# Patient Record
Sex: Female | Born: 1940 | Race: White | Hispanic: No | Marital: Married | Smoking: Former smoker
Health system: Southern US, Community
[De-identification: ages and names within clinical notes are randomized; demographics above are authoritative.]

## PROBLEM LIST (undated history)

## (undated) DIAGNOSIS — D509 Iron deficiency anemia, unspecified: Secondary | ICD-10-CM

## (undated) DIAGNOSIS — T4145XA Adverse effect of unspecified anesthetic, initial encounter: Secondary | ICD-10-CM

## (undated) DIAGNOSIS — T8859XA Other complications of anesthesia, initial encounter: Secondary | ICD-10-CM

## (undated) DIAGNOSIS — K449 Diaphragmatic hernia without obstruction or gangrene: Secondary | ICD-10-CM

## (undated) DIAGNOSIS — K922 Gastrointestinal hemorrhage, unspecified: Secondary | ICD-10-CM

## (undated) DIAGNOSIS — I10 Essential (primary) hypertension: Secondary | ICD-10-CM

## (undated) DIAGNOSIS — J45909 Unspecified asthma, uncomplicated: Secondary | ICD-10-CM

## (undated) DIAGNOSIS — F419 Anxiety disorder, unspecified: Secondary | ICD-10-CM

## (undated) HISTORY — PX: OTHER SURGICAL HISTORY: SHX169

## (undated) HISTORY — PX: BACK SURGERY: SHX140

## (undated) HISTORY — PX: PARATHYROIDECTOMY: SHX19

## (undated) HISTORY — PX: BILATERAL CARPAL TUNNEL RELEASE: SHX6508

## (undated) HISTORY — DX: Essential (primary) hypertension: I10

## (undated) HISTORY — PX: ABDOMINAL HYSTERECTOMY: SHX81

## (undated) HISTORY — PX: KNEE ARTHROSCOPY: SUR90

## (undated) HISTORY — DX: Anxiety disorder, unspecified: F41.9

## (undated) HISTORY — DX: Iron deficiency anemia, unspecified: D50.9

---

## 2002-01-17 HISTORY — PX: BREAST BIOPSY: SHX20

## 2005-03-27 ENCOUNTER — Emergency Department: Payer: Self-pay | Admitting: Emergency Medicine

## 2005-07-07 ENCOUNTER — Other Ambulatory Visit: Payer: Self-pay

## 2005-07-08 ENCOUNTER — Inpatient Hospital Stay: Payer: Self-pay | Admitting: Unknown Physician Specialty

## 2005-07-11 ENCOUNTER — Encounter: Payer: Self-pay | Admitting: Internal Medicine

## 2006-09-30 ENCOUNTER — Inpatient Hospital Stay: Payer: Self-pay | Admitting: Internal Medicine

## 2008-05-13 ENCOUNTER — Emergency Department: Payer: Self-pay | Admitting: Emergency Medicine

## 2009-06-12 ENCOUNTER — Ambulatory Visit: Payer: Self-pay | Admitting: Unknown Physician Specialty

## 2009-06-18 ENCOUNTER — Ambulatory Visit: Payer: Self-pay | Admitting: Unknown Physician Specialty

## 2009-06-20 ENCOUNTER — Ambulatory Visit: Payer: Self-pay | Admitting: Unknown Physician Specialty

## 2009-07-03 ENCOUNTER — Ambulatory Visit: Payer: Self-pay | Admitting: Unknown Physician Specialty

## 2010-06-10 ENCOUNTER — Emergency Department: Payer: Self-pay | Admitting: Emergency Medicine

## 2010-10-29 ENCOUNTER — Ambulatory Visit: Payer: Self-pay

## 2010-11-26 ENCOUNTER — Ambulatory Visit: Payer: Self-pay | Admitting: Pain Medicine

## 2010-12-09 ENCOUNTER — Ambulatory Visit: Payer: Self-pay | Admitting: Pain Medicine

## 2010-12-23 ENCOUNTER — Ambulatory Visit: Payer: Self-pay | Admitting: Pain Medicine

## 2012-02-11 ENCOUNTER — Inpatient Hospital Stay: Payer: Self-pay | Admitting: Internal Medicine

## 2012-02-11 LAB — COMPREHENSIVE METABOLIC PANEL
Alkaline Phosphatase: 77 U/L (ref 50–136)
Anion Gap: 9 (ref 7–16)
BUN: 15 mg/dL (ref 7–18)
Bilirubin,Total: 0.4 mg/dL (ref 0.2–1.0)
Chloride: 109 mmol/L — ABNORMAL HIGH (ref 98–107)
Creatinine: 0.91 mg/dL (ref 0.60–1.30)
EGFR (African American): >60
Osmolality: 287 (ref 275–301)
Potassium: 3.3 mmol/L — ABNORMAL LOW (ref 3.5–5.1)
SGOT(AST): 68 U/L — ABNORMAL HIGH (ref 15–37)
SGPT (ALT): 43 U/L
Sodium: 144 mmol/L (ref 136–145)

## 2012-02-11 LAB — CBC
HCT: 35.6 % (ref 35.0–47.0)
MCH: 30.4 pg (ref 26.0–34.0)
MCHC: 33.6 g/dL (ref 32.0–36.0)
Platelet: 152 10*3/uL (ref 150–440)
RBC: 3.94 10*6/uL (ref 3.80–5.20)
RDW: 14.1 % (ref 11.5–14.5)

## 2012-02-11 LAB — PRO B NATRIURETIC PEPTIDE: B-Type Natriuretic Peptide: 934 pg/mL — ABNORMAL HIGH (ref 0–125)

## 2012-02-11 LAB — CK TOTAL AND CKMB (NOT AT ARMC): CK-MB: 6.4 ng/mL — ABNORMAL HIGH (ref 0.5–3.6)

## 2012-02-11 LAB — TROPONIN I: Troponin-I: <0.02 ng/mL

## 2012-02-13 LAB — CBC WITH DIFFERENTIAL/PLATELET
Basophil #: 0 10*3/uL (ref 0.0–0.1)
Basophil %: 0.1 %
Eosinophil #: 0 10*3/uL (ref 0.0–0.7)
Eosinophil %: 0 %
HGB: 11 g/dL — ABNORMAL LOW (ref 12.0–16.0)
Lymphocyte #: 0.7 10*3/uL — ABNORMAL LOW (ref 1.0–3.6)
Lymphocyte %: 12.3 %
MCH: 29.4 pg (ref 26.0–34.0)
MCHC: 32.5 g/dL (ref 32.0–36.0)
MCV: 91 fL (ref 80–100)
Monocyte %: 5.5 %
Neutrophil #: 4.6 10*3/uL (ref 1.4–6.5)
Neutrophil %: 82.1 %
RBC: 3.75 10*6/uL — ABNORMAL LOW (ref 3.80–5.20)
RDW: 13.9 % (ref 11.5–14.5)
WBC: 5.6 10*3/uL (ref 3.6–11.0)

## 2012-02-13 LAB — BASIC METABOLIC PANEL
BUN: 23 mg/dL — ABNORMAL HIGH (ref 7–18)
Chloride: 108 mmol/L — ABNORMAL HIGH (ref 98–107)
Glucose: 132 mg/dL — ABNORMAL HIGH (ref 65–99)
Osmolality: 291 (ref 275–301)
Potassium: 3.9 mmol/L (ref 3.5–5.1)

## 2012-02-15 LAB — CK-MB: CK-MB: 4.3 ng/mL — ABNORMAL HIGH (ref 0.5–3.6)

## 2012-02-15 LAB — SGOT (AST)(ARMC): SGOT(AST): 34 U/L (ref 15–37)

## 2012-02-17 LAB — CULTURE, BLOOD (SINGLE)

## 2012-02-17 LAB — HEMOGLOBIN A1C

## 2012-02-17 LAB — CREATININE, SERUM
Creatinine: 0.92 mg/dL (ref 0.60–1.30)
EGFR (African American): >60
EGFR (Non-African Amer.): >60

## 2012-02-17 LAB — CK-MB: CK-MB: 3.4 ng/mL (ref 0.5–3.6)

## 2012-02-19 LAB — EXPECTORATED SPUTUM ASSESSMENT W GRAM STAIN, RFLX TO RESP C

## 2012-03-09 ENCOUNTER — Ambulatory Visit: Payer: Self-pay | Admitting: Family Medicine

## 2012-04-05 ENCOUNTER — Ambulatory Visit: Payer: Self-pay | Admitting: Internal Medicine

## 2012-04-28 ENCOUNTER — Ambulatory Visit: Payer: Self-pay | Admitting: General Surgery

## 2012-04-28 HISTORY — PX: COLONOSCOPY: SHX174

## 2012-04-28 HISTORY — PX: UPPER GI ENDOSCOPY: SHX6162

## 2012-10-14 ENCOUNTER — Ambulatory Visit: Payer: Self-pay | Admitting: Family Medicine

## 2012-11-22 ENCOUNTER — Ambulatory Visit: Payer: Self-pay | Admitting: Family Medicine

## 2012-11-30 ENCOUNTER — Ambulatory Visit: Payer: Self-pay | Admitting: Family Medicine

## 2013-03-18 ENCOUNTER — Emergency Department: Payer: Self-pay | Admitting: Emergency Medicine

## 2013-03-18 LAB — BASIC METABOLIC PANEL
Anion Gap: 6 — ABNORMAL LOW (ref 7–16)
BUN: 15 mg/dL (ref 7–18)
Calcium, Total: 9.2 mg/dL (ref 8.5–10.1)
Co2: 27 mmol/L (ref 21–32)
EGFR (African American): >60
EGFR (Non-African Amer.): >60
Glucose: 87 mg/dL (ref 65–99)
Osmolality: 278 (ref 275–301)
Potassium: 4.1 mmol/L (ref 3.5–5.1)
Sodium: 139 mmol/L (ref 136–145)

## 2013-03-18 LAB — CBC
MCH: 30.4 pg (ref 26.0–34.0)
Platelet: 224 10*3/uL (ref 150–440)

## 2013-11-15 ENCOUNTER — Ambulatory Visit: Payer: Self-pay | Admitting: Family Medicine

## 2013-12-13 ENCOUNTER — Emergency Department: Payer: Self-pay | Admitting: Emergency Medicine

## 2013-12-13 LAB — CBC WITH DIFFERENTIAL/PLATELET
Basophil #: 0 10*3/uL (ref 0.0–0.1)
Basophil %: 0.6 %
EOS PCT: 0.4 %
Eosinophil #: 0 10*3/uL (ref 0.0–0.7)
HCT: 41 % (ref 35.0–47.0)
HGB: 14.1 g/dL (ref 12.0–16.0)
LYMPHS ABS: 0.3 10*3/uL — AB (ref 1.0–3.6)
Lymphocyte %: 3.1 %
MCH: 29.7 pg (ref 26.0–34.0)
MCHC: 34.3 g/dL (ref 32.0–36.0)
MCV: 87 fL (ref 80–100)
MONO ABS: 0.2 x10 3/mm (ref 0.2–0.9)
Monocyte %: 2.9 %
NEUTROS ABS: 7.5 10*3/uL — AB (ref 1.4–6.5)
Neutrophil %: 93 %
Platelet: 195 10*3/uL (ref 150–440)
RBC: 4.74 10*6/uL (ref 3.80–5.20)
RDW: 14 % (ref 11.5–14.5)
WBC: 8.1 10*3/uL (ref 3.6–11.0)

## 2013-12-13 LAB — URINALYSIS, COMPLETE
BACTERIA: NONE SEEN
Bilirubin,UR: NEGATIVE
Blood: NEGATIVE
Glucose,UR: NEGATIVE mg/dL (ref 0–75)
Leukocyte Esterase: NEGATIVE
NITRITE: NEGATIVE
PH: 5 (ref 4.5–8.0)
PROTEIN: NEGATIVE
RBC,UR: 3 /HPF (ref 0–5)
Specific Gravity: 1.025 (ref 1.003–1.030)
WBC UR: 1 /HPF (ref 0–5)

## 2013-12-13 LAB — COMPREHENSIVE METABOLIC PANEL
ALBUMIN: 3.9 g/dL (ref 3.4–5.0)
Alkaline Phosphatase: 95 U/L
Anion Gap: 6 — ABNORMAL LOW (ref 7–16)
BUN: 26 mg/dL — AB (ref 7–18)
Bilirubin,Total: 0.9 mg/dL (ref 0.2–1.0)
CALCIUM: 9.1 mg/dL (ref 8.5–10.1)
Chloride: 103 mmol/L (ref 98–107)
Co2: 28 mmol/L (ref 21–32)
Creatinine: 1.06 mg/dL (ref 0.60–1.30)
EGFR (African American): 60 — ABNORMAL LOW
EGFR (Non-African Amer.): 52 — ABNORMAL LOW
Glucose: 115 mg/dL — ABNORMAL HIGH (ref 65–99)
Osmolality: 279 (ref 275–301)
Potassium: 3.9 mmol/L (ref 3.5–5.1)
SGOT(AST): 26 U/L (ref 15–37)
SGPT (ALT): 15 U/L (ref 12–78)
SODIUM: 137 mmol/L (ref 136–145)
TOTAL PROTEIN: 7.8 g/dL (ref 6.4–8.2)

## 2013-12-13 LAB — LIPASE, BLOOD: Lipase: 160 U/L (ref 73–393)

## 2014-02-15 ENCOUNTER — Emergency Department: Payer: Self-pay | Admitting: Emergency Medicine

## 2014-04-12 ENCOUNTER — Emergency Department: Payer: Self-pay | Admitting: Emergency Medicine

## 2014-04-12 LAB — CBC
HCT: 38 % (ref 35.0–47.0)
HGB: 12.5 g/dL (ref 12.0–16.0)
MCH: 26.6 pg (ref 26.0–34.0)
MCHC: 32.8 g/dL (ref 32.0–36.0)
MCV: 81 fL (ref 80–100)
Platelet: 230 10*3/uL (ref 150–440)
RBC: 4.7 10*6/uL (ref 3.80–5.20)
RDW: 16.1 % — AB (ref 11.5–14.5)
WBC: 4.9 10*3/uL (ref 3.6–11.0)

## 2014-04-12 LAB — COMPREHENSIVE METABOLIC PANEL
ALK PHOS: 94 U/L
Albumin: 3.7 g/dL (ref 3.4–5.0)
Anion Gap: 7 (ref 7–16)
BUN: 15 mg/dL (ref 7–18)
Bilirubin,Total: 0.5 mg/dL (ref 0.2–1.0)
CHLORIDE: 106 mmol/L (ref 98–107)
CO2: 28 mmol/L (ref 21–32)
Calcium, Total: 9.3 mg/dL (ref 8.5–10.1)
Creatinine: 0.92 mg/dL (ref 0.60–1.30)
EGFR (Non-African Amer.): >60
GLUCOSE: 90 mg/dL (ref 65–99)
OSMOLALITY: 282 (ref 275–301)
POTASSIUM: 3.7 mmol/L (ref 3.5–5.1)
SGOT(AST): 21 U/L (ref 15–37)
SGPT (ALT): 18 U/L (ref 12–78)
SODIUM: 141 mmol/L (ref 136–145)
Total Protein: 7.6 g/dL (ref 6.4–8.2)

## 2014-04-12 LAB — CK TOTAL AND CKMB (NOT AT ARMC)
CK, TOTAL: 129 U/L
CK-MB: 5.8 ng/mL — ABNORMAL HIGH (ref 0.5–3.6)

## 2014-04-12 LAB — TROPONIN I

## 2014-04-13 DIAGNOSIS — S52513A Displaced fracture of unspecified radial styloid process, initial encounter for closed fracture: Secondary | ICD-10-CM | POA: Insufficient documentation

## 2014-11-23 ENCOUNTER — Emergency Department: Payer: Self-pay | Admitting: Emergency Medicine

## 2015-02-05 ENCOUNTER — Emergency Department: Payer: Self-pay | Admitting: Emergency Medicine

## 2015-02-05 LAB — COMPREHENSIVE METABOLIC PANEL
ALBUMIN: 3.9 g/dL
AST: 21 U/L
Alkaline Phosphatase: 76 U/L
Anion Gap: 10 (ref 7–16)
BUN: 15 mg/dL
Bilirubin,Total: 0.8 mg/dL
CALCIUM: 9 mg/dL
CO2: 23 mmol/L
Chloride: 106 mmol/L
Creatinine: 0.99 mg/dL
EGFR (African American): >60
EGFR (Non-African Amer.): 56 — ABNORMAL LOW
GLUCOSE: 111 mg/dL — AB
Potassium: 3.9 mmol/L
SGPT (ALT): 17 U/L
Sodium: 139 mmol/L
Total Protein: 7.2 g/dL

## 2015-02-05 LAB — URINALYSIS, COMPLETE
BLOOD: NEGATIVE
Bilirubin,UR: NEGATIVE
Glucose,UR: NEGATIVE mg/dL (ref 0–75)
Hyaline Cast: 10
LEUKOCYTE ESTERASE: NEGATIVE
Nitrite: NEGATIVE
PH: 5 (ref 4.5–8.0)
Specific Gravity: 1.024 (ref 1.003–1.030)
Squamous Epithelial: 10
WBC UR: 6 /HPF (ref 0–5)

## 2015-02-05 LAB — TROPONIN I

## 2015-02-05 LAB — MAGNESIUM: Magnesium: 2 mg/dL

## 2015-02-09 ENCOUNTER — Inpatient Hospital Stay
Admit: 2015-02-09 | Disposition: A | Discharge status: Home / Self Care | Payer: Self-pay | Attending: Internal Medicine | Admitting: Internal Medicine

## 2015-02-09 LAB — CBC
HCT: 22.5 % — AB (ref 35.0–47.0)
HGB: 6.2 g/dL — ABNORMAL LOW (ref 12.0–16.0)
MCH: 16 pg — ABNORMAL LOW (ref 26.0–34.0)
MCHC: 27.6 g/dL — ABNORMAL LOW (ref 32.0–36.0)
MCV: 58 fL — AB (ref 80–100)
PLATELETS: 318 10*3/uL (ref 150–440)
RBC: 3.88 10*6/uL (ref 3.80–5.20)
RDW: 20.6 % — ABNORMAL HIGH (ref 11.5–14.5)
WBC: 4.8 10*3/uL (ref 3.6–11.0)

## 2015-02-09 LAB — IRON AND TIBC
Iron Bind.Cap.(Total): 404 (ref 250–450)
Iron Saturation: 1.5
Iron: 6 ug/dL — ABNORMAL LOW
UNBOUND IRON-BIND. CAP.: 398.2

## 2015-02-09 LAB — COMPREHENSIVE METABOLIC PANEL
ALK PHOS: 68 U/L
ALT: 12 U/L — AB
ANION GAP: 7 (ref 7–16)
Albumin: 3.6 g/dL
BUN: 17 mg/dL
Bilirubin,Total: 0.4 mg/dL
CO2: 24 mmol/L
CREATININE: 0.97 mg/dL
Calcium, Total: 9 mg/dL
Chloride: 110 mmol/L
EGFR (African American): >60
EGFR (Non-African Amer.): 57 — ABNORMAL LOW
Glucose: 97 mg/dL
Potassium: 4.2 mmol/L
SGOT(AST): 16 U/L
Sodium: 141 mmol/L
Total Protein: 6.7 g/dL

## 2015-02-09 LAB — URINALYSIS, COMPLETE
BACTERIA: NONE SEEN
Bilirubin,UR: NEGATIVE
Blood: NEGATIVE
Glucose,UR: NEGATIVE mg/dL (ref 0–75)
KETONE: NEGATIVE
NITRITE: NEGATIVE
PROTEIN: NEGATIVE
Ph: 6 (ref 4.5–8.0)
RBC, UR: NONE SEEN /HPF (ref 0–5)
Specific Gravity: 1.011 (ref 1.003–1.030)

## 2015-02-09 LAB — FERRITIN: Ferritin (ARMC): 3 ng/mL — ABNORMAL LOW

## 2015-02-10 DIAGNOSIS — K5730 Diverticulosis of large intestine without perforation or abscess without bleeding: Secondary | ICD-10-CM

## 2015-02-10 LAB — CBC WITH DIFFERENTIAL/PLATELET
BASOS ABS: 0.1 10*3/uL (ref 0.0–0.1)
BASOS PCT: 1.7 %
BASOS PCT: 1.7 %
Basophil #: 0.1 10*3/uL (ref 0.0–0.1)
Basophil #: 0.1 10*3/uL (ref 0.0–0.1)
Basophil %: 2 %
EOS ABS: 0.1 10*3/uL (ref 0.0–0.7)
EOS ABS: 0.1 10*3/uL (ref 0.0–0.7)
EOS ABS: 0.1 10*3/uL (ref 0.0–0.7)
Eosinophil %: 2.3 %
Eosinophil %: 2.9 %
Eosinophil %: 1.9 %
HCT: 24 % — ABNORMAL LOW (ref 35.0–47.0)
HCT: 25.1 % — AB (ref 35.0–47.0)
HCT: 26.7 % — ABNORMAL LOW (ref 35.0–47.0)
HGB: 7.4 g/dL — ABNORMAL LOW (ref 12.0–16.0)
HGB: 7.8 g/dL — AB (ref 12.0–16.0)
HGB: 7.9 g/dL — ABNORMAL LOW (ref 12.0–16.0)
LYMPHS ABS: 1 10*3/uL (ref 1.0–3.6)
LYMPHS ABS: 1.3 10*3/uL (ref 1.0–3.6)
LYMPHS PCT: 24.3 %
Lymphocyte #: 1 10*3/uL (ref 1.0–3.6)
Lymphocyte %: 27.6 %
Lymphocyte %: 25.9 %
MCH: 18.5 pg — ABNORMAL LOW (ref 26.0–34.0)
MCH: 19.2 pg — AB (ref 26.0–34.0)
MCH: 19.4 pg — AB (ref 26.0–34.0)
MCHC: 29.7 g/dL — ABNORMAL LOW (ref 32.0–36.0)
MCHC: 30.6 g/dL — AB (ref 32.0–36.0)
MCHC: 31 g/dL — AB (ref 32.0–36.0)
MCV: 62 fL — ABNORMAL LOW (ref 80–100)
MCV: 63 fL — AB (ref 80–100)
MCV: 63 fL — AB (ref 80–100)
MONO ABS: 0.3 x10 3/mm (ref 0.2–0.9)
MONO ABS: 0.4 x10 3/mm (ref 0.2–0.9)
MONOS PCT: 7 %
Monocyte #: 0.3 x10 3/mm (ref 0.2–0.9)
Monocyte %: 9 %
Monocyte %: 7.9 %
NEUTROS ABS: 2.3 10*3/uL (ref 1.4–6.5)
NEUTROS PCT: 65.1 %
Neutrophil #: 2.9 10*3/uL (ref 1.4–6.5)
Neutrophil #: 2.6 10*3/uL (ref 1.4–6.5)
Neutrophil %: 59.1 %
Neutrophil %: 61.6 %
PLATELETS: 259 10*3/uL (ref 150–440)
Platelet: 261 10*3/uL (ref 150–440)
Platelet: 282 10*3/uL (ref 150–440)
RBC: 3.84 10*6/uL (ref 3.80–5.20)
RBC: 4.01 10*6/uL (ref 3.80–5.20)
RBC: 4.3 10*6/uL (ref 3.80–5.20)
RDW: 27.4 % — ABNORMAL HIGH (ref 11.5–14.5)
RDW: 27.6 % — ABNORMAL HIGH (ref 11.5–14.5)
RDW: 27 % — ABNORMAL HIGH (ref 11.5–14.5)
WBC: 4.9 10*3/uL (ref 3.6–11.0)
WBC: 3.7 10*3/uL (ref 3.6–11.0)
WBC: 4.1 10*3/uL (ref 3.6–11.0)

## 2015-02-10 LAB — BASIC METABOLIC PANEL
Anion Gap: 3 — ABNORMAL LOW (ref 7–16)
BUN: 10 mg/dL
CHLORIDE: 110 mmol/L
CO2: 26 mmol/L
Calcium, Total: 8.5 mg/dL — ABNORMAL LOW
Creatinine: 0.98 mg/dL
EGFR (Non-African Amer.): 56 — ABNORMAL LOW
GLUCOSE: 101 mg/dL — AB
Potassium: 3.8 mmol/L
Sodium: 139 mmol/L

## 2015-02-11 LAB — HEMOGLOBIN: HGB: 8.1 g/dL — ABNORMAL LOW (ref 12.0–16.0)

## 2015-02-12 ENCOUNTER — Encounter: Payer: Self-pay | Admitting: *Deleted

## 2015-02-12 DIAGNOSIS — K5730 Diverticulosis of large intestine without perforation or abscess without bleeding: Secondary | ICD-10-CM | POA: Diagnosis not present

## 2015-02-12 HISTORY — PX: COLONOSCOPY: SHX174

## 2015-02-12 LAB — HEMOGLOBIN: HGB: 9.9 g/dL — ABNORMAL LOW (ref 12.0–16.0)

## 2015-02-13 ENCOUNTER — Ambulatory Visit: Admit: 2015-02-13 | Payer: Self-pay | Admitting: Internal Medicine

## 2015-02-13 LAB — BASIC METABOLIC PANEL WITH GFR
Anion Gap: 7
BUN: 15 mg/dL
Calcium, Total: 8.6 mg/dL — ABNORMAL LOW
Chloride: 104 mmol/L
Co2: 28 mmol/L
Creatinine: 1.12 mg/dL — ABNORMAL HIGH
EGFR (African American): 56 — ABNORMAL LOW
EGFR (Non-African Amer.): 48 — ABNORMAL LOW
Glucose: 90 mg/dL
Potassium: 3.4 mmol/L — ABNORMAL LOW
Sodium: 139 mmol/L

## 2015-02-13 LAB — CBC WITH DIFFERENTIAL/PLATELET
Basophil #: 0 10*3/uL (ref 0.0–0.1)
Basophil %: 1.2 %
EOS PCT: 3.9 %
Eosinophil #: 0.1 10*3/uL (ref 0.0–0.7)
HCT: 25.8 % — ABNORMAL LOW (ref 35.0–47.0)
HGB: 7.7 g/dL — AB (ref 12.0–16.0)
LYMPHS ABS: 1.3 10*3/uL (ref 1.0–3.6)
Lymphocyte %: 37.5 %
MCH: 19 pg — AB (ref 26.0–34.0)
MCHC: 29.8 g/dL — ABNORMAL LOW (ref 32.0–36.0)
MCV: 64 fL — AB (ref 80–100)
MONOS PCT: 8.1 %
Monocyte #: 0.3 x10 3/mm (ref 0.2–0.9)
NEUTROS PCT: 49.3 %
Neutrophil #: 1.8 10*3/uL (ref 1.4–6.5)
Platelet: 260 10*3/uL (ref 150–440)
RBC: 4.03 10*6/uL (ref 3.80–5.20)
RDW: 28.6 % — ABNORMAL HIGH (ref 11.5–14.5)
WBC: 3.6 10*3/uL (ref 3.6–11.0)

## 2015-02-16 ENCOUNTER — Ambulatory Visit
Admit: 2015-02-16 | Disposition: A | Discharge status: Home / Self Care | Payer: Self-pay | Attending: General Surgery | Admitting: General Surgery

## 2015-02-19 ENCOUNTER — Encounter: Payer: Self-pay | Admitting: General Surgery

## 2015-02-20 ENCOUNTER — Encounter: Payer: Self-pay | Admitting: General Surgery

## 2015-02-21 ENCOUNTER — Encounter: Payer: Self-pay | Admitting: General Surgery

## 2015-02-21 ENCOUNTER — Ambulatory Visit (INDEPENDENT_AMBULATORY_CARE_PROVIDER_SITE_OTHER): Payer: Commercial Managed Care - HMO | Admitting: General Surgery

## 2015-02-21 VITALS — BP 126/82 | HR 88 | Resp 16 | Ht 63.0 in | Wt 185.0 lb

## 2015-02-21 DIAGNOSIS — J209 Acute bronchitis, unspecified: Secondary | ICD-10-CM

## 2015-02-21 DIAGNOSIS — D649 Anemia, unspecified: Secondary | ICD-10-CM

## 2015-02-21 MED ORDER — AZITHROMYCIN 250 MG PO TABS: ORAL_TABLET | ORAL | Status: DC

## 2015-02-21 NOTE — Progress Notes (Signed)
Patient ID: Amanda [name], female   DOB: May 17, 1941, 74 y.o.   MRN: [id number]  Chief Complaint  Patient presents with  . Follow-up    HPI Amanda [name] is a 74 y.o. female here today for here follow up from her hospital stay. Patient is having some constipation but dulcolax helped. No nausea or vomiting. Denies abdominal pain. She just feels tired and weak. Denies trouble swallowing. She states she has a productive cough (green) and using mucinex.  HPI  Past Medical History  Diagnosis Date  . Hypertension   . Anxiety     Past Surgical History  Procedure Laterality Date  . Colonoscopy  04/28/2012  . Breast biopsy Left 01/17/2002  . Upper gi endoscopy  04/28/2012  . Colonoscopy  02-12-15    Dr Lemar Livings    No family history on file.  Social History History  Substance Use Topics  . Smoking status: Former Smoker -- 1.00 packs/day for 30 years    Types: Cigarettes  . Smokeless tobacco: Never Used  . Alcohol Use: No    Allergies  Allergen Reactions  . Codeine Nausea And Vomiting  . Penicillins Hives    Current Outpatient Prescriptions  Medication Sig Dispense Refill  . albuterol (PROVENTIL HFA;VENTOLIN HFA) 108 (90 BASE) MCG/ACT inhaler Inhale into the lungs every 6 (six) hours as needed for wheezing or shortness of breath.    . ALPRAZolam (XANAX) 1 MG tablet     . amLODipine (NORVASC) 5 MG tablet     . ascorbic acid (VITAMIN C) 1000 MG tablet Take 1,000 mg by mouth daily.    . carvedilol (COREG) 6.25 MG tablet     . cholecalciferol (VITAMIN D) 1000 UNITS tablet Take 1,000 Units by mouth daily.    . cyclobenzaprine (FLEXERIL) 10 MG tablet Take 10 mg by mouth 3 (three) times daily as needed for muscle spasms.    . diazepam (VALIUM) 5 MG tablet Take 5 mg by mouth 3 (three) times daily.    . ferrous sulfate 325 (65 FE) MG tablet Take 325 mg by mouth daily with breakfast.    . Fluticasone-Salmeterol (ADVAIR) 250-50 MCG/DOSE AEPB Inhale 1 puff into the lungs 2 (two) times daily.     Marland Kitchen HYDROcodone-acetaminophen (NORCO) 10-325 MG per tablet     . nabumetone (RELAFEN) 750 MG tablet     . omeprazole (PRILOSEC) 20 MG capsule     . rOPINIRole (REQUIP) 1 MG tablet     . traMADol (ULTRAM) 50 MG tablet Take 50 mg by mouth every 6 (six) hours as needed.    Marland Kitchen azithromycin (ZITHROMAX) 250 MG tablet Start with 12 tabs today then one tab a day til gone 6 each 0   No current facility-administered medications for this visit.    Review of Systems Review of Systems  Constitutional: Negative.   Respiratory: Positive for cough.   Cardiovascular: Negative.   Gastrointestinal: Positive for constipation. Negative for nausea, vomiting and diarrhea.    Blood pressure 126/82, pulse 88, resp. rate 16, height  (1.6 m), weight 185 lb (83.915 kg).  Physical Exam Physical Exam  Constitutional: She is oriented to person, place, and time. She appears well-developed and well-nourished.  Cardiovascular: Normal rate, regular rhythm and normal heart sounds.   Pulmonary/Chest: Effort normal. She has rhonchi.  Neurological: She is alert and oriented to person, place, and time.  Skin: Skin is warm and dry.    Data Reviewed During her hospitalization she was noted to be profoundly anemic with  a hemoglobin of 6.2 and an MCV of 58. No gross blood.   Colonoscopy was normal.   Upper endoscopy showed erythema of the gastric mucosa above the level of the diaphragm, with approximately 50% of the stomach in this location. No ulcers.  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 No masses.  Assessment    It is likely that the bleeding is from the gastric fundus, but a small bowel source should be eliminated by capsule study.    Plan    Will contact GI to see if there is a risk of the capsule being trapped in the proximal jejunal diverticulum. If not we'll arrange for a capsule study to look for another source for bleeding.  If her insurance will permit, would have her seen by  Effie ShyMichael Tyner, M.D. to discuss hiatal hernia repair. If not we'll need to look for a suitable position within her insurance plan.  Patient to have a CBC drawn today.  With her history of productive cough of green mucus for the last several days, and worsening symptoms on Mucinex, she'll be placed on Zithromax 500 mg to start, 250 mg daily for an additional 4 days.    Capsule study.    PCP:  [place]. [place]   Shah, MayvilleSyed Asad A  Earline MayotteByrnett, Chao Blazejewski W 02/24/2015, 10:09 AM

## 2015-02-21 NOTE — Patient Instructions (Addendum)
The patient is aware to call back for any questions or concerns.  Patient to have labs drawn today.   Capsule study.  Appointment with Dr. Alva Garnetyner

## 2015-02-22 ENCOUNTER — Telehealth: Payer: Self-pay | Admitting: *Deleted

## 2015-02-22 LAB — CBC WITH DIFFERENTIAL/PLATELET
BASOS: 1 %
Basophils Absolute: 0.1 10*3/uL (ref 0.0–0.2)
Eos: 3 %
Eosinophils Absolute: 0.2 10*3/uL (ref 0.0–0.4)
HCT: 31.7 % — ABNORMAL LOW (ref 34.0–46.6)
Hemoglobin: 9.4 g/dL — ABNORMAL LOW (ref 11.1–15.9)
Immature Grans (Abs): 0 10*3/uL (ref 0.0–0.1)
Immature Granulocytes: 0 %
Lymphocytes Absolute: 1.6 10*3/uL (ref 0.7–3.1)
Lymphs: 33 %
MCH: 20.5 pg — ABNORMAL LOW (ref 26.6–33.0)
MCHC: 29.7 g/dL — ABNORMAL LOW (ref 31.5–35.7)
MCV: 69 fL — AB (ref 79–97)
Monocytes Absolute: 0.4 10*3/uL (ref 0.1–0.9)
Monocytes: 8 %
NEUTROS PCT: 55 %
Neutrophils Absolute: 2.8 10*3/uL (ref 1.4–7.0)
Platelets: 311 10*3/uL (ref 150–379)
RBC: 4.59 x10E6/uL (ref 3.77–5.28)
RDW: 30.5 % — AB (ref 12.3–15.4)
WBC: 5 10*3/uL (ref 3.4–10.8)

## 2015-02-22 NOTE — Telephone Encounter (Signed)
Patient called and just wanted to let you know that she got her PCP changed on her insurance [facility]([employer]) to Dr.Little in [place]. She also said it will take about a week for her to receive her new card in the mail.

## 2015-02-24 DIAGNOSIS — J209 Acute bronchitis, unspecified: Secondary | ICD-10-CM | POA: Insufficient documentation

## 2015-02-24 DIAGNOSIS — D649 Anemia, unspecified: Secondary | ICD-10-CM | POA: Insufficient documentation

## 2015-02-26 ENCOUNTER — Telehealth: Payer: Self-pay | Admitting: General Surgery

## 2015-02-26 NOTE — Telephone Encounter (Signed)
The patient was informed of the need to make use of a trial capsule to be sure that the camera does not get hung in the proximal jejunal diverticulum.  Plans at present are for her to come in Wednesday morning, April 20 and she'll be administered the capsule and the following day she'll have a KUB to confirm passage.

## 2015-02-27 NOTE — Telephone Encounter (Signed)
Patient has been informed of plans for capsule endoscopy study to assess for secondary sources of ongoing blood loss. Dummy capsule testing this week, formal capsule endoscopy next week.

## 2015-02-28 ENCOUNTER — Ambulatory Visit (INDEPENDENT_AMBULATORY_CARE_PROVIDER_SITE_OTHER): Payer: Commercial Managed Care - HMO | Admitting: *Deleted

## 2015-02-28 DIAGNOSIS — D649 Anemia, unspecified: Secondary | ICD-10-CM

## 2015-02-28 MED ORDER — POLYETHYLENE GLYCOL 3350 17 GM/SCOOP PO POWD: ORAL | Status: DC

## 2015-02-28 NOTE — Progress Notes (Signed)
Patient has been scheduled for a capsule endoscopy at [facility] on 03-06-15. Prep instructions were reviewed with the patient and she verbalizes understanding. Miralax prescription was sent to patient's pharmacy.   Dummy capsule was swallowed in the office this morning. [facility] to confirm this passed through.   Patient instructed to call the office should she have further questions.

## 2015-02-28 NOTE — Patient Instructions (Signed)
The patient is aware to call back for any questions or concerns.  

## 2015-02-28 NOTE — Progress Notes (Signed)
Reviewed instructions for pill cam trial, pt agrees. She took tablet with water with no complications.

## 2015-03-01 ENCOUNTER — Ambulatory Visit
Admit: 2015-03-01 | Disposition: A | Discharge status: Home / Self Care | Payer: Self-pay | Attending: General Surgery | Admitting: General Surgery

## 2015-03-02 ENCOUNTER — Telehealth: Payer: Self-pay

## 2015-03-02 NOTE — Telephone Encounter (Signed)
-----   Message from Amanda MayotteJeffrey W Byrnett, MD sent at 03/02/2015  7:19 AM EDT ----- Please notify the patient the capsule "dummy pill" passed without difficulty.  She should proceed with capsule endoscopy as scheduled next week.

## 2015-03-02 NOTE — Telephone Encounter (Signed)
Notified patient as instructed, patient pleased. Patient instructed to precede with capsule endoscopy as scheduled.

## 2015-03-04 NOTE — H&P (Signed)
PATIENT NAME:  Amanda SilvanHINSHAW, Shannel MR#:  [id number] DATE OF BIRTH:  10-26-1941  DATE OF ADMISSION:  02/11/2012  PRIMARY CARE PHYSICIAN: Dr. Bethena MidgetMead   CHIEF COMPLAINT: Shortness of breath.   HISTORY OF PRESENT ILLNESS: This is a 74 year old female who five days ago started having cold symptoms with cough, sore throat. She began having worsening shortness of breath yesterday and today it got much worse. She was wheezing. She said she had some fever. She saw her primary care physician. She was given nebulizer treatments there and sent over to the hospital. She was in some mild respiratory distress when she came in. She has had several nebulizers here. She is tachypneic but able to answer in complete sentences.   PAST MEDICAL HISTORY:  1. Restless leg syndrome.  2. Hypertension.  3. Anemia.  4. History of panic attacks.  5. Depression.  6. Hyperlipidemia.   PAST SURGICAL HISTORY:  1. Left knee surgery. 2. Neck and lumbar surgery. 3. Thyroidectomy.  4. Hysterectomy.   ALLERGIES: Codeine and penicillin.   CURRENT MEDICATIONS:  1. Requip 2 mg at bedtime.  2. Soma 350 mg b.i.d.  3. Carvedilol 6.25 mg b.i.d.  4. Iron 325 mg daily.  5. Rocaltrol 0.5 mg on Monday, Wednesday, Friday.  6. Simvastatin 40 mg daily.  7. Mirapex 0.25 mg daily.  8. Nabumetone 750 mg b.i.d.  9. Alprazolam 1 mg t.i.d. p.r.n.  10. Norco 7.5/325 b.i.d. p.r.n.   SOCIAL HISTORY: Stopped smoking 35 years ago. Does not drink alcohol.   FAMILY HISTORY: Significant for hypertension and cancer.   REVIEW OF SYSTEMS: CONSTITUTIONAL: She has had fever. EYES: No blurred vision. ENT: No hearing loss. CARDIOVASCULAR: No chest pain. PULMONARY: Some shortness of breath and wheezing. GASTROINTESTINAL: No nausea, vomiting, or diarrhea. GENITOURINARY: No dysuria. ENDOCRINE: No heat or cold intolerance. INTEGUMENT: No rash. MUSCULOSKELETAL: Occasional joint pain. NEUROLOGIC: No numbness or weakness.   PHYSICAL EXAMINATION:  VITAL SIGNS:  Temperature 97.2, pulse 73, respiration 29, blood pressure 174/79, pulse oximetry 100% on 2 liters.   GENERAL: This is a mildly obese white female in mild respiratory distress.   HEENT: Pupils are equal, round, reactive to light. Sclerae nonicteric. Oral mucosa is moist. Oropharynx is clear. Nasopharynx is clear.   NECK: Supple. No JVD, lymphadenopathy. No thyromegaly.   CARDIOVASCULAR: Regular rate and rhythm. No murmurs, rubs, or gallops.   LUNGS: There are coarse scattered rhonchi. No dullness to percussion. She is using accessory muscles. There is some expiratory wheezing.   ABDOMEN: Soft, nontender, nondistended. Bowel sounds are positive. No hepatosplenomegaly. No masses.   EXTREMITIES: There is no edema. No joint deformity.   NEUROLOGIC: Cranial nerves II through XII are intact. She is alert and oriented x4.   SKIN: Moist with no rash.   LABORATORY, DIAGNOSTIC AND RADIOLOGICAL DATA: Chest x-ray shows no infiltrates, some borderline cardiomegaly. BNP 934, white blood cells 3.1, hemoglobin 12, BUN 15, creatinine 0.91, sodium 144, potassium 3.3.   ASSESSMENT AND PLAN:  1. Acute respiratory failure. I suspect it is secondary to acute bronchitis. Giving her nebulizer treatments which seem to be helping some but she is still very tachypneic. Going to give her supportive oxygen and treat underlying cause.  2. Acute bronchitis. Will give her IV antibiotics. Get sputum cultures. Give her frequent nebulizers. I am going to go ahead and give her one dose of IV steroids.  3. Hypokalemia. Will replete this.  4. Elevated BNP. She has borderline cardiomegaly on chest x-ray, no heart failure on chest  x-ray bone but I am going to go ahead and get an echocardiogram.   TIME SPENT ON ADMISSION: 45 minutes.   ____________________________ Gracelyn Nurse, MD jdj:cms D: 02/11/2012 19:22:00 ET T: 02/12/2012 06:41:15 ET JOB#: [id number]  cc: Gracelyn Nurse, MD, <Dictator> Mickel Crow, MD Gracelyn Nurse MD ELECTRONICALLY SIGNED 02/15/2012 23:58

## 2015-03-04 NOTE — Discharge Summary (Signed)
PATIENT NAME:  Amanda SilvanHINSHAW, Kyelle MR#:  [id number] DATE OF BIRTH:  [birth year]  DATE OF ADMISSION:  02/11/2012 DATE OF DISCHARGE:  02/17/2012  PRIMARY CARE PHYSICIAN: Dr. Bethena MidgetMead  REASON FOR ADMISSION: Shortness of breath.   DISCHARGE DIAGNOSES:  1. Acute hypoxic respiratory failure. 2. Acute bronchitis. 3. Reactive airway disease. 4. Heavy growth of yeast noted in sputum, could be colonization, however, from use of steroids and antibiotics. Patient also notes excessive mold in her house.   5. Pulmonary nodules by chest computed tomography.  6. Rhabdomyolysis. 7. Hypertension.  8. History of hyperlipidemia.  9. History of restless leg syndrome. 10. History of depression.  11. History of panic attacks. 12. History of back pain.  13. History of hypertension.  14. Mild diastolic dysfunction noted on two dimensional echocardiogram 02/12/2012 with normal left ventricular ejection fraction.  15. History of anemia.   CONSULTS: None.   DISCHARGE DISPOSITION: Home.    DISCHARGE MEDICATIONS:  1. Prednisone taper as written on prescription.  2. Advair Diskus 250/50, 1 dose inhaled b.i.d.  3. Albuterol metered dose inhaler 1 to 2 puffs inhaled q.4-6h. p.r.n. shortness of breath or wheezing.  4. Norvasc 2.5 mg p.o. daily. 5. Diflucan 100 mg p.o. daily x5 days. 6. Soma 350 mg p.o. every 12 hours p.r.n.  7. Coreg 6.25 mg p.o. daily.  8. Soma 350 mg p.o. every 12 hours p.r.n.  9. Iron 325 mg p.o. daily.  10. Requip 2 mg p.o. daily.  11. Rocaltrol 0.5 mcg p.o. daily on Monday, Wednesday, and Fridays. 12. Zocor 40 mg daily. 13. Vitamin C 1000 mg daily.  14. Vitamin D3 1000 international units daily. 15. Mirapex 0.25 mg p.o. daily.   DISCHARGE CONDITION: Improved, stable.   DISCHARGE ACTIVITY: As tolerated.   DISCHARGE DIET: Low sodium, low fat, low cholesterol.   DISCHARGE INSTRUCTIONS:  1. Take medications as prescribed.  2. Return to Emergency Department for recurrence of symptoms or for  worsening shortness of breath, cough, wheezing, fever, or chills.   FOLLOW UP INSTRUCTIONS: Follow up with Dr. Bethena MidgetMead within 1 to 2 weeks. Patient needs repeat blood pressure check within one week.   REFERRALS: Patient being referred to Dr. Freda MunroSaadat Khan of pulmonology within 1 to 2 weeks for reactive airway disease with wheezing as well as pulmonary nodules and for testing of mold.   LABORATORY, DIAGNOSTIC AND RADIOLOGICAL DATA:  Chest x-ray PA and lateral 02/11/2012: Hiatal hernia, small to moderate size. Borderline cardiomegaly. Atherosclerotic disease. Chest CT with PE protocol 02/15/2012: No pulmonary embolus identified. Small focus of air within the main pulmonary artery likely related to the IV. Multiple indeterminate pulmonary nodules, largest of which measures 4 mm.   2-D echocardiogram 02/12/2012: Normal chamber size. Normal LV systolic function but mild diastolic dysfunction.   Blood cultures x2 02/11/2012: No growth to date.  Sputum culture 02/14/2012: Moderate growth of group G streptococcus which has no known penicillin resistance, also heavy growth of Candida albicans.   EKG on admission: Normal sinus rhythm, heart rate 76 beats per minute, normal axis, normal intervals, no acute ST or T wave changes.   CBC on admission normal except for WBC slightly low at 3.1. WBC is normal at 5.6 from 02/13/2012. CK-MB 6.4 on admission with normal troponin. Second CK-MB 4.3, third CK-MB was normal at 3.4.   AST was 68 on admission and 34 from 02/15/2012. Otherwise LFTs are within normal limits.   Renal function normal on admission.   Serum potassium 3.3 on admission and 3.9  from 02/13/2012.   Hemoglobin A1c 5.5.   BRIEF HISTORY/HOSPITAL COURSE: Patient is a pleasant 74 year old female with past medical history of hypertension, hyperlipidemia, restless leg syndrome, depression, history of panic attacks and back pain presented to the Emergency Department with complaints of shortness of breath  and cough productive of green sputum as well as wheezing. Please see dictated admission history and physical for pertinent details surrounding the onset of this hospitalization and please see below for further details.  1. Acute hypoxic respiratory failure. Patient had moderate respiratory distress at the time of admission and was noted to be hypoxic on room air and chest x-ray did not reveal any acute cardiopulmonary abnormalities. She did have tight breath sounds and significant wheezing. She was felt to have acute hypoxic respiratory failure from acute bronchitis leading to reactive airway disease with subsequent wheezing. She was admitted to the medical floor and maintained on supplemental oxygen via nasal cannula which was titrated to keep her sats above 90%. For her wheezing she was started on IV steroids and for bronchitis after blood and sputum cultures were obtained she was empirically started on IV antibiotics. She was also maintained on bronchodilator support with scheduled nebs as well as p.r.n. albuterol metered dose inhaler and was also started on Advair Diskus. She states she has never been a smoker. She states she has been exposed to excessive amounts of mold in her house and her husband has also been exposed to the same mold but he has not had any symptoms. CT of the chest was obtained and revealed subcentimeter pulmonary nodules which will need surveillance imaging as an outpatient and patient will be referred to pulmonologist, Dr. Welton Flakes, in this regard. With the measures mentioned above, patient's overall clinical condition has significantly improved. She has since been weaned off oxygen with significant improvement of her shortness of breath as well as her wheezing and her cough has also improved. On the day of discharge she was not significantly short of breath and she had no shortness breath at rest or with ambulation and her oxygen saturations remained normal with both. Sputum culture revealed  growth of group G strep and she has received a seven day course of antibiotics with ceftriaxone and she is also on Zithromax to which the organism should have been susceptible. There was also noted growth of heavy yeast and Candida albicans and not sure if this represents colonization but she was given a one-week course of an antifungal until she follows up with pulmonologist, Dr. Welton Flakes. She will also be tested at Dr. Milta Deiters office for mold as per her request given presence of mold in her house. Once her condition was noted to have improved, she was transitioned off IV steroids to oral prednisone taper which she will complete as an outpatient.  2. She was noted to have elevated CK levels. Not sure if this is rhabdomyolysis versus due to increased work of breathing versus statin induced. CK-MB has normalized with supportive measures and IV fluids and she is without any myalgias. She also had minimally elevated AST but suspect this is more muscular in etiology given her elevated CK-MB levels rather than truly abnormal liver function tests as her ALT was normal and she denied any abdominal pain, nausea and vomiting. As above, CK-MB and AST levels have now normalized. 3. Hypertension. She did have some episodes of uncontrolled hypertension during this hospitalization which was not well controlled despite her usual Coreg therefore Norvasc has been added to her regimen. After doing  so her blood pressure is now well controlled and she will need close outpatient follow up with her primary care physician in regards to hypertension management.  4. Hyperlipidemia. Patient to continue statin. Her AST and CK-MB levels have now normalized. 5. Restless leg syndrome. Patient to continue Mirapex and Requip.  6. Chronic pain syndrome, stable. Patient to continue p.r.n. soma and p.r.n. Norco and I have advised her to hold nabumetone while she is on steroids and non-steroidal anti-inflammatory medications with steroids cause  gastritis and she was in agreement with this plan.  7. History of panic attacks, anxiety, depression. Patient to continue Xanax p.r.n. for panic attacks and anxiety. She is not on any antidepressants at baseline and she has denied feeling depressed during this hospitalization, denied any suicidal or homicidal ideations.  8. Hyperglycemia, felt to be steroid induced. There is no evidence of diabetes at this time. Her hemoglobin A1c is 5.5 and her sugars should continue to improve as her steroids are being tapered. 9. History of anemia. This has remained relatively stable. There was some drop in her hemoglobin and hematocrit and this was suspected to be dilutional from IV fluids and she has remained hemodynamically stable and had no indications for blood transfusion during this hospitalization and she will also continue iron supplement as before and will need close monitoring of her anemia as an outpatient by her primary care physician.  10. On 02/17/2012 patient was hemodynamically stable and without any shortness of breath or wheezing and her cough had significantly improved and she was felt to be stable for discharge home with close outpatient follow up to which patient was agreeable.   TIME SPENT ON DISCHARGE: Greater than 30 minutes.  ____________________________ Elon Alas, MD knl:cms D: 02/20/2012 14:39:29 ET T: 02/23/2012 09:14:16 ET JOB#: [id number]  cc: Elon Alas, MD, <Dictator> Mickel Crow, MD Yevonne Pax, MD Elon Alas MD ELECTRONICALLY SIGNED 03/02/2012 17:50

## 2015-03-06 ENCOUNTER — Ambulatory Visit
Admit: 2015-03-06 | Disposition: A | Discharge status: Home / Self Care | Payer: Self-pay | Attending: General Surgery | Admitting: General Surgery

## 2015-03-07 ENCOUNTER — Encounter: Payer: Self-pay | Admitting: General Surgery

## 2015-03-11 NOTE — H&P (Signed)
PATIENT NAME:  Amanda [name], Amanda [name] MR#:  [id number] DATE OF BIRTH:  April 21, 1941  DATE OF ADMISSION:  02/09/2015  PRIMARY CARE PHYSICIAN: Winn Jock. Lovenia Kim., MD    REFERRING EMERGENCY ROOM PHYSICIAN: Randon Goldsmith. Lord, MD   CHIEF COMPLAINT: Headache, weakness, diarrhea.   HISTORY OF PRESENT ILLNESS: This very pleasant 74 year old woman with past medical history of hypertension, diverticulosis, and hiatal hernia seen on last endoscopies in 2013, anxiety, depression, and gastroesophageal reflux disease, presents today with 2 weeks of diarrhea, with progressive headaches and fatigue. She reports that she has had 3-10 bowel movements per day; all have been dark, black and watery. She has had mild abdominal cramping. No significant abdominal pain. She has had some nausea, no vomiting. She has not seen any bright red blood in her stool. She has been short of breath with exertion. She has not had any chest pain or edema. She has had some presyncopal episodes, but no syncope. She has also noticed increasing symptoms of reflux. On presentation to the Emergency Room, her stool is guaiac-positive and her hemoglobin is found to be 6.2. Hospitalist services are admitting for GI bleed. She actually presented to the Emergency Room on March 28 with abdominal pain and was discharged at that time.  PAST MEDICAL HISTORY: 1. Hypertension.  2. Diverticulosis seen on colonoscopy, 2013.  3. Hiatal hernia.  4. Anxiety and depression.  5. Restless leg syndrome.  6. Gastroesophageal reflux disease.   PAST SURGICAL HISTORY: 1. Partial hysterectomy.  2. Thyroidectomy.  3. Lumbar decompression.  4. Neck surgery.  5. Left knee arthroscopy.    SOCIAL HISTORY: She lives with her husband. She does not use a cane, walker, or assistive devices. She does not smoke cigarettes, drink alcohol, or use illicit substances.   FAMILY MEDICAL HISTORY: Positive for coronary artery disease in her brother, who also had scleroderma. Positive  for stroke in her mother. She has  1 brother with colon cancer, 1 brother with breast cancer, and her father had bladder cancer   REVIEW OF SYSTEMS: CONSTITUTIONAL: Positive for fatigue and weakness, no change in weight.  HEENT: No change in vision or hearing. No pain in eyes or ears. No sore throat or difficulty swallowing.  RESPIRATORY: Positive for shortness of breath with exertion. No coughing, wheezing, pleuritic-type chest pain.  CARDIOVASCULAR: No chest pain, edema, orthopnea, or palpitations. Positive for presyncope.  GASTROINTESTINAL: As above. She has had nausea, no vomiting. She has had diarrhea with dark black stools. No hematochezia. Mild abdominal cramping. No significant abdominal pain.  GENITOURINARY: No dysuria or frequency.  MUSCULOSKELETAL: No swollen tender joints, myalgias, or new pains in the neck, back, knees, shoulders, or hips.  SKIN: No new rashes or lesions. She does have a large bruise over the left forearm from attempted IV placement.  NEUROLOGIC: No focal numbness or weakness. No confusion or dysarthria. No seizure history or dementia. She has had a headache for the past few weeks.  PSYCHIATRIC: No bipolar disorder or schizophrenia.   PHYSICAL EXAMINATION: VITAL SIGNS: Temperature 98.6, pulse 90, respirations 16, blood pressure 150/72, oxygenation 97% on room air.  HEENT: Pupils equal, round, and reactive to light. Conjunctivae are pale, extraocular motion is intact. Oral mucous membranes are pink and moist. Posterior oropharynx is clear. She has upper dentures and lower teeth.  NECK: Supple, trachea midline. No cervical lymphadenopathy.  RESPIRATORY: Lungs are clear to auscultation bilaterally with good air movement.  CARDIOVASCULAR: Regular rate and rhythm. No murmurs, rubs, or gallops. No peripheral edema.  Peripheral pulses 2+. No JVD or bruit.   ABDOMEN: Soft, nontender, nondistended. She reports mild cramping in the lower abdominal quadrants, though I do not  elicit any tenderness on my examination. No guarding, no rebound. Bowel sounds are normal. No hepatosplenomegaly or mass noted.  SKIN: No rashes, lesions, or open wounds.  MUSCULOSKELETAL: No joint effusions. Range of motion normal. Strength 5/5 throughout.  NEUROLOGIC: Cranial nerves II through XII are grossly intact. Strength and sensation intact, nonfocal.  PSYCHIATRIC: She does seem sleepy or depressed, possibly fatigued due to anemia. No signs of uncontrolled anxiety. She is alert and oriented with good insight into her clinical condition.   LABORATORY DATA: Sodium 141, potassium 4.2, chloride 110, bicarbonate 24, BUN 17, creatinine 0.97, glucose 97, calcium is 9.8. LFTs are normal. White blood cells 4.8, hemoglobin 6.2, platelets 318,000, and MCV is 58. Stool is guaiac-positive. EKG is normal sinus rhythm.   IMAGING:  1. CT scan of the abdomen and pelvis with contrast shows chronic changes without acute abnormality. There are diffuse diverticular changes, no diverticulitis.  2. Abdominal ultrasound, which was performed March 28, shows no explanation for abdominal pain.     ASSESSMENT AND PLAN: 1. Gastrointestinal bleed: Stool is guaiac-positive. She has been having diarrhea. No hematochezia. She has no current bleeding on my examination. I suspect this is likely a diverticular bleed. I have discussed this with Dr. Lemar LivingsByrnett, who performed her last colonoscopy and EGD in 2013. He will see the patient in the morning. She is hemodynamically stable at this time and will be admitted to the medical surgical unit. She will likely need a repeat a colonoscopy in the near future, as she does have a sibling with colon cancer and will need further evaluation.  2. Anemia: This is likely acute on chronic anemia. Her MCV is low. We will check iron and ferritin to assess for iron deficiency. We will transfuse 2 units now. We will continue to check CBC q. 6 hours. She is hemodynamically stable and not currently  bleeding at this time. She is symptomatic with fatigue. No chest pain to suggest cardiac strain from this anemia.  3. Hypertension: Blood pressure is a well-controlled on her home regimen, and I will continue with Carvedilol, amlodipine.  4. Gastroesophageal reflux disease. We will continue PPI.  5. Anxiety and depression: Continue home regimen.  6. Prophylaxis. No pharmacological deep vein thrombosis prophylaxis due to bleeding. Protonix for gastrointestinal prophylaxis.    CODE STATUS: The patient is a full code.   TIME SPENT ON ADMISSION: 45 minutes. Thank you for     ____________________________ Ena Dawleyatherine P. Clent RidgesWalsh, MD cpw:mw D: 02/09/2015 15:42:46 ET T: 02/09/2015 15:55:04 ET JOB#: [id number]  cc: Santina Evansatherine P. Clent RidgesWalsh, MD, <Dictator> Gale JourneyATHERINE P WALSH MD ELECTRONICALLY SIGNED 02/17/2015 14:38

## 2015-03-11 NOTE — Discharge Summary (Signed)
PATIENT NAME:  Amanda [name], Amanda [name] MR#:  [id number] DATE OF BIRTH:  Jan 02, 1941  DATE OF ADMISSION:  02/09/2015 DATE OF DISCHARGE:  02/13/2015  DISCHARGE DIAGNOSES: 1.  Moderate gastritis.  2.  Acute on chronic blood loss and iron deficiency anemia status post 2 units of packed red blood cell transfusion.   SECONDARY DIAGNOSES:  1. Hypertension.  2. Diverticulosis seen on colonoscopy, 2013.  3. Hiatal hernia.  4. Anxiety and depression.  5. Restless leg syndrome.  6.   Gastroesophageal reflux disease.   CONSULTATIONS:  1.  Surgery, Dr. Donnalee Curry.  2.  Oncology, Dr. Benita Gutter.   PROCEDURES AND RADIOLOGY:  1.  EGD and colonoscopy by Dr. Lemar Livings on April 4 showed moderate gastritis in the gastric pouch above the diaphragm. No active bleeding. Extensive sigmoid diverticulosis.  2.  CT scan of the abdomen and pelvis with contrast on April 1 showed chronic changes without any acute abnormality.   MAJOR LABORATORY PANEL: UA on admission was negative.   HISTORY AND SHORT HOSPITAL COURSE: The patient is a 74 year old female with the above-mentioned medical problems who was admitted for headache, weakness, and diarrhea. Was found to have acute on chronic blood loss anemia, likely GI in nature. Please see Dr. Jerrye Noble dictated history and physical for further details. Surgical consultation was obtained with Dr. Donnalee Curry as he knew the patient very well. He recommended upper and lower endoscopy, which was performed on April 4 and showed moderate gastritis without any active signs of bleeding. She also had extensive sigmoid diverticulosis. No other source of bleeding was identified. The patient was also evaluated by oncology, Dr. Benita Gutter, who recommended iron supplement as the patient was found to have slow blood loss and iron deficiency anemia. The patient was feeling much better by April 5. She did require 2 units of packed red blood cell transfusion during the  hospitalization. Her hemoglobin remained stable during inpatient stay after 2 units of transfusion. On the date of discharge, her hemoglobin was 7.7 and was discharged home in stable condition.   On the date of discharge her vital signs were as follows: Temperature 98, heart rate 68 per minute, respirations 18 per minute, blood pressure 136/78. She is saturating 92% on room air.   PERTINENT PHYSICAL EXAMINATION ON THE DATE OF DISCHARGE:  CARDIOVASCULAR: S1, S2 normal. No murmurs, rales, or gallop.  LUNGS: Clear to auscultation bilaterally. No wheezing, rales, rhonchi, crepitation.  ABDOMEN: Soft, benign.  NEUROLOGIC: Nonfocal examination. All other physical examination remained at baseline.   DISCHARGE MEDICATIONS:  Medication Instructions  omeprazole 20 mg oral delayed release capsule  1 cap(s) orally once a day   nabumetone 750 mg oral tablet  1 tab(s) orally 2 times a day with food   acetaminophen-hydrocodone 325 mg-10 mg oral tablet  1 tab(s) orally every 8 hours   cyclobenzaprine 10 mg oral tablet  1 tab(s) orally once a day, As Needed for muscle spasms   alprazolam 1 mg oral tablet  1 tab(s) orally 3 times a day   ropinirole 1 mg oral tablet  1 tab(s) orally once a day (at bedtime)   dicyclomine 20 mg oral tablet  1 tab(s) orally every 8 hours, As Needed - for Diarrhea   ondansetron 4 mg oral tablet, disintegrating  1 tab(s) orally every 4 hours   amlodipine 5 mg oral tablet  1 tab(s) orally once a day   ferrous sulfate 325 mg (65 mg elemental iron) oral tablet  1 tab(s) orally  2 times a day (with meals)    DISCHARGE DIET: Regular.   DISCHARGE ACTIVITY: As tolerated.   DISCHARGE INSTRUCTIONS AND FOLLOWUP: The patient was instructed to follow up with her primary care physician, Dr. Aida PufferJames Little in 4 to 6 weeks. She will need followup with Dr. Donnalee CurryJeffrey Byrnett from surgery in 1 to 2 weeks and with Dr. Benita Gutterobert Gittin from oncology in 2 to 4 weeks. She remains at high risk for  readmission.   TOTAL TIME DISCHARGING THIS PATIENT: 45 minutes.    ____________________________ Ellamae SiaVipul S. Sherryll BurgerShah, MD vss:at D: 02/14/2015 15:55:00 ET T: 02/14/2015 16:07:20 ET JOB#: [id number]  cc: Leilani Cespedes S. Sherryll BurgerShah, MD, <Dictator> Winn JockJames C. Lovenia KimLittle, Jr., MD Knute Neuobert G. Lorre NickGittin, MD Earline MayotteJeffrey W. Byrnett, MD Patricia PesaVIPUL S Belen Zwahlen MD ELECTRONICALLY SIGNED 02/14/2015 18:10

## 2015-03-11 NOTE — Consult Note (Signed)
Chief Complaint:  Subjective/Chief Complaint no complaints, no abdo pain, no diarrhea no cp no sob   VITAL SIGNS/ANCILLARY NOTES: **Vital Signs.:   03-Apr-16 13:06  Vital Signs Type Routine  Temperature Temperature (F) 98.2  Celsius 36.7  Temperature Source oral  Pulse Pulse 82  Respirations Respirations 18  Systolic BP Systolic BP 591  Diastolic BP (mmHg) Diastolic BP (mmHg) 75  Mean BP 101  Pulse Ox % Pulse Ox % 97  Pulse Ox Activity Level  At rest  Oxygen Delivery Room Air/ 21 %   Brief Assessment:  GEN well developed   Gastrointestinal details normal Soft  Nontender   Additional Physical Exam slight pallor neuro non focal   Lab Results: Routine Chem:  02-Apr-16 06:54   Glucose, Serum  101 (65-99 NOTE: New Reference Range  01/16/15)  BUN 10 (6-20 NOTE: New Reference Range  01/16/15)  Creatinine (comp) 0.98 (0.44-1.00 NOTE: New Reference Range  01/16/15)  Sodium, Serum 139 (135-145 NOTE: New Reference Range  01/16/15)  Potassium, Serum 3.8 (3.5-5.1 NOTE: New Reference Range  01/16/15)  Chloride, Serum 110 (101-111 NOTE: New Reference Range  01/16/15)  CO2, Serum 26 (22-32 NOTE: New Reference Range  01/16/15)  Calcium (Total), Serum  8.5 (8.9-10.3 NOTE: New Reference Range  01/16/15)  Anion Gap  3  eGFR (African American) >60  eGFR (Non-African American)  56 (eGFR values <98m/min/1.73 m2 may be an indication of chronic kidney disease (CKD). Calculated eGFR is useful in patients with stable renal function. The eGFR calculation will not be reliable in acutely ill patients when serum creatinine is changing rapidly. It is not useful in patients on dialysis. The eGFR calculation may not be applicable to patients at the low and high extremes of body sizes, pregnant women, and vegetarians.)  Routine Hem:  02-Apr-16 00:37   WBC (CBC) 4.9  RBC (CBC) 3.84  Hemoglobin (CBC)  7.4  Hematocrit (CBC)  24.0  Platelet Count (CBC) 259  MCV  63  MCH  19.2   MCHC  30.6  RDW  27.4  Neutrophil % 59.1  Lymphocyte % 27.6  Monocyte % 9.0  Eosinophil % 2.3  Basophil % 2.0  Neutrophil # 2.9  Lymphocyte # 1.3  Monocyte # 0.4  Eosinophil # 0.1  Basophil # 0.1 (Result(s) reported on 10 Feb 2015 at 01:00AM.)    06:54   WBC (CBC) 3.7  RBC (CBC) 4.01  Hemoglobin (CBC)  7.8  Hematocrit (CBC)  25.1  Platelet Count (CBC) 261  MCV  63  MCH  19.4  MCHC  31.0  RDW  27.6  Neutrophil % 61.6  Lymphocyte % 25.9  Monocyte % 7.9  Eosinophil % 2.9  Basophil % 1.7  Neutrophil # 2.3  Lymphocyte # 1.0  Monocyte # 0.3  Eosinophil # 0.1  Basophil # 0.1 (Result(s) reported on 10 Feb 2015 at 07:21AM.)    12:11   WBC (CBC) 4.1  RBC (CBC) 4.30  Hemoglobin (CBC)  7.9  Hematocrit (CBC)  26.7  Platelet Count (CBC) 282  MCV  62  MCH  18.5  MCHC  29.7  RDW  27.0  Neutrophil % 65.1  Lymphocyte % 24.3  Monocyte % 7.0  Eosinophil % 1.9  Basophil % 1.7  Neutrophil # 2.6  Lymphocyte # 1.0  Monocyte # 0.3  Eosinophil # 0.1  Basophil # 0.1 (Result(s) reported on 10 Feb 2015 at 12:26PM.)   Radiology Results: CT:    01-Apr-16 13:53, CT Abdomen and Pelvis With Contrast  CT Abdomen and Pelvis With Contrast   REASON FOR EXAM:    (1) nausea, cramps, loose stool for 2 weeks; (2)   nausea, cramps, loose stool for  COMMENTS:   May transport without cardiac monitor    PROCEDURE: CT  - CT ABDOMEN / PELVIS  W  - Feb 09 2015  1:53PM     CLINICAL DATA:  Nausea    EXAM:  CT ABDOMEN AND PELVIS WITH CONTRAST    TECHNIQUE:  Multidetector CT imaging of the abdomen and pelvis was performed  using the standard protocol following bolus administration of  intravenous contrast.  CONTRAST:  100 mL Omnipaque 350.    COMPARISON:  02/05/2015, 02/15/2012    FINDINGS:  Lung bases are well aerated bilaterally. Mild scarring is noted. A  few scattered nodular densities are seen. When compared with the  prior exam (2013) they are roughly stable in  appearance.    Large hiatalhernia is identified.    The liver, gallbladder, spleen, adrenal glands and pancreas are all  normal in their CT appearance with the exception of a small hepatic  cyst. Kidneys are well visualized bilaterally and reveal no renal  calculi or obstructive changes. Delayed images through the kidneys  show normal excretion of contrast material. Normal enhancement is  noted.    Aortoiliac calcifications are seen without aneurysmal dilatation.  The appendix is air-filled and within normal limits. Diffuse  diverticular change is seen without diverticulitis. The uterus has  been surgically removed. The bladder is well distended. No  significant lymphadenopathy is noted. Bony structures are within  normal limits.     IMPRESSION:  Chronic changes without acute abnormality.      Electronically Signed    By: Inez Catalina M.D.    On: 02/09/2015 13:59         Verified By: Everlene Farrier, M.D.,   Assessment/Plan:  Assessment/Plan:  Assessment initially seen yesterday 4/2, and discussed with medicine DR PATEL and surgery DR BYRNETT.  Holdong po iron until after luminal study. Then po iron, then f/u 1 weeki in cancer center, IV iron prn if faikure of po iron to nnormally reconstitute hemoglobin, was not indicated acutely as hgb stablre, vss, and po iron likely to be effective. i ordered a hgb for today and AM tomorrow   Electronic Signatures: Dallas Schimke (MD)  (Signed 03-Apr-16 13:25)  Authored: Chief Complaint, VITAL SIGNS/ANCILLARY NOTES, Brief Assessment, Lab Results, Radiology Results, Assessment/Plan   Last Updated: 03-Apr-16 13:25 by Dallas Schimke (MD)

## 2015-03-11 NOTE — Consult Note (Signed)
PATIENT NAME:  Amanda [name], Amanda [name] MR#:  [id number] DATE OF BIRTH:  [birth year]  DATE OF CONSULTATION:  02/10/2015  REQUESTING PHYSICIAN:  Elby Showersatherine Walsh, MD  CONSULTING PHYSICIAN:  Earline MayotteJeffrey W. Byrnett, MD  PRIMARY CARE PHYSICIAN:  Winn JockJames C. Little, MD   INDICATION FOR CONSULTATION: Profound microcytic anemia, heme-positive stools.   CLINICAL NOTE: This 74 year old woman was in her usual health until 2 weeks ago when she developed diarrhea. She reported up to 8 to 10 watery stools per day. Only in the last 3 days has there been some mild associated nausea.  The patient came to the Emergency Department on 02/05/2015 and at that time evaluation was nonspecific.  She did undergo an abdominal ultrasound during that visit, which was unremarkable for cholelithiasis. Laboratory studies were drawn, but the CBC was clotted and not repeated. Her electrolytes were notable for normal creatinine of 1.0 with an estimated GFR of 56.  Liver function studies were normal. Random blood sugar was 111. She was discharged and returned on 02/09/2015 with progressive symptoms of diarrhea.  During the second visit, a rectal exam was completed and heme-positive stool reported. At this time her CBC was obtained with identification of a hemoglobin of 6.2, prompting her admission.   The patient had a long history of a large hiatal hernia, but reported no increase in reflux symptoms of late.  She continues on a PPI with good relief and no reported dysphagia. She has not noticed any difficulty with swallowing any foods and is not aware of regurgitation. The patient denied seeing any black stools as she had at the time of a 2013 evaluation.   The patient has been active and maintains her own home with her husband and had not experienced any difficulty with physical activity until the onset of her diarrhea.   PAST MEDICAL HISTORY: Fairly significant with a known essential hypertension, back pain, restless leg syndrome and pulmonary  dysfunction.   PHYSICAL EXAMINATION:   VITAL SIGNS:  The patient's vital signs had been stable since admission.  HEAD AND NECK: Unremarkable. Well-healed thyroidectomy scar.  CHEST: Clear to auscultation.  CARDIAC: Regular rhythm without murmur or gallop.  ABDOMEN: Obese, soft, and nontender. No masses noted. No inguinal adenopathy.  EXTREMITIES: No lower extremity swelling. Normal pedal pulses.   LABORATORY STUDIES: On admission, showed a hemoglobin of 6.2 with a MCV of 58. RDW of 20.6. White blood cell count of 4800.  Normal liver function studies. Creatinine of 0.97 with an estimated GFR of 57.  Normal electrolytes.  The iron-binding capacity was normal at 404, unbound iron capacity 398, serum iron very low at 6 and iron saturation 1.5%.  Repeat CBC post transfusion showed a hemoglobin of 7.4.  White blood cell count differential is normal. Platelets were normal.   The patient's rectal exam was repeated and formed brown stool was noted that was heme-positive.   IMPRESSION: Slow gastrointestinal bleed, symptomatic with dehydration secondary to diarrhea.   Arrangements are in place for a repeat upper and lower endoscopy on 02/12/2015.       ____________________________ Earline MayotteJeffrey W. Byrnett, MD jwb:DT D: 02/10/2015 11:22:53 ET T: 02/10/2015 12:09:58 ET JOB#: [id number]  cc: Earline MayotteJeffrey W. Byrnett, MD, <Dictator> Winn JockJames C. Lovenia KimLittle, Jr., MD  JEFFREY Brion AlimentW BYRNETT MD ELECTRONICALLY SIGNED 02/10/2015 21:19

## 2015-03-12 NOTE — Consult Note (Addendum)
Amanda [name] NAME:  Amanda Amanda [name], Amanda Amanda [name] MR#:  [id number] DATE OF BIRTH:  August 17, 1941  DATE OF CONSULTATION:  02/10/2015  REFERRING PHYSICIAN:   CONSULTING PHYSICIAN:  Knute Neu. Lorre Nick, MD  HISTORY OF PRESENT ILLNESS:  Amanda Amanda [name] is 74 year old Amanda [name] who was admitted on April 1, with GI bleed and was given 2 units of blood, completing when I saw her. I saw and evaluated and spoke with other physicians on Amanda team on April 2, and then in fact did follow up on April 3. This narrative is delayed until Amanda current time and reflects Amanda evaluations performed on that April 2.   Amanda Amanda [name] as stated is 74 years old, and she was admitted with weakness, headache, and had reported that she has had up to 8 or 10 bowel movements a day which had been dark, black and watery along with abdominal cramping. She had similar cramping evaluated in Emergency Room on March 28, but was sent home. She has had weakness in retrospect coming on for months. She has admitted when Amanda history is clearly of GI bleed, and Amanda initial findings were of GI bleed with guaiac positive stool, and hemoglobin was 6.2. She was continued on her regular medications for chronic anxiety and depression and was continued on PPIs.   PAST HISTORY: Includes hypertension, diverticulosis; she had a colonoscopy in 2013, hiatal hernia, anxiety and depression; restless legs, and GERD.   PAST SURGERIES: Includes lumbar decompression, neck surgery, knee arthroscopy, partial hysterectomy, and thyroidectomy.   SOCIAL HISTORY: No alcohol, no tobacco.   FAMILY HISTORY: Brother has scleroderma. Also, there is a family history  brother had colon cancer, another brother had breast cancer; father had bladder cancer.   SYSTEM REVIEW: When I saw Amanda Amanda [name], she was already feeling better. Amanda  abdominal cramping was better. She had already received blood, her strength and attitude was better. She was still resting in Amanda bed. She was not complaining of chest pain,  retrosternal pain, palpitations or shortness of breath. No headache or dizziness which had been present before. No abdominal pain, cramping is better. No dysuria or hematuria. No back or bone pain. No rashes. No pruritus. No focal weakness. No numbness in any of Amanda extremities.   PHYSICAL EXAMINATION: GENERAL: She is alert and cooperative in no distress, mood and affect was unremarkable.  NEUROLOGIC: Grossly nonfocal, moving all extremities against gravity. I did not test her gait.  NECK: Was not tender, there was no adenopathy in Amanda neck, supraclavicular, submandibular, or axilla.  LUNGS: Clear, no wheezing or rales.  ABDOMEN: Benign, nontender, no palpable mass or organomegaly.  HEART: Regular. No gross focal weakness.   DIAGNOSTIC DATA: A CT scan was done on April 1, showed diffuse diverticular changes, absent uterus, a small hepatic cyst, mild scarring in Amanda lung bases with some stable nodularity unchanged from 2013. An admission creatinine was 0.97, calcium was   liver functions were normal, white count was 4.8, hemoglobin was 6.2, platelets were 318,000, MCV was 58; Amanda EKG was normal and Amanda stool was guaiac-positive.   IMPRESSION AND PLAN: Gastrointestinal bleeding resulting in blood loss anemia and iron deficiency anemia. Amanda Amanda [name] has already been given some blood. As hemoglobin is better, Amanda [name] is stable, and it looked like it would be appropriate to start her on oral iron. That should be held until luminal study, as this narrative was delayed, I can report that Amanda Amanda [name] indeed go on and have colonoscopy and endoscopy subsequent on April 4, with finding  of mild gastritis, also as reviewed in Amanda discharge summary.   RECOMMENDATION: To start oral iron right after Amanda luminal studies. Watch her closely to confirm that hemoglobin reconstitutes. There is no suspicion initially of other causes of anemia. This has to be confirmed eventually that hemoglobin reconstitutes on iron. There is  no automatic indication for parenteral iron, if indeed Amanda Amanda [name] can tolerate p.o.   SUMMARY AND PLAN: From Amanda hematology point of view, would want to repeat a CBC, would want to get serial hemoglobins and make sure that hemoglobin is reconstituting, as well as a hemoglobin comes normal, no other anemia work-up is needed. Initially recommended p.o. iron, IV iron [facility], if p.o. iron is not tolerated or if hemoglobin does not respond.   Another issue is family history and with a family history of malignancy including a brother with female breast cancer, if we confirm that history, we can go on and recommend genetic testing to this Amanda [name]. Would therefore want to follow her in Amanda cancer center at least once and she was to see surgery and medicine at discharge.    ____________________________ Knute Neuobert G. Lorre NickGittin, MD rgg:nt D: 03/10/2015 22:01:50 ET T: 03/11/2015 09:35:15 ET JOB#: [id number]  cc: Knute Neuobert G. Lorre NickGittin, MD, <Dictator> Marin RobertsOBERT G Dalani Mette MD ELECTRONICALLY SIGNED 03/27/2015 17:35

## 2015-03-20 ENCOUNTER — Telehealth: Payer: Self-pay | Admitting: *Deleted

## 2015-03-20 NOTE — Telephone Encounter (Signed)
Notified patient as instructed, patient pleased. Will see about getting appt with Dr Alva Garnetyner.

## 2015-03-20 NOTE — Telephone Encounter (Signed)
Please notify the patient no additional site of active bleeding was identified. One small cluster of veins in the colon with no bleeding was seen.  Will proceed with evaluation for hiatal hernia repair with Dr. Alva Garnetyner.

## 2015-03-20 NOTE — Telephone Encounter (Signed)
Patient is wanting to get the results from her capsule endoscopy

## 2015-03-29 ENCOUNTER — Ambulatory Visit: Payer: Self-pay | Admitting: Internal Medicine

## 2015-04-02 ENCOUNTER — Encounter: Payer: Self-pay | Admitting: General Surgery

## 2015-04-18 ENCOUNTER — Other Ambulatory Visit: Payer: Self-pay | Admitting: Family Medicine

## 2015-04-18 ENCOUNTER — Inpatient Hospital Stay: Payer: Medicare HMO | Attending: Internal Medicine | Admitting: Family Medicine

## 2015-04-18 ENCOUNTER — Ambulatory Visit: Payer: Medicare HMO

## 2015-04-18 ENCOUNTER — Encounter: Payer: Self-pay | Admitting: Family Medicine

## 2015-04-18 VITALS — BP 165/97 | HR 77 | Temp 97.4°F | Resp 16 | Ht 62.21 in | Wt 180.6 lb

## 2015-04-18 DIAGNOSIS — D500 Iron deficiency anemia secondary to blood loss (chronic): Secondary | ICD-10-CM | POA: Insufficient documentation

## 2015-04-18 DIAGNOSIS — Z87891 Personal history of nicotine dependence: Secondary | ICD-10-CM | POA: Diagnosis not present

## 2015-04-18 DIAGNOSIS — Z79899 Other long term (current) drug therapy: Secondary | ICD-10-CM | POA: Diagnosis not present

## 2015-04-18 DIAGNOSIS — R5383 Other fatigue: Secondary | ICD-10-CM | POA: Insufficient documentation

## 2015-04-18 DIAGNOSIS — I10 Essential (primary) hypertension: Secondary | ICD-10-CM | POA: Diagnosis not present

## 2015-04-18 DIAGNOSIS — F419 Anxiety disorder, unspecified: Secondary | ICD-10-CM | POA: Diagnosis not present

## 2015-04-18 DIAGNOSIS — D509 Iron deficiency anemia, unspecified: Secondary | ICD-10-CM

## 2015-04-18 DIAGNOSIS — R197 Diarrhea, unspecified: Secondary | ICD-10-CM

## 2015-04-18 DIAGNOSIS — K922 Gastrointestinal hemorrhage, unspecified: Secondary | ICD-10-CM | POA: Diagnosis not present

## 2015-04-18 LAB — COMPREHENSIVE METABOLIC PANEL
ALK PHOS: 82 U/L (ref 38–126)
ALT: 12 U/L — AB (ref 14–54)
ANION GAP: 10 (ref 5–15)
AST: 20 U/L (ref 15–41)
Albumin: 3.9 g/dL (ref 3.5–5.0)
BILIRUBIN TOTAL: 0.5 mg/dL (ref 0.3–1.2)
BUN: 19 mg/dL (ref 6–20)
CHLORIDE: 105 mmol/L (ref 101–111)
CO2: 25 mmol/L (ref 22–32)
CREATININE: 0.95 mg/dL (ref 0.44–1.00)
Calcium: 9.4 mg/dL (ref 8.9–10.3)
GFR calc Af Amer: >60 mL/min (ref >60)
GFR, EST NON AFRICAN AMERICAN: 57 mL/min — AB (ref >60)
Glucose, Bld: 89 mg/dL (ref 65–99)
POTASSIUM: 4.1 mmol/L (ref 3.5–5.1)
Sodium: 140 mmol/L (ref 135–145)
Total Protein: 7 g/dL (ref 6.5–8.1)

## 2015-04-18 LAB — CBC WITH DIFFERENTIAL/PLATELET
Basophils Absolute: 0 10*3/uL (ref 0–0.1)
Basophils Relative: 0 %
Eosinophils Absolute: 0.1 10*3/uL (ref 0–0.7)
Eosinophils Relative: 2 %
HEMATOCRIT: 39.3 % (ref 35.0–47.0)
Hemoglobin: 12.8 g/dL (ref 12.0–16.0)
LYMPHS ABS: 1.2 10*3/uL (ref 1.0–3.6)
Lymphocytes Relative: 33 %
MCH: 25.4 pg — ABNORMAL LOW (ref 26.0–34.0)
MCHC: 32.5 g/dL (ref 32.0–36.0)
MCV: 78.2 fL — ABNORMAL LOW (ref 80.0–100.0)
Monocytes Absolute: 0.2 10*3/uL (ref 0.2–0.9)
Monocytes Relative: 6 %
Neutro Abs: 2.2 10*3/uL (ref 1.4–6.5)
Neutrophils Relative %: 59 %
Platelets: 181 10*3/uL (ref 150–440)
RBC: 5.02 MIL/uL (ref 3.80–5.20)
RDW: 27.4 % — ABNORMAL HIGH (ref 11.5–14.5)
WBC: 3.7 10*3/uL (ref 3.6–11.0)

## 2015-04-18 LAB — FERRITIN: Ferritin: 14 ng/mL (ref 11–307)

## 2015-04-18 LAB — IRON AND TIBC
IRON: 32 ug/dL (ref 28–170)
Saturation Ratios: 9 % — ABNORMAL LOW (ref 10.4–31.8)
TIBC: 373 ug/dL (ref 250–450)
UIBC: 341 ug/dL

## 2015-04-18 NOTE — Progress Notes (Signed)
[facility]  Telephone:([phone]) [phone]  [phone]     Lemma Tetro DOB: May 11, 1941  MR#: [id number]  MWN#:[id number]  Patient Care Team: Ellyn Hack, MD as PCP - General (Family Medicine) Earline Mayotte, MD (General Surgery)  CHIEF COMPLAINT:  Chief Complaint  Patient presents with  . Follow-up    Here today for initial consult visit for IDA; reports diarrhea, black stools, fatigue and lethargy...    INTERVAL HISTORY:  74 year old female here for initial consult regarding possible iron deficiency anemia. She reports having increasing fatigue as well as weakness, diarrhea, and occasional black tarry stools. She was initially evaluated while in the hospital by Dr. Lorre Nick on 02/10/2015. Upon evaluation in April she had recently been admitted for a GI bleed and received 2 units of blood. At that time she had been having 8-10 bowel movements a day which had been dark black and watery, she has an extensive history of GI bleeding, is currently on daily PPIs.  REVIEW OF SYSTEMS:   Review of Systems  Constitutional: Positive for malaise/fatigue. Negative for fever, chills, weight loss and diaphoresis.  HENT: Negative for congestion, ear discharge, ear pain, hearing loss, nosebleeds, sore throat and tinnitus.   Eyes: Negative for blurred vision, double vision, photophobia, pain, discharge and redness.  Respiratory: Negative for cough, hemoptysis, sputum production, shortness of breath, wheezing and stridor.   Cardiovascular: Negative for chest pain, palpitations, orthopnea, claudication, leg swelling and PND.  Gastrointestinal: Positive for diarrhea. Negative for heartburn, nausea, vomiting, abdominal pain, constipation, blood in stool and melena.       Dark tarry stools  Genitourinary: Negative.   Musculoskeletal: Negative.   Skin: Negative.   Neurological: Positive for weakness. Negative for dizziness, tingling, focal weakness, seizures and headaches.    Endo/Heme/Allergies: Does not bruise/bleed easily.  Psychiatric/Behavioral: Negative for depression. The patient is not nervous/anxious and does not have insomnia.     As per HPI. Otherwise, a complete review of systems is negatve.  ONCOLOGY HISTORY:  No history exists.    PAST MEDICAL HISTORY: Past Medical History  Diagnosis Date  . Hypertension   . Anxiety     PAST SURGICAL HISTORY: Past Surgical History  Procedure Laterality Date  . Colonoscopy  04/28/2012  . Breast biopsy Left 01/17/2002  . Upper gi endoscopy  04/28/2012  . Colonoscopy  02-12-15    Dr Lemar Livings    FAMILY HISTORY History reviewed. No pertinent family history.  GYNECOLOGIC HISTORY:  No LMP recorded. Patient is postmenopausal.     ADVANCED DIRECTIVES:    HEALTH MAINTENANCE: History  Substance Use Topics  . Smoking status: Former Smoker -- 1.00 packs/day for 30 years    Types: Cigarettes  . Smokeless tobacco: Never Used  . Alcohol Use: No     Colonoscopy:  PAP:  Bone density:  Lipid panel:  Allergies  Allergen Reactions  . Codeine Nausea And Vomiting  . Penicillins Hives    Current Outpatient Prescriptions  Medication Sig Dispense Refill  . albuterol (PROVENTIL HFA;VENTOLIN HFA) 108 (90 BASE) MCG/ACT inhaler Inhale into the lungs every 6 (six) hours as needed for wheezing or shortness of breath.    . ALPRAZolam (XANAX) 1 MG tablet     . amLODipine (NORVASC) 5 MG tablet     . ascorbic acid (VITAMIN C) 1000 MG tablet Take 1,000 mg by mouth daily.    Marland Kitchen azithromycin (ZITHROMAX) 250 MG tablet Start with 12 tabs today then one tab a day til gone 6  each 0  . carvedilol (COREG) 6.25 MG tablet     . cholecalciferol (VITAMIN D) 1000 UNITS tablet Take 1,000 Units by mouth daily.    . cyclobenzaprine (FLEXERIL) 10 MG tablet Take 10 mg by mouth 3 (three) times daily as needed for muscle spasms.    . diazepam (VALIUM) 5 MG tablet Take 5 mg by mouth 3 (three) times daily.    Marland Kitchen. dicyclomine (BENTYL) 20 MG  tablet     . ferrous sulfate 325 (65 FE) MG tablet Take 325 mg by mouth daily with breakfast.    . Fluticasone-Salmeterol (ADVAIR) 250-50 MCG/DOSE AEPB Inhale 1 puff into the lungs 2 (two) times daily.    Marland Kitchen. HYDROcodone-acetaminophen (NORCO) 10-325 MG per tablet     . nabumetone (RELAFEN) 750 MG tablet     . omeprazole (PRILOSEC) 20 MG capsule     . ondansetron (ZOFRAN-ODT) 4 MG disintegrating tablet     . polyethylene glycol powder (GLYCOLAX/MIRALAX) powder 255 grams one bottle for colonoscopy prep 255 g 0  . rOPINIRole (REQUIP) 1 MG tablet     . traMADol (ULTRAM) 50 MG tablet Take 50 mg by mouth every 6 (six) hours as needed.     No current facility-administered medications for this visit.    OBJECTIVE: BP 165/97 mmHg  Pulse 77  Temp(Src) 97.4 F (36.3 C) (Tympanic)  Resp 16  Ht 5' 2.21" (1.58 m)  Wt 180 lb 8.9 oz (81.9 kg)  BMI 32.81 kg/m2  SpO2 96%   Body mass index is 32.81 kg/(m^2).    ECOG FS:1 - Symptomatic but completely ambulatory  General: Well-developed, well-nourished, no acute distress. Eyes: Pink conjunctiva, anicteric sclera. HEENT: Normocephalic, moist mucous membranes, clear oropharnyx. Lungs: Clear to auscultation bilaterally. Heart: Regular rate and rhythm. No rubs, murmurs, or gallops. Abdomen: Soft, nontender, nondistended. No organomegaly noted, normoactive bowel sounds. Musculoskeletal: No edema, cyanosis, or clubbing. Neuro: Alert, answering all questions appropriately. Cranial nerves grossly intact. Skin: No rashes or petechiae noted. Psych: Normal affect.    LAB RESULTS:     Component Value Date/Time   NA 139 02/13/2015 0516   K 3.4* 02/13/2015 0516   CL 104 02/13/2015 0516   CO2 28 02/13/2015 0516   GLUCOSE 90 02/13/2015 0516   BUN 15 02/13/2015 0516   CREATININE 1.12* 02/13/2015 0516   CALCIUM 8.6* 02/13/2015 0516   PROT 6.7 02/09/2015 1254   ALBUMIN 3.6 02/09/2015 1254   AST 16 02/09/2015 1254   ALT 12* 02/09/2015 1254   ALKPHOS 68  02/09/2015 1254   GFRNONAA 48* 02/13/2015 0516   GFRAA 56* 02/13/2015 0516    No results found for: SPEP, UPEP  Lab Results  Component Value Date   WBC 5.0 02/21/2015   NEUTROABS 2.8 02/21/2015   HGB 9.4* 02/21/2015   HCT 31.7* 02/21/2015   MCV 69* 02/21/2015   PLT 311 02/21/2015      Chemistry      Component Value Date/Time   NA 139 02/13/2015 0516   K 3.4* 02/13/2015 0516   CL 104 02/13/2015 0516   CO2 28 02/13/2015 0516   BUN 15 02/13/2015 0516   CREATININE 1.12* 02/13/2015 0516      Component Value Date/Time   CALCIUM 8.6* 02/13/2015 0516   ALKPHOS 68 02/09/2015 1254   AST 16 02/09/2015 1254   ALT 12* 02/09/2015 1254       No results found for: LABCA2  No components found for: [id number]  No results for input(s): INR in the  last 168 hours.  No results found for: COLORURINE, APPEARANCEUR, LABSPEC, PHURINE, GLUCOSEU, HGBUR, BILIRUBINUR, KETONESUR, PROTEINUR, UROBILINOGEN, NITRITE, LEUKOCYTESUR  STUDIES: No results found.  ASSESSMENT:  Iron deficiency anemia, secondary to chronic blood loss.   PLAN:    1. IDA, secondary to chronic blood loss. Patient's lab values have been assessed hemoglobin today is 12.8 hematocrit 39.3, iron 32 ferritin level 14. She is currently taking ferrous sulfate orally once per day. Advised patient to continue with current medication regimen, we will reevaluate her lab values in 4 weeks and determine continued follow-up at that time.   Patient expressed understanding and was in agreement with this plan. She also understands that She can call clinic at any time with any questions, concerns, or complaints.    Dr. Orlie Dakin was available for consultation and review of plan of care for this patient.   Loann Quill, NP   04/18/2015 10:42 AM

## 2015-04-23 ENCOUNTER — Emergency Department: Payer: Medicare HMO

## 2015-04-23 ENCOUNTER — Emergency Department
Admission: EM | Admit: 2015-04-23 | Discharge: 2015-04-23 | Disposition: A | Discharge status: Home / Self Care | Payer: Medicare HMO | Attending: Emergency Medicine | Admitting: Emergency Medicine

## 2015-04-23 ENCOUNTER — Encounter: Payer: Self-pay | Admitting: Emergency Medicine

## 2015-04-23 DIAGNOSIS — Z87891 Personal history of nicotine dependence: Secondary | ICD-10-CM | POA: Insufficient documentation

## 2015-04-23 DIAGNOSIS — Z7951 Long term (current) use of inhaled steroids: Secondary | ICD-10-CM | POA: Insufficient documentation

## 2015-04-23 DIAGNOSIS — M79672 Pain in left foot: Secondary | ICD-10-CM | POA: Diagnosis not present

## 2015-04-23 DIAGNOSIS — I10 Essential (primary) hypertension: Secondary | ICD-10-CM | POA: Diagnosis not present

## 2015-04-23 DIAGNOSIS — Z79899 Other long term (current) drug therapy: Secondary | ICD-10-CM | POA: Diagnosis not present

## 2015-04-23 DIAGNOSIS — Z880 Allergy status to penicillin: Secondary | ICD-10-CM | POA: Diagnosis not present

## 2015-04-23 HISTORY — DX: Diaphragmatic hernia without obstruction or gangrene: K44.9

## 2015-04-23 HISTORY — DX: Gastrointestinal hemorrhage, unspecified: K92.2

## 2015-04-23 MED ORDER — OXYCODONE HCL 5 MG PO TABS: 5.0000 mg | ORAL_TABLET | Freq: Three times a day (TID) | ORAL | Status: DC | PRN

## 2015-04-23 MED ORDER — OXYCODONE HCL 5 MG PO TABS
ORAL_TABLET | ORAL | Status: AC
  Administered 2015-04-23: 5 mg via ORAL
  Filled 2015-04-23: qty 1

## 2015-04-23 MED ORDER — OXYCODONE HCL 5 MG PO TABS
5.0000 mg | ORAL_TABLET | Freq: Once | ORAL | Status: AC
  Administered 2015-04-23: 5 mg via ORAL

## 2015-04-23 NOTE — ED Notes (Signed)
Denies new injury, states was walking out to porch this am and felt pain

## 2015-04-23 NOTE — ED Provider Notes (Signed)
[facility] Emergency Department Provider Note ____________________________________________  Time seen: Approximately 4:00 PM  I have reviewed the triage vital signs and the nursing notes.   HISTORY  Chief Complaint Foot Pain  HPI Amanda [name] is a 74 y.o. female who presents to the emergency department for acute on chronic left heel pain. She denies new injury. She states that this morning she was walking outside to sit in her swing as she usually does and had worsening of pain. She has only taken Tylenol for the pain today. She had been seeing Dr. Orland Jarred for this pain but was unable to complete his recommended treatment due to hospitalization for blood loss and esophageal "hernia". She states that she had been wearing a boot that Dr. Orland Jarred applied prior to hospitalization, but she has not had it on for a few weeks.   Past Medical History  Diagnosis Date  . Hypertension   . Anxiety   . Hiatal hernia   . GI bleed     Patient Active Problem List   Diagnosis Date Noted  . Absolute anemia 02/24/2015  . Acute bronchitis 02/24/2015  . Fracture of styloid process of radius 04/13/2014    Past Surgical History  Procedure Laterality Date  . Colonoscopy  04/28/2012  . Breast biopsy Left 01/17/2002  . Upper gi endoscopy  04/28/2012  . Colonoscopy  02-12-15    Dr Lemar Livings    Current Outpatient Rx  Name  Route  Sig  Dispense  Refill  . albuterol (PROVENTIL HFA;VENTOLIN HFA) 108 (90 BASE) MCG/ACT inhaler   Inhalation   Inhale into the lungs every 6 (six) hours as needed for wheezing or shortness of breath.         . ALPRAZolam (XANAX) 1 MG tablet               . amLODipine (NORVASC) 5 MG tablet               . ascorbic acid (VITAMIN C) 1000 MG tablet   Oral   Take 1,000 mg by mouth daily.         Marland Kitchen azithromycin (ZITHROMAX) 250 MG tablet      Start with 12 tabs today then one tab a day til gone   6 each   0   . carvedilol (COREG) 6.25 MG  tablet               . cholecalciferol (VITAMIN D) 1000 UNITS tablet   Oral   Take 1,000 Units by mouth daily.         . cyclobenzaprine (FLEXERIL) 10 MG tablet   Oral   Take 10 mg by mouth 3 (three) times daily as needed for muscle spasms.         . diazepam (VALIUM) 5 MG tablet   Oral   Take 5 mg by mouth 3 (three) times daily.         Marland Kitchen dicyclomine (BENTYL) 20 MG tablet               . ferrous sulfate 325 (65 FE) MG tablet   Oral   Take 325 mg by mouth daily with breakfast.         . Fluticasone-Salmeterol (ADVAIR) 250-50 MCG/DOSE AEPB   Inhalation   Inhale 1 puff into the lungs 2 (two) times daily.         Marland Kitchen HYDROcodone-acetaminophen (NORCO) 10-325 MG per tablet               .  nabumetone (RELAFEN) 750 MG tablet               . omeprazole (PRILOSEC) 20 MG capsule               . ondansetron (ZOFRAN-ODT) 4 MG disintegrating tablet               . polyethylene glycol powder (GLYCOLAX/MIRALAX) powder      255 grams one bottle for colonoscopy prep   255 g   0   . rOPINIRole (REQUIP) 1 MG tablet               . traMADol (ULTRAM) 50 MG tablet   Oral   Take 50 mg by mouth every 6 (six) hours as needed.           Allergies Codeine and Penicillins  No family history on file.  Social History History  Substance Use Topics  . Smoking status: Former Smoker -- 1.00 packs/day for 30 years    Types: Cigarettes  . Smokeless tobacco: Never Used  . Alcohol Use: No    Review of Systems Constitutional: Recent hospitalization for anemia Eyes: No visual changes. ENT: No sore throat. Cardiovascular: Denies chest pain or palpitations. Respiratory: Denies shortness of breath. Gastrointestinal: No abdominal pain.  Genitourinary: Negative for dysuria. Musculoskeletal: Pain in left heel Skin: Negative for rash. Neurological: Negative for headaches, focal weakness or numbness. 10-point ROS otherwise  negative.  ____________________________________________   PHYSICAL EXAM:  VITAL SIGNS: ED Triage Vitals  Enc Vitals Group     BP 04/23/15 1440 164/102 mmHg     Pulse Rate 04/23/15 1440 74     Resp --      Temp 04/23/15 1440 97.5 F (36.4 C)     Temp Source 04/23/15 1440 Oral     SpO2 04/23/15 1440 99 %     Weight 04/23/15 1440 177 lb (80.287 kg)     Height 04/23/15 1440  (1.575 m)     Head Cir --      Peak Flow --      Pain Score 04/23/15 1440 9     Pain Loc --      Pain Edu? --      Excl. in GC? --     Constitutional: Alert and oriented. Well appearing. Appears to be in pain. Eyes: Conjunctivae are normal. EOMI. Head: Atraumatic. Nose: No congestion/rhinnorhea. Neck: No stridor.  Respiratory: Normal respiratory effort.   Cardiovascular: 2+ pulses in DP bilateral Musculoskeletal: Tenderness to palpation over the posterior heel in the Achilles area. Full range of motion of the foot and ankle. Neurologic:  Normal speech and language. No gross focal neurologic deficits are appreciated. Speech is normal. No gait instability. Skin: Mild erythema in the area of pain without evidence of cellulitis. No edema.Marland Kitchen Psychiatric: Mood and affect are normal. Speech and behavior are normal.  ____________________________________________   LABS (all labs ordered are listed, but only abnormal results are displayed)  Labs Reviewed - No data to display ____________________________________________  RADIOLOGY  No acute bony abnormality. ____________________________________________   PROCEDURES  Procedure(s) performed: Cast shoe applied prior to discharge   ____________________________________________   INITIAL IMPRESSION / ASSESSMENT AND PLAN / ED COURSE  Pertinent labs & imaging results that were available during my care of the patient were reviewed by me and considered in my medical decision making (see chart for details).  Patient was advised to follow-up with Dr.  Orland Jarred. She was also advised to use the boot  that he had given her on a prior visit. She reports there has been some improvement of the pain after medication. She was advised to continue Tylenol but avoid all NSAIDs due to recent blood loss and anemia. She was advised to stay nonweightbearing until she can wear the boot. ____________________________________________   FINAL CLINICAL IMPRESSION(S) / ED DIAGNOSES  Final diagnoses:  None      Chinita Pester, FNP 04/23/15 1654  Maurilio Lovely, MD 04/23/15 2351

## 2015-04-23 NOTE — ED Notes (Signed)
Assessed per PA 

## 2015-04-25 ENCOUNTER — Ambulatory Visit: Payer: Medicare HMO

## 2015-05-15 ENCOUNTER — Other Ambulatory Visit: Payer: Self-pay | Admitting: *Deleted

## 2015-05-15 ENCOUNTER — Other Ambulatory Visit: Payer: Self-pay | Admitting: Bariatrics

## 2015-05-15 DIAGNOSIS — K449 Diaphragmatic hernia without obstruction or gangrene: Secondary | ICD-10-CM

## 2015-05-15 DIAGNOSIS — K219 Gastro-esophageal reflux disease without esophagitis: Secondary | ICD-10-CM

## 2015-05-15 DIAGNOSIS — D509 Iron deficiency anemia, unspecified: Secondary | ICD-10-CM

## 2015-05-16 ENCOUNTER — Inpatient Hospital Stay: Payer: Medicare HMO | Admitting: Family Medicine

## 2015-05-16 ENCOUNTER — Inpatient Hospital Stay: Payer: Medicare HMO | Attending: Family Medicine

## 2015-05-16 DIAGNOSIS — F419 Anxiety disorder, unspecified: Secondary | ICD-10-CM | POA: Insufficient documentation

## 2015-05-16 DIAGNOSIS — K449 Diaphragmatic hernia without obstruction or gangrene: Secondary | ICD-10-CM | POA: Insufficient documentation

## 2015-05-16 DIAGNOSIS — I10 Essential (primary) hypertension: Secondary | ICD-10-CM | POA: Insufficient documentation

## 2015-05-16 DIAGNOSIS — R531 Weakness: Secondary | ICD-10-CM | POA: Insufficient documentation

## 2015-05-16 DIAGNOSIS — Z79899 Other long term (current) drug therapy: Secondary | ICD-10-CM | POA: Insufficient documentation

## 2015-05-16 DIAGNOSIS — R5383 Other fatigue: Secondary | ICD-10-CM | POA: Insufficient documentation

## 2015-05-16 DIAGNOSIS — D500 Iron deficiency anemia secondary to blood loss (chronic): Secondary | ICD-10-CM | POA: Insufficient documentation

## 2015-05-16 DIAGNOSIS — Z87891 Personal history of nicotine dependence: Secondary | ICD-10-CM | POA: Insufficient documentation

## 2015-05-16 DIAGNOSIS — R197 Diarrhea, unspecified: Secondary | ICD-10-CM | POA: Insufficient documentation

## 2015-05-22 ENCOUNTER — Ambulatory Visit: Admission: RE | Admit: 2015-05-22 | Payer: Medicare HMO | Source: Ambulatory Visit

## 2015-05-22 ENCOUNTER — Inpatient Hospital Stay: Admission: RE | Admit: 2015-05-22 | Payer: Medicare HMO | Source: Ambulatory Visit

## 2015-05-25 DIAGNOSIS — K449 Diaphragmatic hernia without obstruction or gangrene: Secondary | ICD-10-CM | POA: Insufficient documentation

## 2015-05-31 ENCOUNTER — Ambulatory Visit: Payer: Medicare HMO | Admitting: Family Medicine

## 2015-05-31 ENCOUNTER — Other Ambulatory Visit: Payer: Medicare HMO

## 2015-06-01 ENCOUNTER — Inpatient Hospital Stay: Payer: Medicare HMO

## 2015-06-01 ENCOUNTER — Inpatient Hospital Stay (HOSPITAL_BASED_OUTPATIENT_CLINIC_OR_DEPARTMENT_OTHER): Payer: Medicare HMO | Admitting: Family Medicine

## 2015-06-01 VITALS — BP 144/86 | HR 76 | Temp 97.6°F | Resp 16 | Wt 177.2 lb

## 2015-06-01 DIAGNOSIS — R197 Diarrhea, unspecified: Secondary | ICD-10-CM | POA: Diagnosis not present

## 2015-06-01 DIAGNOSIS — R5383 Other fatigue: Secondary | ICD-10-CM | POA: Diagnosis not present

## 2015-06-01 DIAGNOSIS — K449 Diaphragmatic hernia without obstruction or gangrene: Secondary | ICD-10-CM

## 2015-06-01 DIAGNOSIS — R531 Weakness: Secondary | ICD-10-CM | POA: Diagnosis not present

## 2015-06-01 DIAGNOSIS — I10 Essential (primary) hypertension: Secondary | ICD-10-CM

## 2015-06-01 DIAGNOSIS — F419 Anxiety disorder, unspecified: Secondary | ICD-10-CM

## 2015-06-01 DIAGNOSIS — D509 Iron deficiency anemia, unspecified: Secondary | ICD-10-CM

## 2015-06-01 DIAGNOSIS — D500 Iron deficiency anemia secondary to blood loss (chronic): Secondary | ICD-10-CM

## 2015-06-01 DIAGNOSIS — Z87891 Personal history of nicotine dependence: Secondary | ICD-10-CM | POA: Diagnosis not present

## 2015-06-01 DIAGNOSIS — Z79899 Other long term (current) drug therapy: Secondary | ICD-10-CM

## 2015-06-01 LAB — CBC WITH DIFFERENTIAL/PLATELET
Basophils Absolute: 0.1 10*3/uL (ref 0–0.1)
Basophils Relative: 1 %
EOS PCT: 1 %
Eosinophils Absolute: 0.1 10*3/uL (ref 0–0.7)
HCT: 41.6 % (ref 35.0–47.0)
HEMOGLOBIN: 13.6 g/dL (ref 12.0–16.0)
Lymphocytes Relative: 19 %
Lymphs Abs: 1.1 10*3/uL (ref 1.0–3.6)
MCH: 27.5 pg (ref 26.0–34.0)
MCHC: 32.6 g/dL (ref 32.0–36.0)
MCV: 84.4 fL (ref 80.0–100.0)
MONO ABS: 0.2 10*3/uL (ref 0.2–0.9)
MONOS PCT: 3 %
NEUTROS PCT: 76 %
Neutro Abs: 4.2 10*3/uL (ref 1.4–6.5)
PLATELETS: 195 10*3/uL (ref 150–440)
RBC: 4.93 MIL/uL (ref 3.80–5.20)
RDW: 13.8 % (ref 11.5–14.5)
WBC: 5.6 10*3/uL (ref 3.6–11.0)

## 2015-06-01 LAB — FERRITIN: Ferritin: 20 ng/mL (ref 11–307)

## 2015-06-01 NOTE — Progress Notes (Signed)
[facility]  [phone]  [phone]     Brynna Dobos DOB: 1941-08-20  MR#: [id number]  AYT#:[id number]  Patient Care Team: Ellyn Hack, MD as PCP - General (Family Medicine) Earline Mayotte, MD (General Surgery)  CHIEF COMPLAINT:  Chief Complaint  Patient presents with  . Follow-up    States that she was told she has a hiatal hernia which is bleeding into her stomach...   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  INTERVAL HISTORY: 74 year old female here for further evaluation and treatment consideration regarding iron deficiency anemia. She reports having increasing fatigue as well as weakness, diarrhea, and occasional black tarry stools. She was initially evaluated while in the hospital by Dr. Lorre Nick on 02/10/2015. Upon evaluation in April she had recently been admitted for a GI bleed and received 2 units of blood. At that time she had been having 8-10 bowel movements a day which had been dark black and watery, she has an extensive history of GI bleeding, is currently on daily PPIs. Today she denies any black tarry stools, bright red rectal bleeding. She overall feels very well other than some slight weakness. Patient has been evaluated by Dr. Lemar Livings for hiatal hernia repair. Per patient, Dr. Alva Garnet is supposed to be performing this procedure in [place]. She has an upper GI evaluation pending with imaging.  REVIEW OF SYSTEMS:   Review of Systems  Constitutional: Negative for fever, chills, weight loss, malaise/fatigue and diaphoresis.  HENT: Negative for congestion, ear discharge, ear pain, hearing loss, nosebleeds, sore throat and tinnitus.   Eyes: Negative for blurred vision, double vision, photophobia, pain, discharge and redness.  Respiratory: Negative for cough, hemoptysis, sputum production, shortness of breath, wheezing and stridor.   Cardiovascular: Negative for chest pain, palpitations, orthopnea,  claudication, leg swelling and PND.  Gastrointestinal: Negative for heartburn, nausea, vomiting, abdominal pain, diarrhea, constipation, blood in stool and melena.  Genitourinary: Negative.   Musculoskeletal: Negative.   Skin: Negative.   Neurological: Positive for weakness. Negative for dizziness, tingling, focal weakness, seizures and headaches.  Endo/Heme/Allergies: Does not bruise/bleed easily.  Psychiatric/Behavioral: Negative for depression. The patient is not nervous/anxious and does not have insomnia.     As per HPI. Otherwise, a complete review of systems is negatve.  ONCOLOGY HISTORY:  No history exists.    PAST MEDICAL HISTORY: Past Medical History  Diagnosis Date  . Hypertension   . Anxiety   . Hiatal hernia   . GI bleed     PAST SURGICAL HISTORY: Past Surgical History  Procedure Laterality Date  . Colonoscopy  04/28/2012  . Breast biopsy Left 01/17/2002  . Upper gi endoscopy  04/28/2012  . Colonoscopy  02-12-15    Dr Lemar Livings    FAMILY HISTORY No family history on file.  GYNECOLOGIC HISTORY:  No LMP recorded. Patient is postmenopausal.     ADVANCED DIRECTIVES:    HEALTH MAINTENANCE: History  Substance Use Topics  . Smoking status: Former Smoker -- 1.00 packs/day for 30 years    Types: Cigarettes  . Smokeless tobacco: Never Used  . Alcohol Use: No     Colonoscopy:  PAP:  Bone density:  Lipid panel:  Allergies  Allergen Reactions  . Codeine Nausea And Vomiting  . Penicillins Hives    Current Outpatient Prescriptions  Medication Sig Dispense Refill  . albuterol (PROVENTIL HFA;VENTOLIN HFA) 108 (90 BASE) MCG/ACT inhaler Inhale into the lungs every 6 (six) hours as needed for wheezing  or shortness of breath.    . ALPRAZolam (XANAX) 1 MG tablet     . amLODipine (NORVASC) 5 MG tablet     . ascorbic acid (VITAMIN C) 1000 MG tablet Take 1,000 mg by mouth daily.    . carvedilol (COREG) 6.25 MG tablet     . cholecalciferol (VITAMIN D) 1000 UNITS  tablet Take 1,000 Units by mouth daily.    . cyclobenzaprine (FLEXERIL) 10 MG tablet Take 10 mg by mouth 3 (three) times daily as needed for muscle spasms.    . diazepam (VALIUM) 5 MG tablet Take 5 mg by mouth 3 (three) times daily.    Marland Kitchen dicyclomine (BENTYL) 20 MG tablet     . ferrous sulfate 325 (65 FE) MG tablet Take 325 mg by mouth daily with breakfast.    . Fluticasone-Salmeterol (ADVAIR) 250-50 MCG/DOSE AEPB Inhale 1 puff into the lungs 2 (two) times daily.    Marland Kitchen HYDROcodone-acetaminophen (NORCO) 10-325 MG per tablet     . nabumetone (RELAFEN) 750 MG tablet     . omeprazole (PRILOSEC) 20 MG capsule     . ondansetron (ZOFRAN-ODT) 4 MG disintegrating tablet     . polyethylene glycol powder (GLYCOLAX/MIRALAX) powder 255 grams one bottle for colonoscopy prep 255 g 0  . rOPINIRole (REQUIP) 1 MG tablet     . traMADol (ULTRAM) 50 MG tablet Take 50 mg by mouth every 6 (six) hours as needed.    Marland Kitchen azithromycin (ZITHROMAX) 250 MG tablet Start with 12 tabs today then one tab a day til gone (Patient not taking: Reported on 06/01/2015) 6 each 0  . methylPREDNISolone (MEDROL DOSEPAK) 4 MG TBPK tablet     . oxyCODONE (OXY IR/ROXICODONE) 5 MG immediate release tablet Take 1 tablet (5 mg total) by mouth 3 (three) times daily as needed for severe pain. (Patient not taking: Reported on 06/01/2015) 9 tablet 0   No current facility-administered medications for this visit.    OBJECTIVE: BP 144/86 mmHg  Pulse 76  Temp(Src) 97.6 F (36.4 C) (Oral)  Resp 16  Wt 177 lb 4 oz (80.4 kg)  SpO2 97%   Body mass index is 32.41 kg/(m^2).    ECOG FS:0 - Asymptomatic  General: Well-developed, well-nourished, no acute distress. Eyes: Pink conjunctiva, anicteric sclera. HEENT: Normocephalic, moist mucous membranes, clear oropharnyx. Lungs: Clear to auscultation bilaterally. Heart: Regular rate and rhythm. No rubs, murmurs, or gallops. Abdomen: Soft, nontender, nondistended. No organomegaly noted, normoactive bowel  sounds. Musculoskeletal: No edema, cyanosis, or clubbing. Neuro: Alert, answering all questions appropriately. Cranial nerves grossly intact. Skin: No rashes or petechiae noted. Psych: Normal affect.    LAB RESULTS:  Appointment on 06/01/2015  Component Date Value Ref Range Status  . WBC 06/01/2015 5.6  3.6 - 11.0 K/uL Final  . RBC 06/01/2015 4.93  3.80 - 5.20 MIL/uL Final  . Hemoglobin 06/01/2015 13.6  12.0 - 16.0 g/dL Final  . HCT 16/08/9603 41.6  35.0 - 47.0 % Final  . MCV 06/01/2015 84.4  80.0 - 100.0 fL Final  . MCH 06/01/2015 27.5  26.0 - 34.0 pg Final  . MCHC 06/01/2015 32.6  32.0 - 36.0 g/dL Final  . RDW 54/07/8118 13.8  11.5 - 14.5 % Final  . Platelets 06/01/2015 195  150 - 440 K/uL Final  . Neutrophils Relative % 06/01/2015 76   Final  . Neutro Abs 06/01/2015 4.2  1.4 - 6.5 K/uL Final  . Lymphocytes Relative 06/01/2015 19   Final  . Lymphs Abs 06/01/2015  1.1  1.0 - 3.6 K/uL Final  . Monocytes Relative 06/01/2015 3   Final  . Monocytes Absolute 06/01/2015 0.2  0.2 - 0.9 K/uL Final  . Eosinophils Relative 06/01/2015 1   Final  . Eosinophils Absolute 06/01/2015 0.1  0 - 0.7 K/uL Final  . Basophils Relative 06/01/2015 1   Final  . Basophils Absolute 06/01/2015 0.1  0 - 0.1 K/uL Final    STUDIES: No results found.  ASSESSMENT:  Iron deficiency anemia, secondary to chronic blood loss  PLAN:   1. IDA, secondary to chronic blood loss. Patient reports feeling fairly well overall. She continues to take oral iron once per day. Hemoglobin today 13.9, ferritin level 20. We will schedule patient for 2 transfusions of Feraheme in office. In reference to hiatal hernia repair, patient should be able to tolerate surgery very well. I have contacted Dr. Allayne Butcher office to have imaging rescheduled for patient as she missed the appointment on 05/22/2015. Spoke with Crystal at Dr. Allayne Butcher office and she is going to call patient.  Patient expressed understanding and was in agreement with  this plan. She also understands that She can call clinic at any time with any questions, concerns, or complaints.   Dr. Doylene Canning was available for consultation and review of plan of care for this patient.   Loann Quill, NP   06/01/2015 12:07 PM

## 2015-06-04 ENCOUNTER — Other Ambulatory Visit: Payer: Self-pay | Admitting: Family Medicine

## 2015-06-07 ENCOUNTER — Ambulatory Visit
Admission: RE | Admit: 2015-06-07 | Discharge: 2015-06-07 | Disposition: A | Discharge status: Home / Self Care | Payer: Medicare HMO | Source: Ambulatory Visit | Attending: Bariatrics | Admitting: Bariatrics

## 2015-06-07 DIAGNOSIS — K219 Gastro-esophageal reflux disease without esophagitis: Secondary | ICD-10-CM

## 2015-06-07 DIAGNOSIS — K449 Diaphragmatic hernia without obstruction or gangrene: Secondary | ICD-10-CM | POA: Insufficient documentation

## 2015-06-08 ENCOUNTER — Inpatient Hospital Stay: Payer: Medicare HMO

## 2015-06-08 VITALS — BP 108/70 | HR 62 | Temp 96.3°F | Resp 20

## 2015-06-08 DIAGNOSIS — Z01818 Encounter for other preprocedural examination: Secondary | ICD-10-CM | POA: Insufficient documentation

## 2015-06-08 DIAGNOSIS — D500 Iron deficiency anemia secondary to blood loss (chronic): Secondary | ICD-10-CM | POA: Diagnosis not present

## 2015-06-08 MED ORDER — SODIUM CHLORIDE 0.9 % IV SOLN
510.0000 mg | Freq: Once | INTRAVENOUS | Status: AC
  Administered 2015-06-08: 510 mg via INTRAVENOUS
  Filled 2015-06-08: qty 17

## 2015-06-08 MED ORDER — SODIUM CHLORIDE 0.9 % IV SOLN
Freq: Once | INTRAVENOUS | Status: AC
  Administered 2015-06-08: 15:00:00 via INTRAVENOUS
  Filled 2015-06-08: qty 1000

## 2015-06-13 ENCOUNTER — Ambulatory Visit
Admission: RE | Admit: 2015-06-13 | Discharge: 2015-06-13 | Disposition: A | Discharge status: Home / Self Care | Payer: Medicare HMO | Source: Ambulatory Visit | Attending: Gastroenterology | Admitting: Gastroenterology

## 2015-06-13 ENCOUNTER — Encounter
Admission: RE | Disposition: A | Discharge status: Home / Self Care | Payer: Self-pay | Source: Ambulatory Visit | Attending: Gastroenterology

## 2015-06-13 DIAGNOSIS — R1310 Dysphagia, unspecified: Secondary | ICD-10-CM | POA: Insufficient documentation

## 2015-06-13 DIAGNOSIS — K449 Diaphragmatic hernia without obstruction or gangrene: Secondary | ICD-10-CM | POA: Insufficient documentation

## 2015-06-13 DIAGNOSIS — Z79899 Other long term (current) drug therapy: Secondary | ICD-10-CM | POA: Insufficient documentation

## 2015-06-13 DIAGNOSIS — R079 Chest pain, unspecified: Secondary | ICD-10-CM | POA: Insufficient documentation

## 2015-06-13 HISTORY — PX: ESOPHAGEAL MANOMETRY: SHX5429

## 2015-06-13 SURGERY — MANOMETRY, ESOPHAGUS

## 2015-06-13 MED ORDER — LIDOCAINE HCL 2 % EX GEL
1.0000 "application " | Freq: Once | CUTANEOUS | Status: AC
  Administered 2015-06-13: 3

## 2015-06-13 MED ORDER — BUTAMBEN-TETRACAINE-BENZOCAINE 2-2-14 % EX AERO
1.0000 | INHALATION_SPRAY | Freq: Once | CUTANEOUS | Status: AC
  Administered 2015-06-13: 1 via TOPICAL

## 2015-06-13 MED ORDER — LIDOCAINE HCL 2 % EX GEL
CUTANEOUS | Status: AC
  Administered 2015-06-13: 3
  Filled 2015-06-13: qty 5

## 2015-06-13 SURGICAL SUPPLY — 2 items
FACESHIELD LNG OPTICON STERILE (SAFETY) IMPLANT
GLOVE BIO SURGEON STRL SZ8 (GLOVE) ×6 IMPLANT

## 2015-06-14 ENCOUNTER — Encounter: Payer: Self-pay | Admitting: Gastroenterology

## 2015-06-15 ENCOUNTER — Inpatient Hospital Stay: Payer: Medicare HMO | Attending: Family Medicine

## 2015-06-15 VITALS — BP 114/72 | HR 77 | Temp 96.1°F | Resp 18

## 2015-06-15 DIAGNOSIS — K449 Diaphragmatic hernia without obstruction or gangrene: Secondary | ICD-10-CM | POA: Diagnosis not present

## 2015-06-15 DIAGNOSIS — D500 Iron deficiency anemia secondary to blood loss (chronic): Secondary | ICD-10-CM

## 2015-06-15 MED ORDER — SODIUM CHLORIDE 0.9 % IV SOLN
510.0000 mg | Freq: Once | INTRAVENOUS | Status: AC
  Administered 2015-06-15: 510 mg via INTRAVENOUS
  Filled 2015-06-15 (×2): qty 17

## 2015-06-15 MED ORDER — SODIUM CHLORIDE 0.9 % IV SOLN
Freq: Once | INTRAVENOUS | Status: AC
  Administered 2015-06-15: 14:00:00 via INTRAVENOUS
  Filled 2015-06-15: qty 1000

## 2015-06-22 ENCOUNTER — Ambulatory Visit: Payer: Medicare HMO

## 2015-07-04 DIAGNOSIS — I10 Essential (primary) hypertension: Secondary | ICD-10-CM | POA: Insufficient documentation

## 2015-07-04 DIAGNOSIS — R0600 Dyspnea, unspecified: Secondary | ICD-10-CM | POA: Insufficient documentation

## 2015-07-26 HISTORY — PX: PARAESOPHAGEAL HERNIA REPAIR: SHX2161

## 2015-08-11 HISTORY — PX: ESOPHAGUS SURGERY: SHX626

## 2015-08-24 ENCOUNTER — Ambulatory Visit: Payer: Medicare HMO

## 2015-08-24 ENCOUNTER — Other Ambulatory Visit: Payer: Medicare HMO

## 2015-08-30 ENCOUNTER — Other Ambulatory Visit: Payer: Self-pay | Admitting: *Deleted

## 2015-08-30 DIAGNOSIS — D509 Iron deficiency anemia, unspecified: Secondary | ICD-10-CM

## 2015-08-31 ENCOUNTER — Inpatient Hospital Stay: Payer: Medicare HMO | Admitting: Internal Medicine

## 2015-08-31 ENCOUNTER — Encounter: Payer: Self-pay | Admitting: *Deleted

## 2015-08-31 ENCOUNTER — Inpatient Hospital Stay: Payer: Medicare HMO

## 2015-09-13 ENCOUNTER — Encounter: Payer: Self-pay | Admitting: *Deleted

## 2015-09-13 ENCOUNTER — Inpatient Hospital Stay: Payer: Medicare HMO | Admitting: Internal Medicine

## 2015-09-13 ENCOUNTER — Inpatient Hospital Stay: Payer: Medicare HMO

## 2015-09-13 NOTE — Progress Notes (Signed)
patient no showed today with Dr. Donneta RombergBrahmanday. This is the 3rd time-patient did not keep apt with MD for anemia f/u.  Letter sent patient letter in mail. At this time, our office will not r/s this appointment.

## 2015-09-14 NOTE — Progress Notes (Signed)
Certified mail--tracking #  [id number]

## 2015-10-01 ENCOUNTER — Inpatient Hospital Stay: Payer: Medicare HMO | Attending: Internal Medicine

## 2015-10-01 ENCOUNTER — Inpatient Hospital Stay: Payer: Medicare HMO | Admitting: Internal Medicine

## 2015-12-26 ENCOUNTER — Other Ambulatory Visit: Payer: Self-pay | Admitting: Bariatrics

## 2015-12-26 DIAGNOSIS — K219 Gastro-esophageal reflux disease without esophagitis: Secondary | ICD-10-CM

## 2015-12-31 ENCOUNTER — Ambulatory Visit
Admission: RE | Admit: 2015-12-31 | Discharge: 2015-12-31 | Disposition: A | Discharge status: Home / Self Care | Payer: Medicare Other | Source: Ambulatory Visit | Attending: Bariatrics | Admitting: Bariatrics

## 2015-12-31 DIAGNOSIS — K219 Gastro-esophageal reflux disease without esophagitis: Secondary | ICD-10-CM | POA: Diagnosis present

## 2015-12-31 DIAGNOSIS — K228 Other specified diseases of esophagus: Secondary | ICD-10-CM | POA: Insufficient documentation

## 2016-01-01 ENCOUNTER — Encounter: Payer: Self-pay | Admitting: *Deleted

## 2016-01-08 ENCOUNTER — Ambulatory Visit (INDEPENDENT_AMBULATORY_CARE_PROVIDER_SITE_OTHER): Payer: Medicare Other | Admitting: General Surgery

## 2016-01-08 ENCOUNTER — Encounter: Payer: Self-pay | Admitting: General Surgery

## 2016-01-08 VITALS — BP 132/68 | HR 82 | Resp 16 | Ht 63.0 in | Wt 182.0 lb

## 2016-01-08 DIAGNOSIS — R197 Diarrhea, unspecified: Secondary | ICD-10-CM

## 2016-01-08 NOTE — Progress Notes (Signed)
Patient ID: Amanda [name], female   DOB: [birth year], 75 y.o.   MRN: [id number]  Chief Complaint  Patient presents with  . Diarrhea    HPI Amanda [name] is a 75 y.o. female.  Here today for evaluation of diarrhea. She states it started about 2 months ago after she started back on regular foods after her surgery by Dr Alva Garnet. She states it occurs after she eats varying from a small amount to a lot of volume and it does not matter what she eats.  She describes the stool as a "sticky paste." She does have cramps in the lower abdomen with every BM.  She does take the lomotil when the diarrhea is worse. She does admit to stool leakage on most days. No bleeding noted and guaiac check was negative with Dr. Clarene Duke. She states her labs were normal as well.  Denies vomiting. She does have belching. She has been using probiotics for a week with no change in bowel function. Swallow study was 12-31-15. Her last iron supplement was 2 months ago. She states her weight is stable. No medication changes over the past 2 months. She is on Norco for her upper back pain.    HPI  Past Medical History  Diagnosis Date  . Hypertension   . Anxiety   . Hiatal hernia   . GI bleed   . IDA (iron deficiency anemia)     Past Surgical History  Procedure Laterality Date  . Colonoscopy  04/28/2012  . Breast biopsy Left 01/17/2002  . Upper gi endoscopy  04/28/2012  . Colonoscopy  02-12-15    Dr Lemar Livings  . Esophageal manometry N/A 06/13/2015    Procedure: ESOPHAGEAL MANOMETRY (EM);  Surgeon: Elnita Maxwell, MD;  Location: [facility];  Service: Endoscopy;  Laterality: N/A;  . Esophagus surgery  Oct 2016    Dr Alva Garnet in [place]  . 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  Social History Social History  Substance Use Topics  . Smoking status: Former Smoker -- 1.00 packs/day for 30 years    Types: Cigarettes  . Smokeless tobacco: Never Used  . Alcohol Use: No     Allergies  Allergen Reactions  . Codeine Nausea And Vomiting  . Penicillins Hives    Current Outpatient Prescriptions  Medication Sig Dispense Refill  . albuterol (PROVENTIL HFA;VENTOLIN HFA) 108 (90 BASE) MCG/ACT inhaler Inhale into the lungs every 6 (six) hours as needed for wheezing or shortness of breath.    . ALPRAZolam (XANAX) 1 MG tablet Take 0.5 mg by mouth 3 (three) times daily.     Marland Kitchen amLODipine (NORVASC) 5 MG tablet     . ascorbic acid (VITAMIN C) 1000 MG tablet Take 1,000 mg by mouth daily.    . carvedilol (COREG) 6.25 MG tablet     . cholecalciferol (VITAMIN D) 1000 UNITS tablet Take 1,000 Units by mouth daily.    . cyclobenzaprine (FLEXERIL) 10 MG tablet Take 10 mg by mouth 3 (three) times daily as needed for muscle spasms.    . diazepam (VALIUM) 5 MG tablet Take 5 mg by mouth 3 (three) times daily.    Marland Kitchen dicyclomine (BENTYL) 20 MG tablet     . diphenoxylate-atropine (LOMOTIL) 2.5-0.025 MG tablet Take 2 tablets by mouth 4 (four) times daily as needed for diarrhea or loose stools.    . ferrous sulfate 325 (65 FE) MG tablet Take 325 mg by mouth daily with  breakfast.    . Fluticasone-Salmeterol (ADVAIR) 250-50 MCG/DOSE AEPB Inhale 1 puff into the lungs 2 (two) times daily.    Marland Kitchen HYDROcodone-acetaminophen (NORCO) 10-325 MG per tablet 1 tablet every 4 (four) hours as needed.     . Lactobacillus (DIGESTIVE HEALTH PROBIOTIC PO) Take by mouth.    . methylPREDNISolone (MEDROL DOSEPAK) 4 MG TBPK tablet     . nabumetone (RELAFEN) 750 MG tablet     . omeprazole (PRILOSEC) 20 MG capsule      No current facility-administered medications for this visit.    Review of Systems Review of Systems  Constitutional: Negative.   Respiratory: Negative.   Cardiovascular: Negative.   Gastrointestinal: Positive for abdominal pain and diarrhea. Negative for vomiting.    Blood pressure 132/68, pulse 82, resp. rate 16, height  (1.6 m), weight 182 lb (82.555 kg).  Physical Exam Physical  Exam  Constitutional: She is oriented to person, place, and time. She appears well-developed and well-nourished.  HENT:  Mouth/Throat: Oropharynx is clear and moist.  Eyes: Conjunctivae are normal. No scleral icterus.  Neck: Neck supple.  Cardiovascular: Normal rate, regular rhythm and normal heart sounds.   Pulmonary/Chest: Effort normal and breath sounds normal.  Abdominal: Soft. Bowel sounds are normal. There is no tenderness.  Lymphadenopathy:    She has no cervical adenopathy.  Neurological: She is alert and oriented to person, place, and time.  Skin: Skin is warm and dry.  Psychiatric: Her behavior is normal.    Data Reviewed 12/31/2015. Barium swallow: IMPRESSION: 1. Small amount of laryngeal penetration of the barium reaching the vocal cords and not eliciting a cough reflex. The patient reports having occasional episodes of "food going down the wrong way". 2. Moderate changes of presbyesophagus. 3. Prominent cricopharyngeus muscle impression consistent with the patient's history of chronic reflux. No reflux was observed today. 4. The large hiatal hernia previously demonstrated is no longer evident.   Assessment    Change in stool habits, atypical abdominal pain, difficult to correlate with anatomic process.  Benign colonoscopy just under 2 years ago.  Heme-negative stools by patient report.    Plan    The patient has been asked to make use of a fiber supplement daily. We'll attempt to obtain her recent laboratory studies. The patient will complete a set of stool Hemoccult cards.  She is scheduled to follow-up with Dr. Alva Garnet in the next week or 2 in regards to her hiatal hernia repair.  This time I see no indication for repeat colonoscopy.     Obtain recent labs. Complete take home stool cards. Recommend fiber supplement of choice on a daily basis, Amanda [name].   PCP:  Aida Puffer Ref Benancio Deeds PA (Dr.Michael Tyner-Bariatric)  This information  has been scribed by Dorathy Daft RNBC.   Earline Mayotte 01/09/2016, 8:10 PM   )

## 2016-01-08 NOTE — Patient Instructions (Addendum)
The patient is aware to call back for any questions or concerns. Recommend fiber supplement of choice on a daily basis, Meta Wafer Bring stool cards back after completed.

## 2016-01-09 DIAGNOSIS — R197 Diarrhea, unspecified: Secondary | ICD-10-CM | POA: Insufficient documentation

## 2016-01-16 ENCOUNTER — Ambulatory Visit (INDEPENDENT_AMBULATORY_CARE_PROVIDER_SITE_OTHER): Payer: Medicare Other | Admitting: *Deleted

## 2016-01-16 DIAGNOSIS — D649 Anemia, unspecified: Secondary | ICD-10-CM

## 2016-01-16 LAB — POC HEMOCCULT BLD/STL (HOME/3-CARD/SCREEN)
Card #2 Fecal Occult Blod, POC: NEGATIVE
Card #3 Fecal Occult Blood, POC: NEGATIVE
Fecal Occult Blood, POC: NEGATIVE

## 2016-01-16 NOTE — Progress Notes (Signed)
Stool cards all 3 are negative.

## 2016-01-16 NOTE — Patient Instructions (Signed)
The patient is aware to call back for any questions or concerns.  

## 2016-01-28 ENCOUNTER — Encounter: Payer: Self-pay | Admitting: General Surgery

## 2016-01-28 NOTE — Progress Notes (Signed)
CBC completed by her primary care physician dated 12/06/2015 was reviewed. Hemoglobin 13.5, MCV 87, iron low end of normal at 49 (45-160) TIBC low end of normal 258 (250-450, percent saturation 19 (11-50. [facility] metabolic panel was unremarkable. Albumin 4.1, creatinine 0.9 normal lipase, T4 and hemoglobin A1c.  Stool cards submitted by the patient were all negative for occult bloods (3).  Anticipate resolution of her GI distress with the institution of fiber supplements.

## 2016-02-12 ENCOUNTER — Telehealth: Payer: Self-pay | Admitting: *Deleted

## 2016-02-12 NOTE — Telephone Encounter (Signed)
She states she is much better, only a few days that was "bad". This past weekend she had a little diarrhea. She is using Culturelle one a day and Creon TID. The patient is aware to call back for any questions or concerns.

## 2016-02-12 NOTE — Telephone Encounter (Signed)
-----   Message from Earline MayotteJeffrey W Byrnett, MD sent at 02/12/2016 12:40 PM EDT ----- Please contact patient and see if diarrhea improved w/ fiber supplements. Thanks.  ----- Message -----    From: Currie ParisMarsha M Icesis Renn, RN    Sent: 01/16/2016  11:01 AM      To: Earline MayotteJeffrey W Byrnett, MD

## 2017-04-03 ENCOUNTER — Other Ambulatory Visit: Payer: Self-pay | Admitting: Orthopedic Surgery

## 2017-04-03 DIAGNOSIS — M1612 Unilateral primary osteoarthritis, left hip: Secondary | ICD-10-CM

## 2017-04-15 ENCOUNTER — Ambulatory Visit
Admission: RE | Admit: 2017-04-15 | Discharge: 2017-04-15 | Disposition: A | Discharge status: Home / Self Care | Payer: Medicare Other | Source: Ambulatory Visit | Attending: Orthopedic Surgery | Admitting: Orthopedic Surgery

## 2017-04-15 DIAGNOSIS — M7061 Trochanteric bursitis, right hip: Secondary | ICD-10-CM | POA: Diagnosis present

## 2017-04-15 DIAGNOSIS — M7062 Trochanteric bursitis, left hip: Secondary | ICD-10-CM | POA: Diagnosis not present

## 2017-04-15 DIAGNOSIS — M1612 Unilateral primary osteoarthritis, left hip: Secondary | ICD-10-CM | POA: Diagnosis not present

## 2017-04-29 ENCOUNTER — Encounter
Admission: RE | Admit: 2017-04-29 | Discharge: 2017-04-29 | Disposition: A | Discharge status: Home / Self Care | Payer: Medicare Other | Source: Ambulatory Visit | Attending: Orthopedic Surgery | Admitting: Orthopedic Surgery

## 2017-04-29 DIAGNOSIS — Z01810 Encounter for preprocedural cardiovascular examination: Secondary | ICD-10-CM | POA: Insufficient documentation

## 2017-04-29 DIAGNOSIS — I10 Essential (primary) hypertension: Secondary | ICD-10-CM | POA: Insufficient documentation

## 2017-04-29 DIAGNOSIS — Z01812 Encounter for preprocedural laboratory examination: Secondary | ICD-10-CM | POA: Diagnosis present

## 2017-04-29 HISTORY — DX: Unspecified asthma, uncomplicated: J45.909

## 2017-04-29 HISTORY — DX: Other complications of anesthesia, initial encounter: T88.59XA

## 2017-04-29 HISTORY — DX: Adverse effect of unspecified anesthetic, initial encounter: T41.45XA

## 2017-04-29 LAB — CBC
HEMATOCRIT: 39.8 % (ref 35.0–47.0)
HEMOGLOBIN: 13.5 g/dL (ref 12.0–16.0)
MCH: 28.9 pg (ref 26.0–34.0)
MCHC: 33.9 g/dL (ref 32.0–36.0)
MCV: 85.3 fL (ref 80.0–100.0)
Platelets: 205 10*3/uL (ref 150–440)
RBC: 4.66 MIL/uL (ref 3.80–5.20)
RDW: 14.1 % (ref 11.5–14.5)
WBC: 4.6 10*3/uL (ref 3.6–11.0)

## 2017-04-29 LAB — BASIC METABOLIC PANEL
Anion gap: 9 (ref 5–15)
BUN: 16 mg/dL (ref 6–20)
CHLORIDE: 101 mmol/L (ref 101–111)
CO2: 29 mmol/L (ref 22–32)
Calcium: 9.6 mg/dL (ref 8.9–10.3)
Creatinine, Ser: 0.87 mg/dL (ref 0.44–1.00)
GFR calc Af Amer: >60 mL/min (ref >60)
GFR calc non Af Amer: >60 mL/min (ref >60)
Glucose, Bld: 89 mg/dL (ref 65–99)
POTASSIUM: 3.8 mmol/L (ref 3.5–5.1)
SODIUM: 139 mmol/L (ref 135–145)

## 2017-04-29 LAB — SURGICAL PCR SCREEN
MRSA, PCR: NEGATIVE
Staphylococcus aureus: POSITIVE — AB

## 2017-04-29 LAB — PROTIME-INR
INR: 1.01
Prothrombin Time: 13.3 seconds (ref 11.4–15.2)

## 2017-04-29 LAB — URINALYSIS, COMPLETE (UACMP) WITH MICROSCOPIC
BACTERIA UA: NONE SEEN
Bilirubin Urine: NEGATIVE
Glucose, UA: NEGATIVE mg/dL
Hgb urine dipstick: NEGATIVE
KETONES UR: NEGATIVE mg/dL
Nitrite: NEGATIVE
PROTEIN: NEGATIVE mg/dL
Specific Gravity, Urine: 1.015 (ref 1.005–1.030)
pH: 7 (ref 5.0–8.0)

## 2017-04-29 LAB — TYPE AND SCREEN
ABO/RH(D): O POS
Antibody Screen: POSITIVE

## 2017-04-29 LAB — APTT: APTT: 39 s — AB (ref 24–36)

## 2017-04-29 LAB — SEDIMENTATION RATE: Sed Rate: 14 mm/hr (ref 0–30)

## 2017-04-29 NOTE — Pre-Procedure Instructions (Signed)
Pt has a area on lower right leg from getting scratched while doing yard work. The area is scabbed over but is also red and the leg has some swelling pt instructed to let Dr. Rosita KeaMenz see the area. Pt was going to PCP tomorrow and would also talk to Dr Rosita KeaMenz office. Pt has an appt scheduled with Dr Rosita KeaMenz on May 08, 2017

## 2017-04-29 NOTE — Patient Instructions (Signed)
Your procedure is scheduled on: May 12, 2017  Report to SECOND FLOOR OF THE MEDICAL MALL COME REVOLVING DOOR  To find out your arrival time please call [phone] between 1PM - 3PM on May 11, 2017  Remember: Instructions that are not followed completely may result in serious medical risk, up to and including death, or upon the discretion of your surgeon and anesthesiologist your surgery may need to be rescheduled.     ___x_ 1. Do not eat food or drink liquids after midnight. No gum chewing or hard candies.        __x__ 2. No alcohol for 24 hours before or after surgery.      __x__ 3. Bring all medications with you on the day of surgery if instructed.             __x__ 4. Notify your doctor if there is any change in your medical condition (cold, fever,                             infections). Pt instructed to let Dr Rosita KeaMenz see area on rt leg that is red and swollen from scratch while working in the yard.     Do not wear lotions, powders, or perfumes. You may not wear deodorant.     Do not shave 48 hours prior to surgery. Men may shave face and neck.     Do not bring valuables to the hospital.    [facility] belongings or valuables.                        Contacts, dentures or bridgework may not be worn into surgery.    Leave your suitcase in the car. After surgery it may be brought to your room.    For patients admitted to the hospital, discharge time is determined by your treatment team.   Patients discharged the day of surgery will not be allowed to drive home.    Please read over the following fact sheets that you were given:   Chg info and MRSA info   __x__ Take these medicines the morning of surgery with A SIP OF WATER:           1. AMLODIPINE  2. XANAX IF NEEDED   3. CARVEDILOL  4  OMEPRAZOLE TAKE A DOSE THE NIGHT BEFORE SURGERY AND THE MORNING OF SURGERY     5. ONLY TAKE NORCO IF NECESSARY  6.    __x__ Use CHG Soap as directed    __x__ Use inhalers on the day of surgery          __x__ Stop aspirin until after surgery   __x__ Stop Anti-inflammatories until after surgery advil, motrin, aleve, ibuprofen, goody's powder, anaprox           __x__ Stop supplements until after surgery  VITAMIN C

## 2017-04-30 LAB — URINE CULTURE

## 2017-04-30 NOTE — Pre-Procedure Instructions (Signed)
Nasal swab results faxed to Dr. Rosita KeaMenz for review.

## 2017-05-11 MED ORDER — CLINDAMYCIN PHOSPHATE 900 MG/50ML IV SOLN
900.0000 mg | Freq: Once | INTRAVENOUS | Status: AC
  Administered 2017-05-12: 900 mg via INTRAVENOUS

## 2017-05-12 ENCOUNTER — Inpatient Hospital Stay: Payer: Medicare Other

## 2017-05-12 ENCOUNTER — Inpatient Hospital Stay: Payer: Medicare Other | Admitting: Certified Registered"

## 2017-05-12 ENCOUNTER — Encounter
Admission: RE | Disposition: A | Discharge status: Skilled Nursing Facility | Payer: Self-pay | Source: Ambulatory Visit | Attending: Orthopedic Surgery

## 2017-05-12 ENCOUNTER — Encounter: Payer: Self-pay | Admitting: Anesthesiology

## 2017-05-12 ENCOUNTER — Inpatient Hospital Stay
Admission: RE | Admit: 2017-05-12 | Discharge: 2017-05-15 | DRG: 470 | Disposition: A | Discharge status: Skilled Nursing Facility | Payer: Medicare Other | Source: Ambulatory Visit | Attending: Orthopedic Surgery | Admitting: Orthopedic Surgery

## 2017-05-12 DIAGNOSIS — J45909 Unspecified asthma, uncomplicated: Secondary | ICD-10-CM | POA: Diagnosis present

## 2017-05-12 DIAGNOSIS — I10 Essential (primary) hypertension: Secondary | ICD-10-CM | POA: Diagnosis present

## 2017-05-12 DIAGNOSIS — F419 Anxiety disorder, unspecified: Secondary | ICD-10-CM | POA: Diagnosis present

## 2017-05-12 DIAGNOSIS — Z885 Allergy status to narcotic agent status: Secondary | ICD-10-CM | POA: Diagnosis not present

## 2017-05-12 DIAGNOSIS — K449 Diaphragmatic hernia without obstruction or gangrene: Secondary | ICD-10-CM | POA: Diagnosis present

## 2017-05-12 DIAGNOSIS — Z87891 Personal history of nicotine dependence: Secondary | ICD-10-CM | POA: Diagnosis not present

## 2017-05-12 DIAGNOSIS — Z419 Encounter for procedure for purposes other than remedying health state, unspecified: Secondary | ICD-10-CM

## 2017-05-12 DIAGNOSIS — E785 Hyperlipidemia, unspecified: Secondary | ICD-10-CM | POA: Diagnosis present

## 2017-05-12 DIAGNOSIS — G2581 Restless legs syndrome: Secondary | ICD-10-CM | POA: Diagnosis present

## 2017-05-12 DIAGNOSIS — M1612 Unilateral primary osteoarthritis, left hip: Secondary | ICD-10-CM | POA: Diagnosis present

## 2017-05-12 DIAGNOSIS — Z79899 Other long term (current) drug therapy: Secondary | ICD-10-CM | POA: Diagnosis not present

## 2017-05-12 DIAGNOSIS — Z880 Allergy status to penicillin: Secondary | ICD-10-CM

## 2017-05-12 DIAGNOSIS — G8918 Other acute postprocedural pain: Secondary | ICD-10-CM

## 2017-05-12 HISTORY — PX: TOTAL HIP ARTHROPLASTY: SHX124

## 2017-05-12 LAB — CBC
HCT: 40 % (ref 35.0–47.0)
Hemoglobin: 13.5 g/dL (ref 12.0–16.0)
MCH: 28.8 pg (ref 26.0–34.0)
MCHC: 33.8 g/dL (ref 32.0–36.0)
MCV: 85 fL (ref 80.0–100.0)
PLATELETS: 200 10*3/uL (ref 150–440)
RBC: 4.71 MIL/uL (ref 3.80–5.20)
RDW: 13.9 % (ref 11.5–14.5)
WBC: 10.5 10*3/uL (ref 3.6–11.0)

## 2017-05-12 LAB — CREATININE, SERUM
CREATININE: 0.91 mg/dL (ref 0.44–1.00)
GFR calc Af Amer: >60 mL/min (ref >60)
GFR calc non Af Amer: 59 mL/min — ABNORMAL LOW (ref >60)

## 2017-05-12 LAB — ABO/RH: ABO/RH(D): O POS

## 2017-05-12 SURGERY — ARTHROPLASTY, HIP, TOTAL, ANTERIOR APPROACH
Anesthesia: Spinal | Site: Hip | Laterality: Left | Wound class: Clean

## 2017-05-12 MED ORDER — MIDAZOLAM HCL 2 MG/2ML IJ SOLN
INTRAMUSCULAR | Status: AC
  Filled 2017-05-12: qty 2

## 2017-05-12 MED ORDER — ONDANSETRON HCL 4 MG/2ML IJ SOLN: 4.0000 mg | Freq: Once | INTRAMUSCULAR | Status: DC | PRN

## 2017-05-12 MED ORDER — SODIUM CHLORIDE 0.9 % IV SOLN
INTRAVENOUS | Status: DC | PRN
  Administered 2017-05-12: 30 ug/min via INTRAVENOUS

## 2017-05-12 MED ORDER — TRANEXAMIC ACID 1000 MG/10ML IV SOLN
1000.0000 mg | INTRAVENOUS | Status: AC
  Administered 2017-05-12: 1000 mg via INTRAVENOUS
  Filled 2017-05-12: qty 10

## 2017-05-12 MED ORDER — DIPHENOXYLATE-ATROPINE 2.5-0.025 MG PO TABS: 2.0000 | ORAL_TABLET | Freq: Four times a day (QID) | ORAL | Status: DC | PRN

## 2017-05-12 MED ORDER — BISACODYL 10 MG RE SUPP: 10.0000 mg | Freq: Every day | RECTAL | Status: DC | PRN

## 2017-05-12 MED ORDER — PHENOL 1.4 % MT LIQD
1.0000 | OROMUCOSAL | Status: DC | PRN
Start: 2017-05-12 — End: 2017-05-15
  Filled 2017-05-12: qty 177

## 2017-05-12 MED ORDER — LIDOCAINE HCL (PF) 2 % IJ SOLN
INTRAMUSCULAR | Status: DC | PRN
  Administered 2017-05-12: 50 mg

## 2017-05-12 MED ORDER — LIDOCAINE HCL (PF) 2 % IJ SOLN
INTRAMUSCULAR | Status: AC
  Filled 2017-05-12: qty 2

## 2017-05-12 MED ORDER — PROPOFOL 10 MG/ML IV BOLUS
INTRAVENOUS | Status: AC
  Filled 2017-05-12: qty 20

## 2017-05-12 MED ORDER — KETAMINE HCL 10 MG/ML IJ SOLN
INTRAMUSCULAR | Status: DC | PRN
  Administered 2017-05-12: 25 mg via INTRAVENOUS

## 2017-05-12 MED ORDER — CLINDAMYCIN PHOSPHATE 900 MG/50ML IV SOLN
INTRAVENOUS | Status: AC
  Filled 2017-05-12: qty 50

## 2017-05-12 MED ORDER — ACETAMINOPHEN 325 MG PO TABS
650.0000 mg | ORAL_TABLET | Freq: Four times a day (QID) | ORAL | Status: DC | PRN
  Administered 2017-05-13 – 2017-05-15 (×3): 650 mg via ORAL
  Filled 2017-05-12 (×3): qty 2

## 2017-05-12 MED ORDER — PANCRELIPASE (LIP-PROT-AMYL) 12000-38000 UNITS PO CPEP
36000.0000 [IU] | ORAL_CAPSULE | Freq: Three times a day (TID) | ORAL | Status: DC
  Administered 2017-05-13 – 2017-05-15 (×8): 36000 [IU] via ORAL
  Filled 2017-05-12 (×7): qty 3
  Filled 2017-05-12: qty 1
  Filled 2017-05-12 (×2): qty 3

## 2017-05-12 MED ORDER — OXYCODONE HCL ER 15 MG PO T12A
15.0000 mg | EXTENDED_RELEASE_TABLET | Freq: Two times a day (BID) | ORAL | Status: DC
  Administered 2017-05-12 – 2017-05-15 (×7): 15 mg via ORAL
  Filled 2017-05-12 (×7): qty 1

## 2017-05-12 MED ORDER — ACETAMINOPHEN 650 MG RE SUPP: 650.0000 mg | Freq: Four times a day (QID) | RECTAL | Status: DC | PRN

## 2017-05-12 MED ORDER — ENOXAPARIN SODIUM 40 MG/0.4ML ~~LOC~~ SOLN
40.0000 mg | SUBCUTANEOUS | Status: DC
  Administered 2017-05-13 – 2017-05-15 (×3): 40 mg via SUBCUTANEOUS
  Filled 2017-05-12 (×3): qty 0.4

## 2017-05-12 MED ORDER — BUPIVACAINE-EPINEPHRINE 0.25% -1:200000 IJ SOLN
INTRAMUSCULAR | Status: DC | PRN
  Administered 2017-05-12: 30 mL

## 2017-05-12 MED ORDER — ZOLPIDEM TARTRATE 5 MG PO TABS: 5.0000 mg | ORAL_TABLET | Freq: Every evening | ORAL | Status: DC | PRN

## 2017-05-12 MED ORDER — ACETAMINOPHEN 500 MG PO TABS
1000.0000 mg | ORAL_TABLET | Freq: Four times a day (QID) | ORAL | Status: AC
  Administered 2017-05-12 – 2017-05-13 (×4): 1000 mg via ORAL
  Filled 2017-05-12 (×4): qty 2

## 2017-05-12 MED ORDER — CYCLOBENZAPRINE HCL 10 MG PO TABS
5.0000 mg | ORAL_TABLET | Freq: Three times a day (TID) | ORAL | Status: DC | PRN
  Administered 2017-05-12 – 2017-05-14 (×2): 5 mg via ORAL
  Filled 2017-05-12 (×3): qty 1

## 2017-05-12 MED ORDER — DOCUSATE SODIUM 100 MG PO CAPS
100.0000 mg | ORAL_CAPSULE | Freq: Two times a day (BID) | ORAL | Status: DC
  Administered 2017-05-12 – 2017-05-15 (×6): 100 mg via ORAL
  Filled 2017-05-12 (×6): qty 1

## 2017-05-12 MED ORDER — SODIUM CHLORIDE 0.9 % IV SOLN
INTRAVENOUS | Status: DC
Start: 2017-05-12 — End: 2017-05-13
  Administered 2017-05-12 – 2017-05-13 (×2): via INTRAVENOUS

## 2017-05-12 MED ORDER — METHOCARBAMOL 1000 MG/10ML IJ SOLN
500.0000 mg | Freq: Four times a day (QID) | INTRAVENOUS | Status: DC | PRN
  Filled 2017-05-12: qty 5

## 2017-05-12 MED ORDER — NEOMYCIN-POLYMYXIN B GU 40-200000 IR SOLN
Status: DC | PRN
  Administered 2017-05-12: 4 mL

## 2017-05-12 MED ORDER — ONDANSETRON HCL 4 MG PO TABS: 4.0000 mg | ORAL_TABLET | Freq: Four times a day (QID) | ORAL | Status: DC | PRN

## 2017-05-12 MED ORDER — ALUM & MAG HYDROXIDE-SIMETH 200-200-20 MG/5ML PO SUSP: 30.0000 mL | ORAL | Status: DC | PRN

## 2017-05-12 MED ORDER — METOCLOPRAMIDE HCL 5 MG/ML IJ SOLN: 5.0000 mg | Freq: Three times a day (TID) | INTRAMUSCULAR | Status: DC | PRN

## 2017-05-12 MED ORDER — MIDAZOLAM HCL 5 MG/5ML IJ SOLN
INTRAMUSCULAR | Status: DC | PRN
  Administered 2017-05-12 (×2): 1 mg via INTRAVENOUS

## 2017-05-12 MED ORDER — CLINDAMYCIN PHOSPHATE 900 MG/50ML IV SOLN
900.0000 mg | Freq: Four times a day (QID) | INTRAVENOUS | Status: AC
  Administered 2017-05-12 – 2017-05-13 (×2): 900 mg via INTRAVENOUS
  Filled 2017-05-12 (×3): qty 50

## 2017-05-12 MED ORDER — LACTATED RINGERS IV SOLN
INTRAVENOUS | Status: DC
Start: 2017-05-12 — End: 2017-05-12
  Administered 2017-05-12: 09:00:00 via INTRAVENOUS

## 2017-05-12 MED ORDER — AMLODIPINE BESYLATE 5 MG PO TABS
5.0000 mg | ORAL_TABLET | Freq: Every day | ORAL | Status: DC
  Administered 2017-05-13 – 2017-05-15 (×3): 5 mg via ORAL
  Filled 2017-05-12 (×3): qty 1

## 2017-05-12 MED ORDER — CARVEDILOL 3.125 MG PO TABS
6.2500 mg | ORAL_TABLET | Freq: Two times a day (BID) | ORAL | Status: DC
  Administered 2017-05-12 – 2017-05-15 (×6): 6.25 mg via ORAL
  Filled 2017-05-12 (×6): qty 2

## 2017-05-12 MED ORDER — MAGNESIUM HYDROXIDE 400 MG/5ML PO SUSP
30.0000 mL | Freq: Every day | ORAL | Status: DC | PRN
  Administered 2017-05-13: 30 mL via ORAL
  Filled 2017-05-12: qty 30

## 2017-05-12 MED ORDER — METHOCARBAMOL 500 MG PO TABS
500.0000 mg | ORAL_TABLET | Freq: Four times a day (QID) | ORAL | Status: DC | PRN
  Administered 2017-05-12 – 2017-05-13 (×3): 500 mg via ORAL
  Filled 2017-05-12 (×3): qty 1

## 2017-05-12 MED ORDER — FENTANYL CITRATE (PF) 100 MCG/2ML IJ SOLN
INTRAMUSCULAR | Status: AC
  Filled 2017-05-12: qty 2

## 2017-05-12 MED ORDER — ONDANSETRON HCL 4 MG/2ML IJ SOLN
4.0000 mg | Freq: Four times a day (QID) | INTRAMUSCULAR | Status: DC | PRN
  Administered 2017-05-12: 4 mg via INTRAVENOUS
  Filled 2017-05-12: qty 2

## 2017-05-12 MED ORDER — PHENYLEPHRINE HCL 10 MG/ML IJ SOLN
INTRAMUSCULAR | Status: DC | PRN
  Administered 2017-05-12 (×3): 100 ug via INTRAVENOUS

## 2017-05-12 MED ORDER — PROPOFOL 500 MG/50ML IV EMUL
INTRAVENOUS | Status: DC | PRN
  Administered 2017-05-12: 25 ug/kg/min via INTRAVENOUS

## 2017-05-12 MED ORDER — FENTANYL CITRATE (PF) 100 MCG/2ML IJ SOLN: 25.0000 ug | INTRAMUSCULAR | Status: DC | PRN

## 2017-05-12 MED ORDER — ALBUTEROL SULFATE (2.5 MG/3ML) 0.083% IN NEBU
2.5000 mg | INHALATION_SOLUTION | Freq: Four times a day (QID) | RESPIRATORY_TRACT | Status: DC | PRN
  Administered 2017-05-13: 2.5 mg via RESPIRATORY_TRACT
  Filled 2017-05-12: qty 3

## 2017-05-12 MED ORDER — MORPHINE SULFATE (PF) 2 MG/ML IV SOLN
2.0000 mg | INTRAVENOUS | Status: DC | PRN
  Administered 2017-05-12: 2 mg via INTRAVENOUS
  Filled 2017-05-12: qty 1

## 2017-05-12 MED ORDER — OXYCODONE HCL 5 MG PO TABS
5.0000 mg | ORAL_TABLET | ORAL | Status: DC | PRN
  Administered 2017-05-12: 10 mg via ORAL
  Administered 2017-05-12: 5 mg via ORAL
  Administered 2017-05-14: 10 mg via ORAL
  Filled 2017-05-12: qty 2
  Filled 2017-05-12: qty 1
  Filled 2017-05-12: qty 2
  Filled 2017-05-12: qty 1

## 2017-05-12 MED ORDER — MAGNESIUM CITRATE PO SOLN
1.0000 | Freq: Once | ORAL | Status: DC | PRN
  Filled 2017-05-12: qty 296

## 2017-05-12 MED ORDER — FENTANYL CITRATE (PF) 100 MCG/2ML IJ SOLN
INTRAMUSCULAR | Status: DC | PRN
  Administered 2017-05-12: 50 ug via INTRAVENOUS
  Administered 2017-05-12 (×2): 25 ug via INTRAVENOUS

## 2017-05-12 MED ORDER — VITAMIN D 1000 UNITS PO TABS
1000.0000 [IU] | ORAL_TABLET | Freq: Every evening | ORAL | Status: DC
  Administered 2017-05-12 – 2017-05-14 (×3): 1000 [IU] via ORAL
  Filled 2017-05-12 (×3): qty 1

## 2017-05-12 MED ORDER — BUPIVACAINE HCL (PF) 0.5 % IJ SOLN
INTRAMUSCULAR | Status: DC | PRN
  Administered 2017-05-12: 3 mL via INTRATHECAL

## 2017-05-12 MED ORDER — ALBUTEROL SULFATE HFA 108 (90 BASE) MCG/ACT IN AERS: 2.0000 | INHALATION_SPRAY | Freq: Four times a day (QID) | RESPIRATORY_TRACT | Status: DC | PRN

## 2017-05-12 MED ORDER — DIPHENHYDRAMINE HCL 12.5 MG/5ML PO ELIX: 12.5000 mg | ORAL_SOLUTION | ORAL | Status: DC | PRN

## 2017-05-12 MED ORDER — ALPRAZOLAM 0.5 MG PO TABS
0.5000 mg | ORAL_TABLET | Freq: Three times a day (TID) | ORAL | Status: DC | PRN
  Administered 2017-05-12 – 2017-05-13 (×3): 0.5 mg via ORAL
  Filled 2017-05-12 (×3): qty 1

## 2017-05-12 MED ORDER — NEOMYCIN-POLYMYXIN B GU 40-200000 IR SOLN
Status: AC
  Filled 2017-05-12: qty 20

## 2017-05-12 MED ORDER — BUPIVACAINE HCL (PF) 0.5 % IJ SOLN
INTRAMUSCULAR | Status: AC
  Filled 2017-05-12: qty 10

## 2017-05-12 MED ORDER — BUPIVACAINE-EPINEPHRINE (PF) 0.25% -1:200000 IJ SOLN
INTRAMUSCULAR | Status: AC
  Filled 2017-05-12: qty 30

## 2017-05-12 MED ORDER — VITAMIN C 500 MG PO TABS
1000.0000 mg | ORAL_TABLET | Freq: Every evening | ORAL | Status: DC
  Administered 2017-05-12 – 2017-05-14 (×3): 1000 mg via ORAL
  Filled 2017-05-12 (×4): qty 2

## 2017-05-12 MED ORDER — CARISOPRODOL 350 MG PO TABS: 175.0000 mg | ORAL_TABLET | Freq: Three times a day (TID) | ORAL | Status: DC | PRN

## 2017-05-12 MED ORDER — FERROUS SULFATE 325 (65 FE) MG PO TABS
325.0000 mg | ORAL_TABLET | Freq: Every evening | ORAL | Status: DC
Start: 2017-05-12 — End: 2017-05-15
  Administered 2017-05-12 – 2017-05-14 (×3): 325 mg via ORAL
  Filled 2017-05-12 (×3): qty 1

## 2017-05-12 MED ORDER — PANTOPRAZOLE SODIUM 40 MG PO TBEC
40.0000 mg | DELAYED_RELEASE_TABLET | Freq: Every day | ORAL | Status: DC
  Administered 2017-05-13 – 2017-05-15 (×3): 40 mg via ORAL
  Filled 2017-05-12 (×3): qty 1

## 2017-05-12 MED ORDER — MENTHOL 3 MG MT LOZG
1.0000 | LOZENGE | OROMUCOSAL | Status: DC | PRN
  Filled 2017-05-12: qty 9

## 2017-05-12 MED ORDER — GLYCOPYRROLATE 0.2 MG/ML IJ SOLN
INTRAMUSCULAR | Status: DC | PRN
  Administered 2017-05-12: 0.2 mg via INTRAVENOUS

## 2017-05-12 MED ORDER — METOCLOPRAMIDE HCL 10 MG PO TABS: 5.0000 mg | ORAL_TABLET | Freq: Three times a day (TID) | ORAL | Status: DC | PRN

## 2017-05-12 MED ORDER — GLYCOPYRROLATE 0.2 MG/ML IJ SOLN
INTRAMUSCULAR | Status: AC
  Filled 2017-05-12: qty 1

## 2017-05-12 SURGICAL SUPPLY — 50 items
BLADE SAW SAG 18.5X105 (BLADE) ×3 IMPLANT
BNDG COHESIVE 6X5 TAN STRL LF (GAUZE/BANDAGES/DRESSINGS) ×9 IMPLANT
CANISTER SUCT 1200ML W/VALVE (MISCELLANEOUS) ×3 IMPLANT
CAPT HIP TOTAL 3 ×3 IMPLANT
CATH FOL LEG HOLDER (MISCELLANEOUS) ×3 IMPLANT
CATH TRAY METER 16FR LF (MISCELLANEOUS) ×3 IMPLANT
CHLORAPREP W/TINT 26ML (MISCELLANEOUS) ×3 IMPLANT
DRAPE C-ARM XRAY 36X54 (DRAPES) ×3 IMPLANT
DRAPE INCISE IOBAN 66X60 STRL (DRAPES) IMPLANT
DRAPE POUCH INSTRU U-SHP 10X18 (DRAPES) ×3 IMPLANT
DRAPE SHEET LG 3/4 BI-LAMINATE (DRAPES) ×9 IMPLANT
DRAPE TABLE BACK 80X90 (DRAPES) ×3 IMPLANT
DRESSING SURGICEL FIBRLLR 1X2 (HEMOSTASIS) ×2 IMPLANT
DRSG OPSITE POSTOP 4X8 (GAUZE/BANDAGES/DRESSINGS) ×6 IMPLANT
DRSG SURGICEL FIBRILLAR 1X2 (HEMOSTASIS) ×6
ELECT BLADE 6.5 EXT (BLADE) ×3 IMPLANT
ELECT REM PT RETURN 9FT ADLT (ELECTROSURGICAL) ×3
ELECTRODE REM PT RTRN 9FT ADLT (ELECTROSURGICAL) ×1 IMPLANT
GLOVE BIOGEL PI IND STRL 9 (GLOVE) ×1 IMPLANT
GLOVE BIOGEL PI INDICATOR 9 (GLOVE) ×2
GLOVE SURG SYN 9.0  PF PI (GLOVE) ×4
GLOVE SURG SYN 9.0 PF PI (GLOVE) ×2 IMPLANT
GOWN SRG 2XL LVL 4 RGLN SLV (GOWNS) ×1 IMPLANT
GOWN STRL NON-REIN 2XL LVL4 (GOWNS) ×2
GOWN STRL REUS W/ TWL LRG LVL3 (GOWN DISPOSABLE) ×1 IMPLANT
GOWN STRL REUS W/TWL LRG LVL3 (GOWN DISPOSABLE) ×2
HEMOVAC 400CC 10FR (MISCELLANEOUS) IMPLANT
HOOD PEEL AWAY FLYTE STAYCOOL (MISCELLANEOUS) ×3 IMPLANT
KIT PREVENA INCISION MGT 13 (CANNISTER) ×3 IMPLANT
MAT BLUE FLOOR 46X72 FLO (MISCELLANEOUS) ×3 IMPLANT
NDL SAFETY 18GX1.5 (NEEDLE) ×3 IMPLANT
NEEDLE SPNL 18GX3.5 QUINCKE PK (NEEDLE) ×3 IMPLANT
NS IRRIG 1000ML POUR BTL (IV SOLUTION) ×3 IMPLANT
PACK HIP COMPR (MISCELLANEOUS) ×3 IMPLANT
SOL PREP PVP 2OZ (MISCELLANEOUS) ×3
SOLUTION PREP PVP 2OZ (MISCELLANEOUS) ×1 IMPLANT
SPONGE DRAIN TRACH 4X4 STRL 2S (GAUZE/BANDAGES/DRESSINGS) ×3 IMPLANT
STAPLER SKIN PROX 35W (STAPLE) ×3 IMPLANT
STRAP SAFETY BODY (MISCELLANEOUS) ×3 IMPLANT
SUT DVC 2 QUILL PDO  T11 36X36 (SUTURE) ×2
SUT DVC 2 QUILL PDO T11 36X36 (SUTURE) ×1 IMPLANT
SUT SILK 0 (SUTURE) ×2
SUT SILK 0 30XBRD TIE 6 (SUTURE) ×1 IMPLANT
SUT V-LOC 90 ABS DVC 3-0 CL (SUTURE) ×3 IMPLANT
SUT VIC AB 1 CT1 36 (SUTURE) ×3 IMPLANT
SYR 20CC LL (SYRINGE) ×3 IMPLANT
SYR 30ML LL (SYRINGE) ×3 IMPLANT
TAPE MICROFOAM 4IN (TAPE) ×3 IMPLANT
TOWEL OR 17X26 4PK STRL BLUE (TOWEL DISPOSABLE) ×3 IMPLANT
WND VAC CANISTER 500ML (MISCELLANEOUS) ×3 IMPLANT

## 2017-05-12 NOTE — Evaluation (Signed)
Physical Therapy Evaluation Patient Details Name: Amanda [name] MRN: [id number] DOB: 1941-07-15 Today's Date: 05/12/2017   History of Present Illness  76 y/o s/p a L THA (anterior approach) performed on 05/12/17. PMH includes asthma, HTN, anxiety, hiatal hernia, and anemia.    Clinical Impression  Pt is a pleasant 76 year old POD 0 for a L THA (anterior approach). Pt previously independent in all ADL's and ambulated without an AD. Pt performs bed mobility with min assist (for LLE management and transfers with min guard (sit to/from stand). Pt ambulates with min guard. Ambulated from EOB to recliner (5ft) with RW and verbal cueing for sequencing. Pt expressed intense pain throughout today's session (8/10 to 10/10), but was able to proceed with mobilization from bed to recliner with encouragement and cueing.  This session performed on room air - SAO2 at 98% after mobilizing to recliner. RN notified pt left on room air. Pt demonstrates deficits in pain, activity tolerance, and strength. Would benefit from skilled PT to address above deficits and promote optimal return to PLOF. Pt is motivated to participate in therapy. PT recommends dc home with home health PT to address the above deficits. Pt and pt's husband agreeable to this POC.     Follow Up Recommendations Home health PT    Equipment Recommendations  3in1 (PT)    Recommendations for Other Services       Precautions / Restrictions Precautions Precautions: Fall Restrictions Weight Bearing Restrictions: Yes LLE Weight Bearing: Weight bearing as tolerated      Mobility  Bed Mobility Overal bed mobility: Needs Assistance Bed Mobility: Supine to Sit     Supine to sit: Min assist     General bed mobility comments: Min assist for LLE management. Pt pulled on handrail and PT to get EOB. No dizziness noted once EOB. Pt independent with scooting to EOB. Performed on room air.  Transfers Overall transfer level: Needs  assistance Equipment used: Rolling walker (2 wheeled) Transfers: Sit to/from Stand Sit to Stand: Min guard         General transfer comment: Pt slow to move sit to stand, but able to do so without physical assist. Verbal cueing for hand placement and encouragement. Pt able to put roughly 50% weight through LLE. No dizziness noted once standing. Performed on room air.  Ambulation/Gait Ambulation/Gait assistance: Min guard Ambulation Distance (Feet): 3 Feet Assistive device: Rolling walker (2 wheeled) Gait Pattern/deviations: Step-to pattern;Decreased stance time - left;Trunk flexed;Wide base of support;Antalgic     General Gait Details: Ambulated EOB to recliner (56ft) with RW and min guard. Pt steady on her feet and able to utilize RW with min verbal cueing. Performed on room air.  Stairs            Wheelchair Mobility    Modified Rankin (Stroke Patients Only)       Balance Overall balance assessment: No apparent balance deficits (not formally assessed);History of Falls (Pt states if she looks up, she'll tend to fall backwards-2x)                                           Pertinent Vitals/Pain Pain Assessment: 0-10 Pain Score: 8  Pain Location: L hip Pain Descriptors / Indicators: Operative site guarding;Moaning;Guarding;Grimacing Pain Intervention(s): Limited activity within patient's tolerance;Monitored during session;Repositioned;Premedicated before session;Ice applied    Home Living Family/patient expects to be discharged to:: Private  residence Living Arrangements: Spouse/significant other Available Help at Discharge: Family;Available 24 hours/day Type of Home: House Home Access: Stairs to enter Entrance Stairs-Rails: Right Entrance Stairs-Number of Steps: 4 Home Layout: One level Home Equipment: Walker - 2 wheels;Walker - standard Additional Comments: Stairs in front of house have 6 steps with B railings.    Prior Function Level of  Independence: Independent         Comments: Ambulated without an AD. Pt very active (gardens, walks, shops, etc.) and independent in all ADL's.     Hand Dominance        Extremity/Trunk Assessment   Upper Extremity Assessment Upper Extremity Assessment: Overall WFL for tasks assessed (4+/5 B for elbow flex/ext and grip)    Lower Extremity Assessment Lower Extremity Assessment: Overall WFL for tasks assessed (RLE 4+/5 for DF/PF and knee flex/ext. LLE not tested.)       Communication   Communication: No difficulties  Cognition Arousal/Alertness: Awake/alert Behavior During Therapy: WFL for tasks assessed/performed Overall Cognitive Status: Within Functional Limits for tasks assessed                                 General Comments: Pt very nervous about mobilizing, but able to do so successfully with cueing and encouragement.      General Comments      Exercises Other Exercises Other Exercises: Supine ther-ex x10 on RLE includes: ankle pumps B, SLR's, and hip abduction. Performed with supervision and verbal cueing. Pt unable to perform hip abd or quad set due to pain on LLE.     Assessment/Plan    PT Assessment Patient needs continued PT services  PT Problem List Decreased strength;Decreased range of motion;Decreased activity tolerance;Pain       PT Treatment Interventions Gait training;Stair training;Therapeutic activities;Therapeutic exercise;Balance training;Patient/family education    PT Goals (Current goals can be found in the Care Plan section)  Acute Rehab PT Goals Patient Stated Goal: to go home PT Goal Formulation: With patient Time For Goal Achievement: 05/26/17 Potential to Achieve Goals: Good    Frequency BID   Barriers to discharge        Co-evaluation               AM-PAC PT "6 Clicks" Daily Activity  Outcome Measure Difficulty turning over in bed (including adjusting bedclothes, sheets and blankets)?: A  Little Difficulty moving from lying on back to sitting on the side of the bed? : Total Difficulty sitting down on and standing up from a chair with arms (e.g., wheelchair, bedside commode, etc,.)?: Total Help needed moving to and from a bed to chair (including a wheelchair)?: A Little Help needed walking in hospital room?: A Lot Help needed climbing 3-5 steps with a railing? : Total 6 Click Score: 11    End of Session Equipment Utilized During Treatment: Gait belt Activity Tolerance: Patient limited by pain Patient left: in chair;with call bell/phone within reach;with chair alarm set;with family/visitor present;with SCD's reapplied Nurse Communication: Mobility status (O2 left off - SAO2 at 98% at end of session) PT Visit Diagnosis: Other abnormalities of gait and mobility (R26.89);History of falling (Z91.81);Muscle weakness (generalized) (M62.81);Pain Pain - Right/Left: Left Pain - part of body: Hip    Time: 1610-9604 PT Time Calculation (min) (ACUTE ONLY): 34 min   Charges:         PT G Codes:        Mabeline Caras, PT,  SPT  Amanda [name] 05/12/2017, 4:12 PM

## 2017-05-12 NOTE — Anesthesia Post-op Follow-up Note (Cosign Needed)
Anesthesia QCDR form completed.        

## 2017-05-12 NOTE — H&P (Signed)
Reviewed paper H+P, will be scanned into chart. Patient examined No changes noted.  

## 2017-05-12 NOTE — Anesthesia Procedure Notes (Signed)
Spinal  Patient location during procedure: OR Staffing Anesthesiologist: Gunnar Bulla Resident/CRNA: Rolla Plate Performed: resident/CRNA  Preanesthetic Checklist Completed: patient identified, site marked, surgical consent, pre-op evaluation, timeout performed, IV checked, risks and benefits discussed and monitors and equipment checked Spinal Block Patient position: sitting Prep: ChloraPrep and site prepped and draped Patient monitoring: heart rate, continuous pulse ox, blood pressure and cardiac monitor Approach: midline Location: L4-5 Injection technique: single-shot Needle Needle type: Introducer and Pencan  Needle gauge: 24 G Needle length: 9 cm Assessment Sensory level: T10 Additional Notes Negative paresthesia. Negative blood return. Positive free-flowing CSF. Expiration date of kit checked and confirmed. Patient tolerated procedure well, without complications.

## 2017-05-12 NOTE — NC FL2 (Signed)
[place] MEDICAID FL2 LEVEL OF CARE SCREENING TOOL     IDENTIFICATION  Patient Name: Amanda [name] Birthdate: 05/03/1941 Sex: female Admission Date (Current Location): 05/12/2017  [place] Number:  Chiropodist and Address:  [facility], [address] [place], [place] [address]      Provider Number: [id number]  Attending Physician Name and Address:  Kennedy Bucker, MD  Relative Name and Phone Number:       Current Level of Care: Hospital Recommended Level of Care: Skilled Nursing Facility Prior Approval Number:    Date Approved/Denied:   PASRR Number:  [id number])  Discharge Plan: SNF    Current Diagnoses: Patient Active Problem List   Diagnosis Date Noted  . Primary localized osteoarthrosis of left hip 05/12/2017  . Diarrhea 01/09/2016  . 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 Absolute anemia 02/24/2015  . Acute bronchitis 02/24/2015  . Fracture of styloid process of radius 04/13/2014    Orientation RESPIRATION BLADDER Height & Weight     Self, Time, Situation, Place  Normal Continent Weight: 190 lb (86.2 kg) Height:  5' (152.4 cm)  BEHAVIORAL SYMPTOMS/MOOD NEUROLOGICAL BOWEL NUTRITION STATUS   (none)  (none) Continent Diet (Diet: Clear Liquid to be advanced. )  AMBULATORY STATUS COMMUNICATION OF NEEDS Skin   Extensive Assist Verbally Surgical wounds, Wound Vac (Incision: Left Hip, Provena wound vac. )                       Personal Care Assistance Level of Assistance  Bathing, Feeding, Dressing Bathing Assistance: Limited assistance Feeding assistance: Independent Dressing Assistance: Limited assistance     Functional Limitations Info  Sight, Hearing, Speech Sight Info: Adequate Hearing Info: Adequate Speech Info: Adequate    SPECIAL CARE FACTORS FREQUENCY  PT (By licensed PT), OT (By licensed OT)     PT Frequency:  (5) OT Frequency:  (5)            Contractures      Additional Factors Info   Code Status, Allergies Code Status Info:  (Full Code. ) Allergies Info:  (Other, Codeine, Penicillins)           Current Medications (05/12/2017):  This is the current hospital active medication list Current Facility-Administered Medications  Medication Dose Route Frequency Provider Last Rate Last Dose  . 0.9 %  sodium chloride infusion   Intravenous Continuous Kennedy Bucker, MD 75 mL/hr at 05/12/17 1306    . acetaminophen (TYLENOL) tablet 650 mg  650 mg Oral Q6H PRN Kennedy Bucker, MD       Or  . acetaminophen (TYLENOL) suppository 650 mg  650 mg Rectal Q6H PRN Kennedy Bucker, MD      . acetaminophen (TYLENOL) tablet 1,000 mg  1,000 mg Oral Q6H Kennedy Bucker, MD   1,000 mg at 05/12/17 1310  . albuterol (PROVENTIL) (2.5 MG/3ML) 0.083% nebulizer solution 2.5 mg  2.5 mg Nebulization Q6H PRN Kennedy Bucker, MD      . ALPRAZolam Prudy Feeler) tablet 0.5 mg  0.5 mg Oral TID PRN Kennedy Bucker, MD      . alum & mag hydroxide-simeth (MAALOX/MYLANTA) 200-200-20 MG/5ML suspension 30 mL  30 mL Oral Q4H PRN Kennedy Bucker, MD      . Melene Muller ON 05/13/2017] amLODipine (NORVASC) tablet 5 mg  5 mg Oral Daily Kennedy Bucker, MD      . bisacodyl (DULCOLAX) suppository 10 mg  10 mg Rectal Daily PRN Kennedy Bucker, MD      .  carvedilol (COREG) tablet 6.25 mg  6.25 mg Oral BID WC Kennedy BuckerMenz, Michael, MD      . cholecalciferol (VITAMIN D) tablet 1,000 Units  1,000 Units Oral QPM Kennedy BuckerMenz, Michael, MD      . clindamycin (CLEOCIN) IVPB 900 mg  900 mg Intravenous Q6H Kennedy BuckerMenz, Michael, MD      . cyclobenzaprine (FLEXERIL) tablet 5 mg  5 mg Oral TID PRN Kennedy BuckerMenz, Michael, MD      . diphenhydrAMINE (BENADRYL) 12.5 MG/5ML elixir 12.5-25 mg  12.5-25 mg Oral Q4H PRN Kennedy BuckerMenz, Michael, MD      . diphenoxylate-atropine (LOMOTIL) 2.5-0.025 MG per tablet 2 tablet  2 tablet Oral QID PRN Kennedy BuckerMenz, Michael, MD      . docusate sodium (COLACE) capsule 100 mg  100 mg Oral BID Kennedy BuckerMenz, Michael, MD   100 mg at 05/12/17 1310  . [START ON 05/13/2017] enoxaparin (LOVENOX)  injection 40 mg  40 mg Subcutaneous Q24H Kennedy BuckerMenz, Michael, MD      . ferrous sulfate tablet 325 mg  325 mg Oral QPM Kennedy BuckerMenz, Michael, MD      . lipase/protease/amylase (CREON) capsule 36,000 Units  36,000 Units Oral TID WC Kennedy BuckerMenz, Michael, MD      . magnesium citrate solution 1 Bottle  1 Bottle Oral Once PRN Kennedy BuckerMenz, Michael, MD      . magnesium hydroxide (MILK OF MAGNESIA) suspension 30 mL  30 mL Oral Daily PRN Kennedy BuckerMenz, Michael, MD      . menthol-cetylpyridinium (CEPACOL) lozenge 3 mg  1 lozenge Oral PRN Kennedy BuckerMenz, Michael, MD       Or  . phenol (CHLORASEPTIC) mouth spray 1 spray  1 spray Mouth/Throat PRN Kennedy BuckerMenz, Michael, MD      . methocarbamol (ROBAXIN) tablet 500 mg  500 mg Oral Q6H PRN Kennedy BuckerMenz, Michael, MD   500 mg at 05/12/17 1339   Or  . methocarbamol (ROBAXIN) 500 mg in dextrose 5 % 50 mL IVPB  500 mg Intravenous Q6H PRN Kennedy BuckerMenz, Michael, MD      . metoCLOPramide (REGLAN) tablet 5-10 mg  5-10 mg Oral Q8H PRN Kennedy BuckerMenz, Michael, MD       Or  . metoCLOPramide (REGLAN) injection 5-10 mg  5-10 mg Intravenous Q8H PRN Kennedy BuckerMenz, Michael, MD      . morphine 2 MG/ML injection 2 mg  2 mg Intravenous Q1H PRN Kennedy BuckerMenz, Michael, MD      . ondansetron [facility](ZOFRAN) tablet 4 mg  4 mg Oral Q6H PRN Kennedy BuckerMenz, Michael, MD       Or  . ondansetron [facility](ZOFRAN) injection 4 mg  4 mg Intravenous Q6H PRN Kennedy BuckerMenz, Michael, MD      . oxyCODONE (Oxy IR/ROXICODONE) immediate release tablet 5-10 mg  5-10 mg Oral Q3H PRN Kennedy BuckerMenz, Michael, MD   5 mg at 05/12/17 1310  . [START ON 05/13/2017] pantoprazole (PROTONIX) EC tablet 40 mg  40 mg Oral Daily Kennedy BuckerMenz, Michael, MD      . vitamin C (ASCORBIC ACID) tablet 1,000 mg  1,000 mg Oral QPM Kennedy BuckerMenz, Michael, MD      . zolpidem (AMBIEN) tablet 5 mg  5 mg Oral QHS PRN Kennedy BuckerMenz, Michael, MD         Discharge Medications: Please see discharge summary for a list of discharge medications.  Relevant Imaging Results:  Relevant Lab Results:   Additional Information  (SSN: [id number])  [place], Darleen CrockerBailey M, LCSW

## 2017-05-12 NOTE — Transfer of Care (Signed)
Immediate Anesthesia Transfer of Care Note  [name]  Procedure(s) Performed: Procedure(s): TOTAL HIP ARTHROPLASTY ANTERIOR APPROACH (Left)  Patient Location: PACU  Anesthesia Type:Spinal  Level of Consciousness: awake and alert   Airway & Oxygen Therapy: Patient Spontanous Breathing and Patient connected to nasal cannula oxygen  Post-op Assessment: Report given to RN and Post -op Vital signs reviewed and stable  Post vital signs: Reviewed  Last Vitals:  Vitals:   05/12/17 0839 05/12/17 1120  BP: (!) 159/87 (!) 88/56  Pulse: 84 64  Resp: 16 16  Temp: 37 C 36.7 C    Last Pain:  Vitals:   05/12/17 0839  TempSrc: Oral  PainSc: 7          Complications: No apparent anesthesia complications

## 2017-05-12 NOTE — OR Nursing (Signed)
  Lab tech in for abo/rh draw @ [phone]

## 2017-05-12 NOTE — Progress Notes (Signed)
15 minute call to floor. 

## 2017-05-12 NOTE — Op Note (Signed)
05/12/2017  11:19 AM  PATIENT:  Amanda [name]  76 y.o. female  PRE-OPERATIVE DIAGNOSIS:  PRIMARY OSTEOARTHRITIS OF LEFT HIP  POST-OPERATIVE DIAGNOSIS:  PRIMARY OSTEOARTHRITIS OF LEFT HIP  PROCEDURE:  Procedure(s): TOTAL HIP ARTHROPLASTY ANTERIOR APPROACH (Left)  SURGEON: Leitha SchullerMichael J Waldine Zenz, MD  ASSISTANTS: None  ANESTHESIA:   spinal  EBL:  Total I/O In: 800 [I.V.:800] Out: 450 [Urine:250; Blood:200]  BLOOD ADMINISTERED:none  DRAINS: none   LOCAL MEDICATIONS USED:  MARCAINE     SPECIMEN:  Source of Specimen:  Left femoral head  DISPOSITION OF SPECIMEN:  PATHOLOGY  COUNTS:  YES  TOURNIQUET:  * No tourniquets in log *  IMPLANTS: Medacta Amis for standard collared stem with 50 mm Mpact DM cup with liner and M 28 mm metal head  DICTATION: .Dragon Dictation   The patient was brought to the operating room and after spinal anesthesia was obtained patient was placed on the operative table with the ipsilateral foot into the Medacta attachment, contralateral leg on a well-padded table. C-arm was brought in and preop template x-ray taken. After prepping and draping in usual sterile fashion appropriate patient identification and timeout procedures were completed. Anterior approach to the hip was obtained and centered over the greater trochanter and TFL muscle. The subcutaneous tissue was incised hemostasis being achieved by electrocautery. TFL fascia was incised and the muscle retracted laterally deep retractor placed. The lateral femoral circumflex vessels were identified and ligated. The anterior capsule was exposed and a capsulotomy performed. The neck was identified and a femoral neck cut carried out with a saw. The head was removed without difficulty and showed sclerotic femoral head and acetabulum. Reaming was carried out to 50 mm and a 50 mm cup trial gave appropriate tightness to the acetabular component a 50 DM cup was impacted into position. The leg was then externally rotated and  ischiofemoral and pubofemoral releases carried out. The femur was sequentially broached to a size 4, size 4 standard width M head trials were placed and the final components chosen. The 4 standard stem was inserted along with a M metal 28 mm head and 50 mm liner. The hip was reduced and was stable the wound was thoroughly irrigated. Fibrillar was placed along the posterior capsule to help in postop hemostasis The deep fascia was closed using a heavy Quill after infiltration of 30 cc of quarter percent Sensorcaine with epinephrine. Subcuticular closure with 3-0 v-loc followed by skin staples and Provena incisional wound VAC  PLAN OF CARE: Admit to inpatient

## 2017-05-12 NOTE — Anesthesia Preprocedure Evaluation (Signed)
Anesthesia Evaluation  Patient identified by MRN, date of birth, ID band Patient awake    Reviewed: Allergy & Precautions, NPO status , Patient's Chart, lab work & pertinent test results, reviewed documented beta blocker date and time   Airway Mallampati: III  TM Distance: >3 FB     Dental  (+) Chipped   Pulmonary asthma , former smoker,           Cardiovascular hypertension, Pt. on medications and Pt. on home beta blockers      Neuro/Psych Anxiety  Neuromuscular disease    GI/Hepatic hiatal hernia,   Endo/Other    Renal/GU      Musculoskeletal   Abdominal   Peds  Hematology  (+) anemia ,   Anesthesia Other Findings   Reproductive/Obstetrics                             Anesthesia Physical Anesthesia Plan  ASA: III  Anesthesia Plan: Spinal   Post-op Pain Management:    Induction:   PONV Risk Score and Plan:   Airway Management Planned:   Additional Equipment:   Intra-op Plan:   Post-operative Plan:   Informed Consent: I have reviewed the patients History and Physical, chart, labs and discussed the procedure including the risks, benefits and alternatives for the proposed anesthesia with the patient or authorized representative who has indicated his/her understanding and acceptance.     Plan Discussed with: CRNA  Anesthesia Plan Comments:         Anesthesia Quick Evaluation

## 2017-05-12 NOTE — Progress Notes (Signed)
Patient able to slightly move hips side to side.

## 2017-05-12 NOTE — OR Nursing (Signed)
To OR w/thermal cap in place.  TXA, Cleocin, and sacral drsg sent to OR with patient

## 2017-05-13 LAB — CBC
HCT: 34.7 % — ABNORMAL LOW (ref 35.0–47.0)
HEMOGLOBIN: 11.7 g/dL — AB (ref 12.0–16.0)
MCH: 29 pg (ref 26.0–34.0)
MCHC: 33.8 g/dL (ref 32.0–36.0)
MCV: 85.7 fL (ref 80.0–100.0)
Platelets: 164 10*3/uL (ref 150–440)
RBC: 4.04 MIL/uL (ref 3.80–5.20)
RDW: 13.8 % (ref 11.5–14.5)
WBC: 7.5 10*3/uL (ref 3.6–11.0)

## 2017-05-13 LAB — BASIC METABOLIC PANEL
Anion gap: 3 — ABNORMAL LOW (ref 5–15)
BUN: 12 mg/dL (ref 6–20)
CHLORIDE: 106 mmol/L (ref 101–111)
CO2: 30 mmol/L (ref 22–32)
CREATININE: 0.77 mg/dL (ref 0.44–1.00)
Calcium: 8.7 mg/dL — ABNORMAL LOW (ref 8.9–10.3)
GFR calc Af Amer: >60 mL/min (ref >60)
GFR calc non Af Amer: >60 mL/min (ref >60)
GLUCOSE: 110 mg/dL — AB (ref 65–99)
POTASSIUM: 4.2 mmol/L (ref 3.5–5.1)
SODIUM: 139 mmol/L (ref 135–145)

## 2017-05-13 NOTE — Progress Notes (Signed)
   Subjective: 1 Day Post-Op Procedure(s) (LRB): TOTAL HIP ARTHROPLASTY ANTERIOR APPROACH (Left) Patient reports pain as moderate.  However according to nurses notes patient slept most the night. Prior to that she was a level IV. Patient is well, and has had no acute complaints or problems We will start therapy today.  Plan is to go Home after hospital stay. no nausea and no vomiting Patient denies any chest pains or shortness of breath. Objective: Vital signs in last 24 hours: Temp:  [97.6 F (36.4 C)-98.7 F (37.1 C)] 98.7 F (37.1 C) (07/04 0408) Pulse Rate:  [59-84] 71 (07/04 0513) Resp:  [12-18] 17 (07/04 0408) BP: (88-159)/(54-87) 111/59 (07/04 0408) SpO2:  [85 %-100 %] 97 % (07/04 0513) Weight:  [86.2 kg (190 lb)] 86.2 kg (190 lb) (07/03 0839) Wound VAC in place. Some redness to the lateral distal incision which appear to be more soft tissue bruising then any infection was noted. Heels are non tender and elevated off the bed using rolled towels Intake/Output from previous day: 07/03 0701 - 07/04 0700 In: 2017.5 [I.V.:1917.5; IV Piggyback:100] Out: 1650 [Urine:1450; Blood:200] Intake/Output this shift: Total I/O In: 842.5 [I.V.:742.5; IV Piggyback:100] Out: 1200 [Urine:1200]   Recent Labs  05/12/17 1314 05/13/17 0327  HGB 13.5 11.7*    Recent Labs  05/12/17 1314 05/13/17 0327  WBC 10.5 7.5  RBC 4.71 4.04  HCT 40.0 34.7*  PLT 200 164    Recent Labs  05/12/17 1314 05/13/17 0327  NA  --  139  K  --  4.2  CL  --  106  CO2  --  30  BUN  --  12  CREATININE 0.91 0.77  GLUCOSE  --  110*  CALCIUM  --  8.7*   No results for input(s): LABPT, INR in the last 72 hours.  EXAM General - Patient is Alert, Appropriate and Oriented Extremity - Neurologically intact Neurovascular intact Sensation intact distally Intact pulses distally Dorsiflexion/Plantar flexion intact No cellulitis present Compartment soft Dressing - Wound VAC in place Motor Function -  intact, moving foot and toes well on exam.    Past Medical History:  Diagnosis Date  . Anxiety   . Asthma   . Complication of anesthesia    difficulty waking up from anesthesia  . GI bleed   . Hiatal hernia   . Hypertension   . IDA (iron deficiency anemia)     Assessment/Plan: 1 Day Post-Op Procedure(s) (LRB): TOTAL HIP ARTHROPLASTY ANTERIOR APPROACH (Left) Active Problems:   Primary localized osteoarthrosis of left hip  Estimated body mass index is 37.11 kg/m as calculated from the following:   Height as of this encounter: 5' (1.524 m).   Weight as of this encounter: 86.2 kg (190 lb). Advance diet Up with therapy D/C IV fluids Plan for discharge tomorrow Discharge home with home health  Labs: Were reviewed and acceptable DVT Prophylaxis - Lovenox, Foot Pumps and TED hose Weight-Bearing as tolerated to left leg D/C O2 and Pulse OX and try on Room Air Begin working on bowel movement  Willie [place] R. [facility] PA [facility] 05/13/2017, 6:25 AM

## 2017-05-13 NOTE — Progress Notes (Signed)
Patient A/O, POD 1, pain controlled with prescribed medications, IVF infusing as ordered, wound vac in place with no drainage noted. During morning administration & routine VS, patient's O2 sats noted in the low to mid 80's on RA. 2L O2 applied, via [place] and stimulation provided to easily awaken patient. Saturations improved. RN educated patient about the importance of coughing/ deep breathing and pain medications decreasing O2 levels. Foley catheter removed @ 0515. Will continue to monitor and educate patient as needed.

## 2017-05-13 NOTE — Progress Notes (Signed)
Physical Therapy Treatment Patient Details Name: Amanda SilvanKatie [name] MRN: [id number] DOB: 01/06/1941 Today's Date: 05/13/2017    History of Present Illness 76 y/o s/p a L THA (anterior approach) performed on 05/12/17. PMH includes asthma, HTN, anxiety, hiatal hernia, and anemia.    PT Comments    Difficulty treating pt today.  Attempted in AM and pt awaiting breathing treatment.  Returned in PM where pt was able to participate in minimal exercises as described below.  Overall poor tolerance and crying/yelling out in pain.  Pain meds were given by nurse during session.  Returned after 30 minutes and pt continues to be in pain and unable to participate in mobility skills.  Discussed with primary nurse who stated pt has had pain medication regularly today.  Stated she has been up to chair and pivoted well to chair.  Encouraged pt to get to recliner for dinner once pain is controlled and relayed to nursing.    Pt making limited progress in mobility skills due to pain.  D/C to home is planned with HHPT.  Will continue with current goals and assess discharge plan tomorrow if it is still appropriate due to lack of mobility.   Follow Up Recommendations  Home health PT     Equipment Recommendations  3in1 (PT)    Recommendations for Other Services       Precautions / Restrictions Precautions Precautions: Fall Restrictions Weight Bearing Restrictions: Yes LLE Weight Bearing: Weight bearing as tolerated    Mobility  Bed Mobility                  Transfers                    Ambulation/Gait                 Stairs            Wheelchair Mobility    Modified Rankin (Stroke Patients Only)       Balance                                            Cognition Arousal/Alertness: Awake/alert Behavior During Therapy: WFL for tasks assessed/performed Overall Cognitive Status: Within Functional Limits for tasks assessed                                        Exercises Other Exercises Other Exercises: Supine ther-ex x10 on RLE includes: ankle pumps B, quad sets, glut squeeze, pillow squeeze SAQ, SLR's, and hip abduction AAROM    General Comments        Pertinent Vitals/Pain Pain Score: 8  Pain Location: L hip Pain Descriptors / Indicators: Operative site guarding;Moaning;Guarding;Grimacing Pain Intervention(s): Premedicated before session;Monitored during session;Limited activity within patient's tolerance    Home Living                      Prior Function            PT Goals (current goals can now be found in the care plan section) Progress towards PT goals: Not progressing toward goals - comment    Frequency    BID      PT Plan Current plan remains appropriate    Co-evaluation  AM-PAC PT "6 Clicks" Daily Activity  Outcome Measure  Difficulty turning over in bed (including adjusting bedclothes, sheets and blankets)?: A Little Difficulty moving from lying on back to sitting on the side of the bed? : Total Difficulty sitting down on and standing up from a chair with arms (e.g., wheelchair, bedside commode, etc,.)?: Total Help needed moving to and from a bed to chair (including a wheelchair)?: A Little Help needed walking in hospital room?: A Lot Help needed climbing 3-5 steps with a railing? : Total 6 Click Score: 11    End of Session Equipment Utilized During Treatment: Gait belt Activity Tolerance: Patient limited by pain Patient left: with bed alarm set;in bed;with call bell/phone within reach;with family/visitor present Nurse Communication: Mobility status;Other (comment);Patient requests pain meds Pain - Right/Left: Left Pain - part of body: Hip     Time: 1610-9604 PT Time Calculation (min) (ACUTE ONLY): 11 min  Charges:  $Therapeutic Exercise: 8-22 mins                    G Codes:       Danielle Dess, PTA 05/13/17, 4:00 PM

## 2017-05-13 NOTE — Progress Notes (Signed)
[place] rounding unit visit with pt. [place] met pt and pt's husband who was sitting at the bedside. Pt was calm and talked briefly about his health struggles and her husband health concerns. Pt's husband talked about his own issues and informed [place] he will have a knee surgery as soon as his wife gets better. CH offered prayers for pt and her husband.    05/13/17 1400  Clinical Encounter Type  Visited With Patient and family together  Visit Type Initial;Spiritual support  Referral From Patient  Spiritual Encounters  Spiritual Needs Prayer;Emotional

## 2017-05-13 NOTE — Anesthesia Postprocedure Evaluation (Signed)
Anesthesia Post Note  Patient: Amanda SilvanKatie [name]  Procedure(s) Performed: Procedure(s) (LRB): TOTAL HIP ARTHROPLASTY ANTERIOR APPROACH (Left)  Patient location during evaluation: Nursing Unit Anesthesia Type: Spinal Level of consciousness: awake, awake and alert and oriented Pain management: pain level controlled Respiratory status: spontaneous breathing Cardiovascular status: blood pressure returned to baseline and stable Postop Assessment: no headache, no backache, adequate PO intake, no signs of nausea or vomiting and patient able to bend at knees Anesthetic complications: no     Last Vitals:  Vitals:   05/13/17 0408 05/13/17 0513  BP: (!) 111/59   Pulse: 77 71  Resp: 17   Temp: 37.1 C     Last Pain:  Vitals:   05/13/17 0408  TempSrc: Oral  PainSc:                  Lyn RecordsNoles,  Coleby Yett R

## 2017-05-14 NOTE — Progress Notes (Signed)
SNF and Non-Emergent EMS Transport Benefits:  Number called: [phone] Rep: Brie Reference Number: [id number]  OQHU [id number] Complete HMO Plan Two active as of 02/08/17 with no deductible.  Out of pocket max is $6700, of which $425.63 met so far.  In-network SNF: $0 copay for days 1-20, a $160 daily copay for days 21-62, and a $0 copay for days 63-100.  Once out of pocket is reached, patient covered at 100% for remainder of 100 day benefit period.  Per rep, all 100 skilled days available at time of call. $0 copay for professional fees and 3 day hospital stay is not required.  Josem Kaufmann is required: [phone].    Non-emergent EMS transport: $250 copay for each one way medically necessary, Medicare covered trip.  Josem Kaufmann is not required.

## 2017-05-14 NOTE — Clinical Social Work Note (Signed)
Clinical Social Work Assessment  Patient Details  Name: Amanda [name] MRN: [id number] Date of Birth: Jun 11, 1941  Date of referral:  05/14/17               Reason for consult:  Facility Placement                Permission sought to share information with:  Chartered certified accountant granted to share information::  Yes, Verbal Permission Granted  Name::      [place]::   [facility]   Relationship::     Contact Information:     Housing/Transportation Living arrangements for the past 2 months:  [place] of Information:  Patient, Spouse Patient Interpreter Needed:  None Criminal Activity/Legal Involvement Pertinent to Current Situation/Hospitalization:  No - Comment as needed Significant Relationships:  Spouse Lives with:  Adult Children Do you feel safe going back to the place where you live?  Yes Need for family participation in patient care:  Yes (Comment)  Care giving concerns:  Patient lives in [place], [place] with her husband Amanda [name].    Social Worker assessment / plan:  Holiday representative (CSW) received SNF consult. PT recommended home health on post op day 1 and changed recommendation to SNF on post op day 2. CSW met with patient and her husband Amanda [name] was at bedside. CSW introduced self and explained role of CSW department. Patient was alert and oriented X4. Patient reported that she lives in [place] with her husband. CSW explained SNF process and that per [facility] benefits patient will have "$0 copay for days 1-20, a $160 daily copay for days 21-62, and a $0 copay for days 63-100" at SNF. Patient and husband are agreeable to SNF search and prefer [facility]. Patient reported that if she improves with PT on post op day 3 she would like to go home with home health. FL2 complete and faxed out.   CSW presented bed offers and patient chose [facility]. CSW [facility] at  [facility]. Plan is for patient to D/C to [facility] tomorrow. Amanda [name] is aware of above. CSW will continue to follow and assist as needed.     Employment status:  Retired Nurse, adult PT Recommendations:  [place] / Referral to community resources:  [place]  Patient/Family's Response to care:  Patient and husband are agreeable to [facility] for short term rehab.   Patient/Family's Understanding of and Emotional Response to Diagnosis, Current Treatment, and Prognosis:  Patient and husband were very pleasant and thanked CSW for assistance.   Emotional Assessment Appearance:  Appears stated age Attitude/Demeanor/Rapport:    Affect (typically observed):  Accepting, Adaptable, Pleasant Orientation:  Oriented to Self, Oriented to Place, Oriented to  Time, Oriented to Situation Alcohol / Substance use:  Not Applicable Psych involvement (Current and /or in the community):  No (Comment)  Discharge Needs  Concerns to be addressed:  Discharge Planning Concerns Readmission within the last 30 days:  No Current discharge risk:  Dependent with Mobility Barriers to Discharge:  Continued Medical Work up   [employer], Amanda Beets, LCSW 05/14/2017, 2:21 PM

## 2017-05-14 NOTE — Progress Notes (Signed)
Physical Therapy Treatment Patient Details Name: Amanda SilvanKatie Lenn MRN: [id number] DOB: 03/30/1941 Today's Date: 05/14/2017    History of Present Illness 76 y/o s/p a L THA (anterior approach) performed on 05/12/17. PMH includes asthma, HTN, anxiety, hiatal hernia, and anemia.    PT Comments    Pt agreeable to PT; continues to report 8/10 pain with ambulation/weight bearing. O2 saturation checked on room air and found to be 85; re applied O2 with improvement to 93%, Nurse notified. Pt requires Min A for stand transfers. Requires heavy instruction with poor compliance to sequencing for ambulation, but does progress to 12 ft of ambulation and up to the chair. Pt participates in long sit exercises with assist as needed. Recommendation changed to skilled nursing facility post discharge this session. SW aware and already working on placement. Continue PT to progress strength, endurance and balance to improve all functional mobility.    Follow Up Recommendations  SNF     Equipment Recommendations       Recommendations for Other Services       Precautions / Restrictions Precautions Precautions: Fall Restrictions Weight Bearing Restrictions: Yes LLE Weight Bearing: Weight bearing as tolerated    Mobility  Bed Mobility Overal bed mobility: Needs Assistance Bed Mobility: Supine to Sit     Supine to sit: Mod assist     General bed mobility comments: Not tested; pt sitting edge of bed  Transfers Overall transfer level: Needs assistance Equipment used: Rolling walker (2 wheeled) Transfers: Sit to/from Stand Sit to Stand: Min assist         General transfer comment: cues for hand placement; Min A for steadiness, as decreased use of LLE  Ambulation/Gait Ambulation/Gait assistance: Min guard Ambulation Distance (Feet): 12 Feet Assistive device: Rolling walker (2 wheeled) Gait Pattern/deviations: Step-to pattern;Decreased stance time - left;Trunk flexed;Wide base of  support;Antalgic;Decreased weight shift to left;Decreased step length - right (poor sequencing) Gait velocity: slow Gait velocity interpretation: <1.8 ft/sec, indicative of risk for recurrent falls General Gait Details: Poor seqeuncing despite cues/instruction. Pt impulsive/anxious throuhgout    Information systems managertairs            Wheelchair Mobility    Modified Rankin (Stroke Patients Only)       Balance Overall balance assessment: Needs assistance Sitting-balance support: Feet supported;Bilateral upper extremity supported Sitting balance-Leahy Scale: Fair (Fair+)     Standing balance support: Bilateral upper extremity supported Standing balance-Leahy Scale: Fair                              Cognition Arousal/Alertness: Awake/alert Behavior During Therapy: WFL for tasks assessed/performed;Impulsive Overall Cognitive Status: Within Functional Limits for tasks assessed                                 General Comments: anxious, nervous, impulsive with difficulty following directions due to aforementioned      Exercises Total Joint Exercises Ankle Circles/Pumps: AROM;Both;20 reps Quad Sets: Strengthening;Both;20 reps Gluteal Sets: Strengthening;Both;20 reps Heel Slides: AAROM;10 reps;Left (2 sets) Hip ABduction/ADduction: AAROM;Left;10 reps (2 sets) Other Exercises Other Exercises: toileting with need for Min A to steady for personal hygiene in stand    General Comments        Pertinent Vitals/Pain Pain Assessment: 0-10 Pain Score: 8  (With ambulation; much less at rest) Pain Location: L hip Pain Descriptors / Indicators: Grimacing;Operative site guarding;Moaning;Other (Comment) (yelling out) Pain Intervention(s):  Monitored during session;Repositioned    Home Living                      Prior Function            PT Goals (current goals can now be found in the care plan section) Progress towards PT goals: Progressing toward goals     Frequency    BID      PT Plan Discharge plan needs to be updated    Co-evaluation              AM-PAC PT "6 Clicks" Daily Activity  Outcome Measure  Difficulty turning over in bed (including adjusting bedclothes, sheets and blankets)?: Total Difficulty moving from lying on back to sitting on the side of the bed? : Total Difficulty sitting down on and standing up from a chair with arms (e.g., wheelchair, bedside commode, etc,.)?: Total Help needed moving to and from a bed to chair (including a wheelchair)?: A Little Help needed walking in hospital room?: A Lot Help needed climbing 3-5 steps with a railing? : Total 6 Click Score: 9    End of Session Equipment Utilized During Treatment: Gait belt Activity Tolerance: Patient limited by pain Patient left: in chair;with call bell/phone within reach;with chair alarm set;with family/visitor present Nurse Communication:  (O2 saturation ) PT Visit Diagnosis: Other abnormalities of gait and mobility (R26.89);History of falling (Z91.81);Muscle weakness (generalized) (M62.81);Pain Pain - Right/Left: Left Pain - part of body: Hip     Time: 1126-1202 PT Time Calculation (min) (ACUTE ONLY): 36 min  Charges:  $Gait Training: 8-22 mins $Therapeutic Exercise: 8-22 mins $Therapeutic Activity: 8-22 mins                    G Codes:          Scot Dock, PTA 05/14/2017, 12:12 PM

## 2017-05-14 NOTE — Progress Notes (Signed)
   Subjective: 2 Days Post-Op Procedure(s) (LRB): TOTAL HIP ARTHROPLASTY ANTERIOR APPROACH (Left) Patient reports pain as mild.   Patient is well, and has had no acute complaints or problems Denies any CP, SOB, ABD pain. We will continue therapy today.    Objective: Vital signs in last 24 hours: Temp:  [98.2 F (36.8 C)-99.2 F (37.3 C)] 98.6 F (37 C) (07/04 2304) Pulse Rate:  [78-89] 88 (07/05 0607) Resp:  [18] 18 (07/04 1340) BP: (109-118)/(52-62) 118/55 (07/04 2304) SpO2:  [92 %-96 %] 93 % (07/05 0607)  Intake/Output from previous day: 07/04 0701 - 07/05 0700 In: 0  Out: 1500 [Urine:1500] Intake/Output this shift: No intake/output data recorded.   Recent Labs  05/12/17 1314 05/13/17 0327  HGB 13.5 11.7*    Recent Labs  05/12/17 1314 05/13/17 0327  WBC 10.5 7.5  RBC 4.71 4.04  HCT 40.0 34.7*  PLT 200 164    Recent Labs  05/12/17 1314 05/13/17 0327  NA  --  139  K  --  4.2  CL  --  106  CO2  --  30  BUN  --  12  CREATININE 0.91 0.77  GLUCOSE  --  110*  CALCIUM  --  8.7*   No results for input(s): LABPT, INR in the last 72 hours.  EXAM General - Patient is Alert, Appropriate and Oriented Extremity - Sensation intact distally Intact pulses distally Dorsiflexion/Plantar flexion intact No cellulitis present Compartment soft Dressing - dressing C/D/I, no drainage and wound vac intact Motor Function - intact, moving foot and toes well on exam.   Past Medical History:  Diagnosis Date  . Anxiety   . Asthma   . Complication of anesthesia    difficulty waking up from anesthesia  . GI bleed   . Hiatal hernia   . Hypertension   . IDA (iron deficiency anemia)     Assessment/Plan:   2 Days Post-Op Procedure(s) (LRB): TOTAL HIP ARTHROPLASTY ANTERIOR APPROACH (Left) Active Problems:   Primary localized osteoarthrosis of left hip  Estimated body mass index is 37.11 kg/m as calculated from the following:   Height as of this encounter: 5'  (1.524 m).   Weight as of this encounter: 86.2 kg (190 lb). Advance diet Up with therapy  Needs BM Patient doing well. VSS. Plan to discharge home tomorrow with HHPT CM to assist with discharge to home with HHPT    DVT Prophylaxis - Lovenox, Foot Pumps and TED hose Weight-Bearing as tolerated to left leg   T. [place] Neighborhris Gaines, PA-C [facility] 05/14/2017, 7:03 AM

## 2017-05-14 NOTE — Clinical Social Work Placement (Addendum)
   CLINICAL SOCIAL WORK PLACEMENT  NOTE  Date:  05/14/2017  Patient Details  Name: Amanda SilvanKatie [name] MRN: [id number] Date of Birth: 11/02/1941  Clinical Social Work is seeking post-discharge placement for this patient at the Skilled  Nursing Facility level of care (*CSW will initial, date and re-position this form in  chart as items are completed):  Yes   Patient/family provided with [place] Clinical Social Work Department's list of facilities offering this level of care within the geographic area requested by the patient (or if unable, by the patient's family).  Yes   Patient/family informed of their freedom to choose among providers that offer the needed level of care, that participate in Medicare, Medicaid or managed care program needed by the patient, have an available bed and are willing to accept the patient.  Yes   Patient/family informed of [place]'s ownership interest in [facility] and [facility], as well as of the fact that they are under no obligation to receive care at these facilities.  PASRR submitted to EDS on       PASRR number received on       Existing PASRR number confirmed on 05/14/17     FL2 transmitted to all facilities in geographic area requested by pt/family on 05/14/17     FL2 transmitted to all facilities within larger geographic area on       Patient informed that his/her managed care company has contracts with or will negotiate with certain facilities, including the following:        Yes   Patient/family informed of bed offers received.  Patient chooses bed at  [facility] )     Physician recommends and patient chooses bed at      Patient to be transferred to  [facility] on  05-15-17  (Updated Windell MouldingEric Othon Guardia, MSW, LCSWA, 05-15-17).  Patient to be [facility] facility [facility]  [facility]    (Updated Windell MouldingEric Latrecia Capito, MSW, [place], 05-15-17)  Patient family notified on   05-15-17 of transfer. (Updated Windell MouldingEric Lilit Cinelli,  MSW, Theresia MajorsLCSWA, 05-15-17)  Name of family member notified:    Rande LawmanJohn Sramek patient's husband at bedside.  (Updated Windell MouldingEric Ema Hebner, MSW, [place]CS[place], 05-15-17)    PHYSICIAN       Additional Comment:    _______________________________________________ Sample, Darleen CrockerBailey M, LCSW 05/14/2017, 2:20 PM   Ervin KnackEric R. Hassan Rowannterhaus, MSW, Theresia MajorsLCSWA [phone]  05/15/2017 1:15 PM  (Updated Windell MouldingEric Demeco Ducksworth, MSW, LCSWA, 05-15-17)

## 2017-05-14 NOTE — Progress Notes (Signed)
Physical Therapy Treatment Patient Details Name: Amanda [name] MRN: [id number] DOB: [birth year] Today's Date: 05/14/2017    History of Present Illness 76 y/o s/p a L THA (anterior approach) performed on 05/12/17. PMH includes asthma, HTN, anxiety, hiatal hernia, and anemia.    PT Comments    Pt sleeping; awoken through voice. Agreeable to PT. Reports 8/10 pain in left hip with movement. Pt requiring Mod A for bed mobility and min A for transfers from bed and BSC with min A for stability during toileting activities. Pt anxious with moaning and crying out with pain during all mobility activities. Pt educated on improved steps during ambulation to improve weight bearing tolerance on the left. O2 saturation on room air 96-97%; nursing notified and will recheck as pt has just had pain medication and has been known to desaturate after pain medication according to nurse. Plan to return later this morning for second session with hopeful gains in mobility; pt must also demonstrate stair climbing in order to reasonably recommend home health PT  Follow Up Recommendations  Other (comment) (TBD, possible SNF due to poor mobility)     Equipment Recommendations       Recommendations for Other Services       Precautions / Restrictions Precautions Precautions: Fall Restrictions Weight Bearing Restrictions: Yes LLE Weight Bearing: Weight bearing as tolerated    Mobility  Bed Mobility Overal bed mobility: Needs Assistance Bed Mobility: Supine to Sit     Supine to sit: Mod assist     General bed mobility comments: Assist for LEs and heavy assist for trunk  Transfers Overall transfer level: Needs assistance Equipment used: Rolling walker (2 wheeled) Transfers: Sit to/from Stand Sit to Stand: Min assist         General transfer comment: cues for hands, use of LLE. Performs from bed and BSC  Ambulation/Gait Ambulation/Gait assistance: Min guard Ambulation Distance (Feet): 3 Feet (performed  twice) Assistive device: Rolling walker (2 wheeled) Gait Pattern/deviations: Step-to pattern;Decreased stance time - left;Trunk flexed;Wide base of support;Antalgic;Decreased weight shift to left;Decreased step length - right Gait velocity: slow Gait velocity interpretation: <1.8 ft/sec, indicative of risk for recurrent falls General Gait Details: cues for improved step lengths (decreased L and increase R for improved tolerance of wb'g L)    Stairs            Wheelchair Mobility    Modified Rankin (Stroke Patients Only)       Balance                                            Cognition Arousal/Alertness: Awake/alert Behavior During Therapy: WFL for tasks assessed/performed Overall Cognitive Status: Within Functional Limits for tasks assessed                                 General Comments: anxious, nervous, impulsive with difficulty following directions due to aforementioned      Exercises Other Exercises Other Exercises: toileting with need for Min A to steady for personal hygiene in stand    General Comments        Pertinent Vitals/Pain Pain Assessment: 0-10 Pain Score: 8  Pain Location: L hip Pain Descriptors / Indicators: Grimacing;Operative site guarding;Moaning;Other (Comment) (yelling out)    Home Living  Prior Function            PT Goals (current goals can now be found in the care plan section) Progress towards PT goals: Progressing toward goals (slowly)    Frequency    BID      PT Plan Other (comment) (to be assessed next session later this morning)    Co-evaluation              AM-PAC PT "6 Clicks" Daily Activity  Outcome Measure  Difficulty turning over in bed (including adjusting bedclothes, sheets and blankets)?: Total Difficulty moving from lying on back to sitting on the side of the bed? : Total Difficulty sitting down on and standing up from a chair with arms  (e.g., wheelchair, bedside commode, etc,.)?: Total Help needed moving to and from a bed to chair (including a wheelchair)?: A Little Help needed walking in hospital room?: A Lot Help needed climbing 3-5 steps with a railing? : Total 6 Click Score: 9    End of Session Equipment Utilized During Treatment: Gait belt Activity Tolerance: Patient limited by pain Patient left: Other (comment) (sitting edge of bed with nursing for medication and breakfas) Nurse Communication: Other (comment);Mobility status (O2 saturation ) PT Visit Diagnosis: Other abnormalities of gait and mobility (R26.89);History of falling (Z91.81);Muscle weakness (generalized) (M62.81);Pain Pain - Right/Left: Left Pain - part of body: Hip     Time: [id number] PT Time Calculation (min) (ACUTE ONLY): 27 min  Charges:  $Gait Training: 8-22 mins $Therapeutic Activity: 8-22 mins                    G Codes:        Scot DockHeidi E Barnes, PTA 05/14/2017, 10:50 AM

## 2017-05-14 NOTE — Progress Notes (Signed)
Clinical Social Worker (CSW) received SNF consult. PT is recommending home health. RN case manager is aware of above. Please reconsult if future social work needs arise. CSW signing off.   Neysa Arts, LCSW [phone] 

## 2017-05-14 NOTE — Care Management (Signed)
RNCM consult received for discharge planning. PT recommending SNF. Will sign off. Case Closed. Please reconsult if further needs arise.

## 2017-05-15 LAB — SURGICAL PATHOLOGY

## 2017-05-15 MED ORDER — OXYCODONE HCL 5 MG PO TABS: 5.0000 mg | ORAL_TABLET | ORAL | 0 refills | Status: DC | PRN

## 2017-05-15 MED ORDER — OXYCODONE HCL ER 15 MG PO T12A: 15.0000 mg | EXTENDED_RELEASE_TABLET | Freq: Two times a day (BID) | ORAL | 0 refills | Status: DC

## 2017-05-15 MED ORDER — ENOXAPARIN SODIUM 40 MG/0.4ML ~~LOC~~ SOLN: 40.0000 mg | SUBCUTANEOUS | 0 refills | Status: DC

## 2017-05-15 NOTE — Discharge Planning (Signed)
Patient IV removed.  Discharge papers printed, explained and educated.  Report called to [facility] s/w Pincus LargeSherry Johnson, LPN.  Informed of suggested FU appt and appt made.  Signed scripts and discharge papers included in packet to facility.  RN assessment and VS revealed stability for DC.  Family in room aware of discharge plans.  EMS contacted to transport to Room 100 Bed A.  Awaiting arrival.

## 2017-05-15 NOTE — Care Management Important Message (Signed)
Important Message  Patient Details  Name: Sheldon SilvanKatie Diskin MRN: [id number] Date of Birth: 05/26/1941   Medicare Important Message Given:  Yes    Marily MemosLisa M Caliya Narine, RN 05/15/2017, 12:04 PM

## 2017-05-15 NOTE — Progress Notes (Signed)
   Subjective: 3 Days Post-Op Procedure(s) (LRB): TOTAL HIP ARTHROPLASTY ANTERIOR APPROACH (Left) Patient reports pain as mild.   Patient is well, and has had no acute complaints or problems Denies any CP, SOB, ABD pain. We will continue therapy today.    Objective: Vital signs in last 24 hours: Temp:  [98.1 F (36.7 C)] 98.1 F (36.7 C) (07/05 2306) Pulse Rate:  [75-98] 75 (07/05 2306) Resp:  [18-20] 20 (07/05 2306) BP: (99-122)/(44-62) 100/44 (07/05 2306) SpO2:  [93 %-96 %] 96 % (07/05 2306)  Intake/Output from previous day: 07/05 0701 - 07/06 0700 In: 240 [P.O.:240] Out: -  Intake/Output this shift: No intake/output data recorded.   Recent Labs  05/12/17 1314 05/13/17 0327  HGB 13.5 11.7*    Recent Labs  05/12/17 1314 05/13/17 0327  WBC 10.5 7.5  RBC 4.71 4.04  HCT 40.0 34.7*  PLT 200 164    Recent Labs  05/12/17 1314 05/13/17 0327  NA  --  139  K  --  4.2  CL  --  106  CO2  --  30  BUN  --  12  CREATININE 0.91 0.77  GLUCOSE  --  110*  CALCIUM  --  8.7*   No results for input(s): LABPT, INR in the last 72 hours.  EXAM General - Patient is Alert, Appropriate and Oriented Extremity - Sensation intact distally Intact pulses distally Dorsiflexion/Plantar flexion intact No cellulitis present Compartment soft Dressing - dressing C/D/I, no drainage and wound vac intact Motor Function - intact, moving foot and toes well on exam.   Past Medical History:  Diagnosis Date  . Anxiety   . Asthma   . Complication of anesthesia    difficulty waking up from anesthesia  . GI bleed   . Hiatal hernia   . Hypertension   . IDA (iron deficiency anemia)     Assessment/Plan:   3 Days Post-Op Procedure(s) (LRB): TOTAL HIP ARTHROPLASTY ANTERIOR APPROACH (Left) Active Problems:   Primary localized osteoarthrosis of left hip  Estimated body mass index is 37.11 kg/m as calculated from the following:   Height as of this encounter: 5' (1.524 m).   Weight  as of this encounter: 86.2 kg (190 lb). Advance diet Up with therapy  Needs BM Patient doing well. VSS. Plan to discharge to the skilled nursing facility today. Remove wound VAC on 05/22/2017, apply honeycomb dressing. Follow up with [facility] in 2 weeks for staple removal and station application.   DVT Prophylaxis - Lovenox, Foot Pumps and TED hose Weight-Bearing as tolerated to left leg   T. Cranston Neighborhris Kalen Ratajczak, PA-C [facility] 05/15/2017, 7:23 AM

## 2017-05-15 NOTE — Clinical Social Work Note (Signed)
Patient to be d/c'ed today to [facility].  Patient and family agreeable to plans will transport via ems RN to call report [phone].  Windell MouldingEric Layla Gramm, MSW, Theresia MajorsLCSWA [phone]

## 2017-05-15 NOTE — Progress Notes (Signed)
EMS here for pick up.   Mazzie Brodrick, RN  

## 2017-05-15 NOTE — Discharge Instructions (Signed)

## 2017-05-15 NOTE — Discharge Summary (Addendum)
Physician Discharge Summary  Patient ID: Amanda [name] MRN: [id number] DOB/AGE: 1941-01-29 76 y.o.  Admit date: 05/12/2017 Discharge date: 05/15/2017  Admission Diagnoses:  PRIMARY OSTEOARTHRITIS OF LEFT HIP   Discharge Diagnoses: Patient Active Problem List   Diagnosis Date Noted  . Primary localized osteoarthrosis of left hip 05/12/2017  . Diarrhea 01/09/2016  . 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 Absolute anemia 02/24/2015  . Acute bronchitis 02/24/2015  . Fracture of styloid process of radius 04/13/2014    Past Medical History:  Diagnosis Date  . Anxiety   . Asthma   . Complication of anesthesia    difficulty waking up from anesthesia  . GI bleed   . Hiatal hernia   . Hypertension   . IDA (iron deficiency anemia)      Transfusion: none   Consultants (if any):   Discharged Condition: Improved  Hospital Course: Iria Jamerson is an 76 y.o. female who was admitted 05/12/2017 with a diagnosis ofLeft hip osteoarthritis and went to the operating room on 05/12/2017 and underwent the above named procedures.    Surgeries: Procedure(s): TOTAL HIP ARTHROPLASTY ANTERIOR APPROACH on 05/12/2017 Patient tolerated the surgery well. Taken to PACU where she was stabilized and then transferred to the orthopedic floor.  Started on Lovenox 40 q 24 hrs. Foot pumps applied bilaterally at 80 mm. Heels elevated on bed with rolled towels. No evidence of DVT. Negative Homan. Physical therapy started on day #1 for gait training and transfer. OT started day #1 for ADL and assisted devices.  Patient's foley was d/c on day #1. Patient's IV  was d/c on day #2.  On post op day #3 patient was stable and ready for discharge to Skilled nursing facility.  Implants: Medacta Amis for standard collared stem with 50 mm Mpact DM cup with liner and M 28 mm metal head  Please have patient wear compression stockings are in the day for 6 weeks. Remove at nighttime. Remove wound VAC on 05/22/2017 and apply honeycomb  dressing.  She was given perioperative antibiotics:  Anti-infectives    Start     Dose/Rate Route Frequency Ordered Stop   05/12/17 1600  clindamycin (CLEOCIN) IVPB 900 mg     900 mg 100 mL/hr over 30 Minutes Intravenous Every 6 hours 05/12/17 1224 05/13/17 0959   05/12/17 0917  clindamycin (CLEOCIN) 900 MG/50ML IVPB    Comments:  Nicholes Rough   : cabinet override      05/12/17 0917 05/12/17 1003   05/11/17 2245  clindamycin (CLEOCIN) IVPB 900 mg     900 mg 100 mL/hr over 30 Minutes Intravenous  Once 05/11/17 2235 05/12/17 1023    .  She was given sequential compression devices, early ambulation, and Lovenox for DVT prophylaxis.  She benefited maximally from the hospital stay and there were no complications.    Recent vital signs:  Vitals:   05/14/17 2306 05/15/17 0904  BP: (!) 100/44 (!) 112/52  Pulse: 75   Resp: 20   Temp: 98.1 F (36.7 C)     Recent laboratory studies:  Lab Results  Component Value Date   HGB 11.7 (L) 05/13/2017   HGB 13.5 05/12/2017   HGB 13.5 04/29/2017   Lab Results  Component Value Date   WBC 7.5 05/13/2017   PLT 164 05/13/2017   Lab Results  Component Value Date   INR 1.01 04/29/2017   Lab Results  Component Value Date   NA 139 05/13/2017   K 4.2 05/13/2017   CL 106 05/13/2017  CO2 30 05/13/2017   BUN 12 05/13/2017   CREATININE 0.77 05/13/2017   GLUCOSE 110 (H) 05/13/2017    Discharge Medications:   Allergies as of 05/15/2017      Reactions   Other Other (See Comments)   general anesthesia=difficulty waking   Codeine Nausea And Vomiting   Penicillins Hives   Has patient had a PCN reaction causing immediate rash, facial/tongue/throat swelling, SOB or lightheadedness with hypotension: Yes Has patient had a PCN reaction causing severe rash involving mucus membranes or skin necrosis: Unknown Has patient had a PCN reaction that required hospitalization: No Has patient had a PCN reaction occurring within the last 10 years: No If  all of the above answers are "NO", then may proceed with Cephalosporin use.      Medication List    TAKE these medications   albuterol 108 (90 Base) MCG/ACT inhaler Commonly known as:  PROVENTIL HFA;VENTOLIN HFA Inhale into the lungs every 6 (six) hours as needed for wheezing or shortness of breath.   ALPRAZolam 1 MG tablet Commonly known as:  XANAX Take 0.5 mg by mouth 3 (three) times daily as needed (for anxiety).   amLODipine 5 MG tablet Commonly known as:  NORVASC Take 5 mg by mouth daily.   ascorbic acid 1000 MG tablet Commonly known as:  VITAMIN C Take 1,000 mg by mouth every evening.   carisoprodol 350 MG tablet Commonly known as:  SOMA Take 175 mg by mouth 3 (three) times daily as needed (for legs camps).   carvedilol 6.25 MG tablet Commonly known as:  COREG Take 6.25 mg by mouth 2 (two) times daily.   cholecalciferol 1000 units tablet Commonly known as:  VITAMIN D Take 1,000 Units by mouth every evening.   CREON 36000 UNITS Cpep capsule Generic drug:  lipase/protease/amylase Take 36,000 Units by mouth 3 (three) times daily with meals.   cyclobenzaprine 5 MG tablet Commonly known as:  FLEXERIL Take 5 mg by mouth 3 (three) times daily as needed for muscle spasms.   diphenoxylate-atropine 2.5-0.025 MG tablet Commonly known as:  LOMOTIL Take 2 tablets by mouth 4 (four) times daily as needed for diarrhea or loose stools.   enoxaparin 40 MG/0.4ML injection Commonly known as:  LOVENOX Inject 0.4 mLs (40 mg total) into the skin daily. Start taking on:  05/16/2017   ferrous sulfate 325 (65 FE) MG tablet Take 325 mg by mouth every evening.   HYDROcodone-acetaminophen 10-325 MG tablet Commonly known as:  NORCO Take 1 tablet by mouth every 6 (six) hours as needed (FOR pain).   omeprazole 20 MG capsule Commonly known as:  PRILOSEC Take 20 mg by mouth daily before breakfast.   oxyCODONE 5 MG immediate release tablet Commonly known as:  Oxy IR/ROXICODONE Take  1-2 tablets (5-10 mg total) by mouth every 3 (three) hours as needed for breakthrough pain.   oxyCODONE 15 mg 12 hr tablet Commonly known as:  OXYCONTIN Take 1 tablet (15 mg total) by mouth every 12 (twelve) hours.            Durable Medical Equipment        Start     Ordered   05/12/17 1225  DME Walker rolling  Once    Question:  Patient needs a walker to treat with the following condition  Answer:  Status post total hip replacement, left   05/12/17 1224   05/12/17 1225  DME 3 n 1  Once     05/12/17 1224   05/12/17  1225  DME Bedside commode  Once    Question:  Patient needs a bedside commode to treat with the following condition  Answer:  Status post total hip replacement, left   05/12/17 1224      Diagnostic Studies: Mr Hip Left Wo Contrast  Result Date: 04/15/2017 CLINICAL DATA:  Left hip pain.  Ongoing for 2 years. EXAM: MR OF THE LEFT HIP WITHOUT CONTRAST TECHNIQUE: Multiplanar, multisequence MR imaging was performed. No intravenous contrast was administered. COMPARISON:  None. FINDINGS: Bones: No hip fracture, dislocation or avascular necrosis. No periosteal reaction or bone destruction. No aggressive osseous lesion. Mild osteoarthritis of the left sacroiliac joint. No SI joint widening or erosive changes. Degenerative disc disease with disc height loss at L3-4, L4-5 and L5-S1. Articular cartilage and labrum Articular cartilage: High-grade partial-thickness cartilage loss of the left femoral head and acetabulum with subchondral reactive marrow edema in the acetabulum and femoral head. Mild partial-thickness cartilage loss of the right femoral head and acetabulum. Labrum: Grossly intact, but evaluation is limited by lack of intraarticular fluid. Joint or bursal effusion Joint effusion: Small left hip joint effusion. No right hip joint effusion. Bursae: Small amount of fluid in the right greater trochanteric bursa. Small amount of fluid in the left greater trochanteric bursa. Muscles  and tendons Flexors: Normal. Extensors: Normal. Abductors: Normal. Adductors: Normal. Gluteals: Normal. Hamstrings: Small partial tear of the right hamstring origin. Mild tendinosis of the left hamstring origin. Other findings Miscellaneous: No pelvic free fluid. No fluid collection or hematoma. No inguinal lymphadenopathy. No inguinal hernia. Sigmoid diverticulosis without evidence of diverticulitis. IMPRESSION: 1. Moderate osteoarthritis of the left hip. 2. Mild osteoarthritis of the right hip. 3. Mild bilateral greater trochanteric bursitis. 4. Mild tendinosis of the left hamstring origin. Electronically Signed   By: Elige Ko   On: 04/15/2017 12:39   Dg Hip Operative Unilat W Or W/o Pelvis Left  Result Date: 05/12/2017 CLINICAL DATA:  Status post left total left hip prosthesis placement. EXAM: OPERATIVE left HIP (WITH PELVIS IF PERFORMED) 2 VIEWS TECHNIQUE: Fluoroscopic spot image(s) were submitted for interpretation post-operatively. COMPARISON:  Preoperative MRI of the hips of April 15, 2017 FINDINGS: Two fluoro spot images with reported fluoro time of 12 seconds are reviewed. The initial images is a pre operative image which reveals the native left hip. Second image reveals the total hip joint prosthesis. The distal 1/2 of the femoral stem of the prosthesis is not included in the field of view. IMPRESSION: The patient has undergone left total hip joint prosthesis placement. Limited visualization of the prosthesis reveals no immediate complication. Electronically Signed   By: David  [place] M.D.   On: 05/12/2017 11:19   Dg Hip Unilat W Or W/o Pelvis 2-3 Views Left  Result Date: 05/12/2017 CLINICAL DATA:  76 year old female post total left hip replacement. Subsequent encounter. EXAM: DG HIP (WITH OR WITHOUT PELVIS) 2-3V LEFT COMPARISON:  04/15/2017 MR. FINDINGS: Post total left hip replacement without complication noted. Anterior angulation acetabular component. Drain in place. IMPRESSION: Left total hip  replacement without complication noted. Electronically Signed   By: Lacy Duverney M.D.   On: 05/12/2017 12:27    Disposition: 01-Home or Self Care    Contact information for after-discharge care    Destination    [facility] .   Specialty:  Skilled Nursing Facility Contact information: [address] [place] [address] [phone]               Signed: Floyce Stakes,  THOMAS CHRISTOPHER 05/15/2017, 12:00 PM

## 2017-05-15 NOTE — Progress Notes (Signed)
Physical Therapy Treatment Patient Details Name: Amanda SilvanKatie Gopaul MRN: [id number] DOB: 04/26/1941 Today's Date: 05/15/2017    History of Present Illness 76 y/o s/p a L THA (anterior approach) performed on 05/12/17. PMH includes asthma, HTN, anxiety, hiatal hernia, and anemia.    PT Comments    Pt progressing slowly toward her goals. This session limited by pain and anxiousness/apprehension to move LLE. Pt ambulated from EOB to door (4015ft) on room air with RW and min guard (occasional min assist). SAO2 after ambulating dropped to 88%, but recovered to 93% within 30s. Pt's SAO2 at rest on room air remained at 93%, so PT left pt on 2L of O2 in recliner. Pt states she tolerated mobilizing better today, however pain is the primary limiting factor to pt's current functional status. Will continue to progress.    Follow Up Recommendations  SNF     Equipment Recommendations  3in1 (PT)    Recommendations for Other Services       Precautions / Restrictions Precautions Precautions: Fall Restrictions Weight Bearing Restrictions: Yes LLE Weight Bearing: Weight bearing as tolerated    Mobility  Bed Mobility Overal bed mobility: Needs Assistance Bed Mobility: Supine to Sit     Supine to sit: Min assist     General bed mobility comments: Min assist for LLE management. Pt able to use BUE on handrail to get EOB. Performed on room air: SAO2 remained above 90%.   Transfers Overall transfer level: Needs assistance Equipment used: Rolling walker (2 wheeled) Transfers: Sit to/from Stand Sit to Stand: Min guard;Min assist         General transfer comment: Min guard to min assist. Verbal cueing for hand placement and encouragement. Performed on room air: SAO2 remained above 90%.  Ambulation/Gait Ambulation/Gait assistance: Min guard Ambulation Distance (Feet): 15 Feet Assistive device: Rolling walker (2 wheeled) Gait Pattern/deviations: Step-to pattern;Decreased stance time - left;Trunk  flexed;Wide base of support;Antalgic;Decreased weight shift to left;Decreased step length - right     General Gait Details: Ambulated from EOB to door on room air with RW and min guard (occasional min assist). SAO2 dropped to 88% once seated in chair, but recovered to 93% within 30s of pursed lip breathing. Ambulation very painful for pt, but she states she tolerated it better than yesterday.   Stairs            Wheelchair Mobility    Modified Rankin (Stroke Patients Only)       Balance Overall balance assessment: Needs assistance     Sitting balance - Comments: Sat EOB for 1min with BUE support and feet supported. No unsteadiness noted. Pt apprehensive in this position due to L hip pain.       Standing balance comment: Stood with BUE support on RW and 25-50% weight through LLE. No unsteadiness noted, but pt hesitant to put weight through LLE due to pain.                             Cognition Arousal/Alertness: Awake/alert Behavior During Therapy: WFL for tasks assessed/performed;Impulsive Overall Cognitive Status: Within Functional Limits for tasks assessed                                 General Comments: Anxious, nervous and apprehensive with any LLE movement.      Exercises Other Exercises Other Exercises: Supine ther-ex x12 on LLE included quad set  and ankle pumps. Sit to/from stand performed 2x this visit (see above for details). Pt educated on pursed lip breathing while mobilizing to maintain SAO2 above 90%.     General Comments        Pertinent Vitals/Pain Pain Assessment: 0-10 Pain Score: 8  Pain Location: L hip Pain Descriptors / Indicators: Grimacing;Operative site guarding;Moaning;Other (Comment) Pain Intervention(s): Limited activity within patient's tolerance;Repositioned;Premedicated before session;Monitored during session    Home Living                      Prior Function            PT Goals (current goals  can now be found in the care plan section) Acute Rehab PT Goals Patient Stated Goal: to go home PT Goal Formulation: With patient Time For Goal Achievement: 05/26/17 Potential to Achieve Goals: Good Progress towards PT goals: Progressing toward goals ((slowly))    Frequency    BID      PT Plan Current plan remains appropriate    Co-evaluation              AM-PAC PT "6 Clicks" Daily Activity  Outcome Measure  Difficulty turning over in bed (including adjusting bedclothes, sheets and blankets)?: A Lot Difficulty moving from lying on back to sitting on the side of the bed? : Total Difficulty sitting down on and standing up from a chair with arms (e.g., wheelchair, bedside commode, etc,.)?: Total Help needed moving to and from a bed to chair (including a wheelchair)?: A Little Help needed walking in hospital room?: A Little Help needed climbing 3-5 steps with a railing? : Total 6 Click Score: 11    End of Session Equipment Utilized During Treatment: Gait belt Activity Tolerance: Patient limited by pain Patient left: in chair;with chair alarm set Nurse Communication: Mobility status PT Visit Diagnosis: Other abnormalities of gait and mobility (R26.89);History of falling (Z91.81);Muscle weakness (generalized) (M62.81);Pain Pain - Right/Left: Left Pain - part of body: Hip     Time: 1610-9604 PT Time Calculation (min) (ACUTE ONLY): 34 min  Charges:                       G Codes:       Mabeline Caras, PT, SPT  [place] 05/15/2017, 10:11 AM

## 2017-08-13 ENCOUNTER — Encounter: Payer: Self-pay | Admitting: Emergency Medicine

## 2017-08-13 ENCOUNTER — Emergency Department
Admission: EM | Admit: 2017-08-13 | Discharge: 2017-08-13 | Disposition: A | Discharge status: Home / Self Care | Payer: Medicare Other | Attending: Emergency Medicine | Admitting: Emergency Medicine

## 2017-08-13 DIAGNOSIS — R600 Localized edema: Secondary | ICD-10-CM | POA: Diagnosis not present

## 2017-08-13 DIAGNOSIS — R2243 Localized swelling, mass and lump, lower limb, bilateral: Secondary | ICD-10-CM | POA: Diagnosis present

## 2017-08-13 DIAGNOSIS — Z96642 Presence of left artificial hip joint: Secondary | ICD-10-CM | POA: Diagnosis not present

## 2017-08-13 DIAGNOSIS — Z87891 Personal history of nicotine dependence: Secondary | ICD-10-CM | POA: Insufficient documentation

## 2017-08-13 DIAGNOSIS — J45909 Unspecified asthma, uncomplicated: Secondary | ICD-10-CM | POA: Insufficient documentation

## 2017-08-13 DIAGNOSIS — I10 Essential (primary) hypertension: Secondary | ICD-10-CM | POA: Insufficient documentation

## 2017-08-13 DIAGNOSIS — F419 Anxiety disorder, unspecified: Secondary | ICD-10-CM | POA: Diagnosis not present

## 2017-08-13 DIAGNOSIS — R609 Edema, unspecified: Secondary | ICD-10-CM

## 2017-08-13 LAB — COMPREHENSIVE METABOLIC PANEL
ALBUMIN: 4.1 g/dL (ref 3.5–5.0)
ALK PHOS: 76 U/L (ref 38–126)
ALT: 13 U/L — AB (ref 14–54)
ANION GAP: 9 (ref 5–15)
AST: 21 U/L (ref 15–41)
BILIRUBIN TOTAL: 0.6 mg/dL (ref 0.3–1.2)
BUN: 19 mg/dL (ref 6–20)
CALCIUM: 9.4 mg/dL (ref 8.9–10.3)
CO2: 27 mmol/L (ref 22–32)
Chloride: 106 mmol/L (ref 101–111)
Creatinine, Ser: 0.84 mg/dL (ref 0.44–1.00)
GFR calc Af Amer: >60 mL/min (ref >60)
GFR calc non Af Amer: >60 mL/min (ref >60)
GLUCOSE: 99 mg/dL (ref 65–99)
Potassium: 4.2 mmol/L (ref 3.5–5.1)
SODIUM: 142 mmol/L (ref 135–145)
TOTAL PROTEIN: 7.2 g/dL (ref 6.5–8.1)

## 2017-08-13 LAB — CBC WITH DIFFERENTIAL/PLATELET
BASOS ABS: 0.1 10*3/uL (ref 0–0.1)
BASOS PCT: 1 %
EOS PCT: 3 %
Eosinophils Absolute: 0.1 10*3/uL (ref 0–0.7)
HEMATOCRIT: 37.5 % (ref 35.0–47.0)
Hemoglobin: 12.7 g/dL (ref 12.0–16.0)
Lymphocytes Relative: 31 %
Lymphs Abs: 1.4 10*3/uL (ref 1.0–3.6)
MCH: 27.9 pg (ref 26.0–34.0)
MCHC: 33.9 g/dL (ref 32.0–36.0)
MCV: 82.4 fL (ref 80.0–100.0)
MONO ABS: 0.3 10*3/uL (ref 0.2–0.9)
MONOS PCT: 6 %
NEUTROS ABS: 2.7 10*3/uL (ref 1.4–6.5)
Neutrophils Relative %: 59 %
PLATELETS: 220 10*3/uL (ref 150–440)
RBC: 4.55 MIL/uL (ref 3.80–5.20)
RDW: 15.6 % — AB (ref 11.5–14.5)
WBC: 4.7 10*3/uL (ref 3.6–11.0)

## 2017-08-13 MED ORDER — FUROSEMIDE 20 MG PO TABS: 20.0000 mg | ORAL_TABLET | Freq: Every day | ORAL | 0 refills | Status: DC

## 2017-08-13 NOTE — ED Triage Notes (Signed)
Patient presents to ED via POV from home with c/o left lower leg edema and redness x 1 week. Skin to taut.

## 2017-08-13 NOTE — ED Provider Notes (Signed)
[facility] Emergency Department Provider Note       Time seen: ----------------------------------------- 2:37 PM on 08/13/2017 -----------------------------------------     I have reviewed the triage vital signs and the nursing notes.   HISTORY   Chief Complaint Leg Swelling    HPI Amanda [name] is a 76 y.o. female who presents to the ED for bilateral edema for one to 2 weeks. Patient states her legs feel tight and she has not had a history of this before. Discomfort is 7 out of 10 and also this has caused some itching in her legs. She denies fevers, chills, chest pain, shortness of breath, vomiting or diarrhea. Patient currently is taking care of a sick animal and is up on her feet more than normal.  Past Medical History:  Diagnosis Date  . Anxiety   . Asthma   . Complication of anesthesia    difficulty waking up from anesthesia  . GI bleed   . Hiatal hernia   . Hypertension   . IDA (iron deficiency anemia)     Patient Active Problem List   Diagnosis Date Noted  . Primary localized osteoarthrosis of left hip 05/12/2017  . Diarrhea 01/09/2016  . 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 Absolute anemia 02/24/2015  . Acute bronchitis 02/24/2015  . Fracture of styloid process of radius 04/13/2014    Past Surgical History:  Procedure Laterality Date  . ABDOMINAL HYSTERECTOMY    . BACK SURGERY     fusion  . BILATERAL CARPAL TUNNEL RELEASE    . BREAST BIOPSY Left 01/17/2002  . collapsed  lung Left   . COLONOSCOPY  04/28/2012  . COLONOSCOPY  02-12-15   Dr Lemar Livings  . ESOPHAGEAL MANOMETRY N/A 06/13/2015   Procedure: ESOPHAGEAL MANOMETRY (EM);  Surgeon: Elnita Maxwell, MD;  Location: [facility] ENDOSCOPY;  Service: Endoscopy;  Laterality: N/A;  . ESOPHAGUS SURGERY  Oct 2016   Dr Alva Garnet in [place]  . KNEE ARTHROSCOPY Left   . PARAESOPHAGEAL HERNIA REPAIR  07-26-15   Dr Alva Garnet  . PARATHYROIDECTOMY Left    one gland on left removed  . TOTAL HIP  ARTHROPLASTY Left 05/12/2017   Procedure: TOTAL HIP ARTHROPLASTY ANTERIOR APPROACH;  Surgeon: Kennedy Bucker, MD;  Location: [facility] ORS;  Service: Orthopedics;  Laterality: Left;  . UPPER GI ENDOSCOPY  04/28/2012    Allergies Other; Codeine; and Penicillins  Social History Social History  Substance Use Topics  . Smoking status: Former Smoker    Packs/day: 1.00    Years: 30.00    Types: Cigarettes  . Smokeless tobacco: Never Used  . Alcohol use No    Review of Systems Constitutional: Negative for fever. Cardiovascular: Negative for chest pain. Respiratory: Negative for shortness of breath. Gastrointestinal: Negative for abdominal pain, vomiting and diarrhea. Genitourinary: Negative for dysuria. Musculoskeletal: positive for bilateral leg pain and swelling Skin: Negative for rash. Neurological: Negative for headaches, focal weakness or numbness.  All systems negative/normal/unremarkable except as stated in the HPI  ____________________________________________   PHYSICAL EXAM:  VITAL SIGNS: ED Triage Vitals  Enc Vitals Group     BP 08/13/17 1031 (!) 150/87     Pulse Rate 08/13/17 1031 82     Resp 08/13/17 1031 18     Temp 08/13/17 1031 98.6 F (37 C)     Temp Source 08/13/17 1031 Oral     SpO2 08/13/17 1031 97 %     Weight 08/13/17 1037 182 lb (82.6 kg)     Height 08/13/17 1037 5'  2" (1.575 m)     Head Circumference --      Peak Flow --      Pain Score 08/13/17 1037 7     Pain Loc --      Pain Edu? --      Excl. in GC? --     Constitutional: Alert and oriented. Well appearing and in no distress. Eyes: Conjunctivae are normal. Normal extraocular movements. Cardiovascular: Normal rate, regular rhythm. No murmurs, rubs, or gallops.good peripheral pulses Respiratory: Normal respiratory effort without tachypnea nor retractions. Breath sounds are clear and equal bilaterally. No wheezes/rales/rhonchi. Gastrointestinal: Soft and nontender. Normal bowel  sounds Musculoskeletal: bilateral mild edema to the knees Neurologic:  Normal speech and language. No gross focal neurologic deficits are appreciated.  Skin:  Skin is warm, dry and intact. No rash noted. Psychiatric: depressed mood  ____________________________________________  ED COURSE:  Pertinent labs & imaging results that were available during my care of the patient were reviewed by me and considered in my medical decision making (see chart for details). Patient presents for peripheral edema, we will assess with labs and consider imaging as indicated.   Procedures ____________________________________________   LABS (pertinent positives/negatives)  Labs Reviewed  COMPREHENSIVE METABOLIC PANEL - Abnormal; Notable for the following:       Result Value   ALT 13 (*)    All other components within normal limits  CBC WITH DIFFERENTIAL/PLATELET - Abnormal; Notable for the following:    RDW 15.6 (*)    All other components within normal limits   ___________________________________________  DIFFERENTIAL DIAGNOSIS   peripheral edema, DVT, cellulitis, vasculitis, renal failure, heart failure  FINAL ASSESSMENT AND PLAN  peripheral edema   Plan: Patient's labs were dictated above. Patient had presented for leg swelling which appears to be from peripheral edema likely because she is up on her feet more than normal. We will place him a short course of Lasix and advised her to wear compression stockings. Labs here are reassuring and I do not see signs clinically of DVT.  Emily Filbert, MD   Note: This note was generated in part or whole with voice recognition software. Voice recognition is usually quite accurate but there are transcription errors that can and very often do occur. I apologize for any typographical errors that were not detected and corrected.     Emily Filbert, MD 08/13/17 [phone]

## 2017-08-13 NOTE — ED Notes (Signed)
ED Provider at bedside. 

## 2017-11-16 ENCOUNTER — Other Ambulatory Visit: Payer: Self-pay | Admitting: Orthopedic Surgery

## 2017-11-16 DIAGNOSIS — M1611 Unilateral primary osteoarthritis, right hip: Secondary | ICD-10-CM

## 2017-11-21 ENCOUNTER — Ambulatory Visit
Admission: RE | Admit: 2017-11-21 | Discharge: 2017-11-21 | Disposition: A | Discharge status: Home / Self Care | Payer: Medicare Other | Source: Ambulatory Visit | Attending: Orthopedic Surgery | Admitting: Orthopedic Surgery

## 2017-11-21 DIAGNOSIS — M7061 Trochanteric bursitis, right hip: Secondary | ICD-10-CM | POA: Diagnosis not present

## 2017-11-21 DIAGNOSIS — M1611 Unilateral primary osteoarthritis, right hip: Secondary | ICD-10-CM

## 2017-11-21 DIAGNOSIS — M25551 Pain in right hip: Secondary | ICD-10-CM | POA: Insufficient documentation

## 2018-02-18 ENCOUNTER — Other Ambulatory Visit: Payer: Self-pay

## 2018-02-18 ENCOUNTER — Encounter
Admission: RE | Admit: 2018-02-18 | Discharge: 2018-02-18 | Disposition: A | Discharge status: Home / Self Care | Payer: Medicare Other | Source: Ambulatory Visit | Attending: Orthopedic Surgery | Admitting: Orthopedic Surgery

## 2018-02-18 DIAGNOSIS — Z01818 Encounter for other preprocedural examination: Secondary | ICD-10-CM | POA: Diagnosis present

## 2018-02-18 DIAGNOSIS — I10 Essential (primary) hypertension: Secondary | ICD-10-CM

## 2018-02-18 DIAGNOSIS — Z01812 Encounter for preprocedural laboratory examination: Secondary | ICD-10-CM | POA: Diagnosis not present

## 2018-02-18 DIAGNOSIS — Z0183 Encounter for blood typing: Secondary | ICD-10-CM | POA: Diagnosis not present

## 2018-02-18 DIAGNOSIS — Z01810 Encounter for preprocedural cardiovascular examination: Secondary | ICD-10-CM

## 2018-02-18 LAB — CBC
HCT: 38.3 % (ref 35.0–47.0)
Hemoglobin: 13 g/dL (ref 12.0–16.0)
MCH: 29.4 pg (ref 26.0–34.0)
MCHC: 34 g/dL (ref 32.0–36.0)
MCV: 86.4 fL (ref 80.0–100.0)
PLATELETS: 300 10*3/uL (ref 150–440)
RBC: 4.43 MIL/uL (ref 3.80–5.20)
RDW: 14.4 % (ref 11.5–14.5)
WBC: 4.7 10*3/uL (ref 3.6–11.0)

## 2018-02-18 LAB — BASIC METABOLIC PANEL
Anion gap: 7 (ref 5–15)
BUN: 18 mg/dL (ref 6–20)
CHLORIDE: 107 mmol/L (ref 101–111)
CO2: 26 mmol/L (ref 22–32)
CREATININE: 0.84 mg/dL (ref 0.44–1.00)
Calcium: 9.4 mg/dL (ref 8.9–10.3)
GFR calc Af Amer: >60 mL/min (ref >60)
GFR calc non Af Amer: >60 mL/min (ref >60)
Glucose, Bld: 94 mg/dL (ref 65–99)
Potassium: 4.1 mmol/L (ref 3.5–5.1)
Sodium: 140 mmol/L (ref 135–145)

## 2018-02-18 LAB — SURGICAL PCR SCREEN
MRSA, PCR: NEGATIVE
Staphylococcus aureus: NEGATIVE

## 2018-02-18 LAB — URINALYSIS, ROUTINE W REFLEX MICROSCOPIC
Bilirubin Urine: NEGATIVE
Glucose, UA: NEGATIVE mg/dL
Hgb urine dipstick: NEGATIVE
Ketones, ur: NEGATIVE mg/dL
LEUKOCYTES UA: NEGATIVE
NITRITE: NEGATIVE
PH: 6 (ref 5.0–8.0)
Protein, ur: NEGATIVE mg/dL
SPECIFIC GRAVITY, URINE: 1.013 (ref 1.005–1.030)

## 2018-02-18 LAB — PROTIME-INR
INR: 0.95
Prothrombin Time: 12.6 seconds (ref 11.4–15.2)

## 2018-02-18 LAB — APTT: aPTT: 41 seconds — ABNORMAL HIGH (ref 24–36)

## 2018-02-18 LAB — SEDIMENTATION RATE: SED RATE: 54 mm/h — AB (ref 0–30)

## 2018-02-18 NOTE — Progress Notes (Signed)
Received call from blood bank re: positive ab screen.  Units will be ready for surgery date 4.25.2019.

## 2018-02-18 NOTE — Patient Instructions (Signed)
Your procedure is scheduled on: Thurs. 03/04/18 Report to Day Surgery. To find out your arrival time please call [phone] between 1PM - 3PM on Wed. 03/03/18 .  Remember: Instructions that are not followed completely may result in serious medical risk, up to and including death, or upon the discretion of your surgeon and anesthesiologist your surgery may need to be rescheduled.     _X__ 1. Do not eat food after midnight the night before your procedure.                 No gum chewing or hard candies. You may drink clear liquids up to 2 hours                 before you are scheduled to arrive for your surgery- DO not drink clear                 liquids within 2 hours of the start of your surgery.                 Clear Liquids include:  water, apple juice without pulp, clear carbohydrate                 drink such as Clearfast of Gartorade, Black Coffee or Tea (Do not add                 anything to coffee or tea).  __X__2.  On the morning of surgery brush your teeth with toothpaste and water, you  may rinse your mouth with mouthwash if you wish.  Do not swallow any  toothpaste of mouthwash.     _X__ 3.  No Alcohol for 24 hours before or after surgery.   ___ 4.  Do Not Smoke or use e-cigarettes For 24 Hours Prior to Your Surgery.                 Do not use any chewable tobacco products for at least 6 hours prior to                 surgery.  ____  5.  Bring all medications with you on the day of surgery if instructed.   _x___  6.  Notify your doctor if there is any change in your medical condition      (cold, fever, infections).     Do not wear jewelry, make-up, hairpins, clips or nail polish. Do not wear lotions, powders, or perfumes. You may wear deodorant. Do not shave 48 hours prior to surgery. Men may shave face and neck. Do not bring valuables to the hospital.    [facility] is not responsible for any belongings or valuables.  Contacts, dentures or  bridgework may not be worn into surgery. Leave your suitcase in the car. After surgery it may be brought to your room. For patients admitted to the hospital, discharge time is determined by your treatment team.   Patients discharged the day of surgery will not be allowed to drive home.   Please read over the following fact sheets that you were given:     __x__ Take these medicines the morning of surgery with A SIP OF WATER:    1. ALPRAZolam (XANAX) 1 MG tablet as needed  2. amLODipine (NORVASC) 5 MG tablet  3. carvedilol (COREG) 6.25 MG tablet  4.HYDROcodone-acetaminophen (NORCO) 10-325 MG per tablet if needed  5.diphenoxylate-atropine (LOMOTIL) 2.5-0.025 MG tablet if needed  6.  ____ Fleet Enema (as directed)  __x__ Use CHG Soap as directed  _x___ Use inhalers on the day of surgery albuterol (PROVENTIL HFA;VENTOLIN HFA) 108 (90 BASE) MCG/ACT inhaler  ____ Stop metformin 2 days prior to surgery    ____ Take 1/2 of usual insulin dose the night before surgery. No insulin the morning          of surgery.   ____ Stop Coumadin/Plavix/aspirin on   ____ Stop Anti-inflammatories on    ____ Stop supplements until after surgery.    ____ Bring C-Pap to the hospital.

## 2018-03-03 MED ORDER — CLINDAMYCIN PHOSPHATE 900 MG/50ML IV SOLN
900.0000 mg | Freq: Once | INTRAVENOUS | Status: AC
  Administered 2018-03-04: 900 mg via INTRAVENOUS

## 2018-03-03 MED ORDER — TRANEXAMIC ACID 1000 MG/10ML IV SOLN
1000.0000 mg | INTRAVENOUS | Status: DC
  Filled 2018-03-03: qty 10

## 2018-03-04 ENCOUNTER — Inpatient Hospital Stay: Payer: Medicare Other | Admitting: Anesthesiology

## 2018-03-04 ENCOUNTER — Inpatient Hospital Stay: Payer: Medicare Other

## 2018-03-04 ENCOUNTER — Inpatient Hospital Stay
Admission: RE | Admit: 2018-03-04 | Discharge: 2018-03-07 | DRG: 470 | Disposition: A | Discharge status: Skilled Nursing Facility | Payer: Medicare Other | Source: Ambulatory Visit | Attending: Orthopedic Surgery | Admitting: Orthopedic Surgery

## 2018-03-04 ENCOUNTER — Other Ambulatory Visit: Payer: Self-pay

## 2018-03-04 ENCOUNTER — Encounter
Admission: RE | Disposition: A | Discharge status: Skilled Nursing Facility | Payer: Self-pay | Source: Ambulatory Visit | Attending: Orthopedic Surgery

## 2018-03-04 DIAGNOSIS — D62 Acute posthemorrhagic anemia: Secondary | ICD-10-CM | POA: Diagnosis present

## 2018-03-04 DIAGNOSIS — G8918 Other acute postprocedural pain: Secondary | ICD-10-CM

## 2018-03-04 DIAGNOSIS — I10 Essential (primary) hypertension: Secondary | ICD-10-CM | POA: Diagnosis present

## 2018-03-04 DIAGNOSIS — Z87891 Personal history of nicotine dependence: Secondary | ICD-10-CM | POA: Diagnosis not present

## 2018-03-04 DIAGNOSIS — Z885 Allergy status to narcotic agent status: Secondary | ICD-10-CM

## 2018-03-04 DIAGNOSIS — Z96642 Presence of left artificial hip joint: Secondary | ICD-10-CM | POA: Diagnosis present

## 2018-03-04 DIAGNOSIS — Z419 Encounter for procedure for purposes other than remedying health state, unspecified: Secondary | ICD-10-CM

## 2018-03-04 DIAGNOSIS — Z880 Allergy status to penicillin: Secondary | ICD-10-CM | POA: Diagnosis not present

## 2018-03-04 DIAGNOSIS — Z96643 Presence of artificial hip joint, bilateral: Secondary | ICD-10-CM

## 2018-03-04 DIAGNOSIS — M1611 Unilateral primary osteoarthritis, right hip: Principal | ICD-10-CM | POA: Diagnosis present

## 2018-03-04 DIAGNOSIS — F419 Anxiety disorder, unspecified: Secondary | ICD-10-CM | POA: Diagnosis present

## 2018-03-04 DIAGNOSIS — Z96641 Presence of right artificial hip joint: Secondary | ICD-10-CM

## 2018-03-04 DIAGNOSIS — J45909 Unspecified asthma, uncomplicated: Secondary | ICD-10-CM | POA: Diagnosis present

## 2018-03-04 DIAGNOSIS — R0902 Hypoxemia: Secondary | ICD-10-CM

## 2018-03-04 DIAGNOSIS — Z8051 Family history of malignant neoplasm of kidney: Secondary | ICD-10-CM | POA: Diagnosis not present

## 2018-03-04 HISTORY — PX: TOTAL HIP ARTHROPLASTY: SHX124

## 2018-03-04 LAB — CBC
HCT: 35.5 % (ref 35.0–47.0)
Hemoglobin: 12.2 g/dL (ref 12.0–16.0)
MCH: 29.4 pg (ref 26.0–34.0)
MCHC: 34.2 g/dL (ref 32.0–36.0)
MCV: 85.9 fL (ref 80.0–100.0)
Platelets: 189 10*3/uL (ref 150–440)
RBC: 4.14 MIL/uL (ref 3.80–5.20)
RDW: 14 % (ref 11.5–14.5)
WBC: 5.2 10*3/uL (ref 3.6–11.0)

## 2018-03-04 LAB — CREATININE, SERUM
Creatinine, Ser: 0.81 mg/dL (ref 0.44–1.00)
GFR calc non Af Amer: >60 mL/min (ref >60)

## 2018-03-04 SURGERY — ARTHROPLASTY, HIP, TOTAL, ANTERIOR APPROACH
Anesthesia: Spinal | Site: Hip | Laterality: Right | Wound class: Clean

## 2018-03-04 MED ORDER — BUPIVACAINE HCL (PF) 0.5 % IJ SOLN
INTRAMUSCULAR | Status: DC | PRN
  Administered 2018-03-04: 3 mL

## 2018-03-04 MED ORDER — METHOCARBAMOL 500 MG PO TABS
500.0000 mg | ORAL_TABLET | Freq: Four times a day (QID) | ORAL | Status: DC | PRN
  Administered 2018-03-05: 500 mg via ORAL
  Filled 2018-03-04: qty 1

## 2018-03-04 MED ORDER — MAGNESIUM CITRATE PO SOLN: 1.0000 | Freq: Once | ORAL | Status: DC | PRN

## 2018-03-04 MED ORDER — FAMOTIDINE 20 MG PO TABS
20.0000 mg | ORAL_TABLET | Freq: Once | ORAL | Status: AC
  Administered 2018-03-04: 20 mg via ORAL

## 2018-03-04 MED ORDER — DEXMEDETOMIDINE HCL IN NACL 200 MCG/50ML IV SOLN
INTRAVENOUS | Status: DC | PRN
  Administered 2018-03-04: 8 ug via INTRAVENOUS

## 2018-03-04 MED ORDER — DOCUSATE SODIUM 100 MG PO CAPS
100.0000 mg | ORAL_CAPSULE | Freq: Two times a day (BID) | ORAL | Status: DC
  Administered 2018-03-04 – 2018-03-07 (×5): 100 mg via ORAL
  Filled 2018-03-04 (×6): qty 1

## 2018-03-04 MED ORDER — BUPIVACAINE-EPINEPHRINE (PF) 0.25% -1:200000 IJ SOLN
INTRAMUSCULAR | Status: DC | PRN
  Administered 2018-03-04: 30 mL via PERINEURAL

## 2018-03-04 MED ORDER — ZOLPIDEM TARTRATE 5 MG PO TABS: 5.0000 mg | ORAL_TABLET | Freq: Every evening | ORAL | Status: DC | PRN

## 2018-03-04 MED ORDER — ALBUTEROL SULFATE (2.5 MG/3ML) 0.083% IN NEBU: 3.0000 mL | INHALATION_SOLUTION | Freq: Four times a day (QID) | RESPIRATORY_TRACT | Status: DC | PRN

## 2018-03-04 MED ORDER — ONDANSETRON HCL 4 MG/2ML IJ SOLN
4.0000 mg | Freq: Four times a day (QID) | INTRAMUSCULAR | Status: DC | PRN
Start: 2018-03-04 — End: 2018-03-07
  Administered 2018-03-05: 4 mg via INTRAVENOUS
  Filled 2018-03-04: qty 2

## 2018-03-04 MED ORDER — NEOMYCIN-POLYMYXIN B GU 40-200000 IR SOLN
Status: AC
  Filled 2018-03-04: qty 4

## 2018-03-04 MED ORDER — METOCLOPRAMIDE HCL 5 MG/ML IJ SOLN: 5.0000 mg | Freq: Three times a day (TID) | INTRAMUSCULAR | Status: DC | PRN

## 2018-03-04 MED ORDER — PHENYLEPHRINE HCL 10 MG/ML IJ SOLN
INTRAMUSCULAR | Status: DC | PRN
  Administered 2018-03-04 (×4): 50 ug via INTRAVENOUS

## 2018-03-04 MED ORDER — PROMETHAZINE HCL 25 MG/ML IJ SOLN: 6.2500 mg | INTRAMUSCULAR | Status: DC | PRN

## 2018-03-04 MED ORDER — SODIUM CHLORIDE 0.9 % IV SOLN
INTRAVENOUS | Status: DC
  Administered 2018-03-04 – 2018-03-05 (×2): via INTRAVENOUS
  Administered 2018-03-05: 75 mL/h via INTRAVENOUS

## 2018-03-04 MED ORDER — OXYCODONE HCL 5 MG PO TABS
5.0000 mg | ORAL_TABLET | ORAL | Status: DC | PRN
  Administered 2018-03-04 – 2018-03-07 (×4): 5 mg via ORAL
  Filled 2018-03-04 (×2): qty 1
  Filled 2018-03-04: qty 2
  Filled 2018-03-04 (×2): qty 1

## 2018-03-04 MED ORDER — CARVEDILOL 3.125 MG PO TABS
6.2500 mg | ORAL_TABLET | Freq: Every day | ORAL | Status: DC
  Administered 2018-03-06 – 2018-03-07 (×2): 6.25 mg via ORAL
  Filled 2018-03-04 (×3): qty 2

## 2018-03-04 MED ORDER — DIPHENOXYLATE-ATROPINE 2.5-0.025 MG PO TABS: 2.0000 | ORAL_TABLET | Freq: Four times a day (QID) | ORAL | Status: DC | PRN

## 2018-03-04 MED ORDER — PROPOFOL 500 MG/50ML IV EMUL
INTRAVENOUS | Status: DC | PRN
  Administered 2018-03-04: 75 ug/kg/min via INTRAVENOUS

## 2018-03-04 MED ORDER — BUPIVACAINE-EPINEPHRINE (PF) 0.25% -1:200000 IJ SOLN
INTRAMUSCULAR | Status: AC
  Filled 2018-03-04: qty 30

## 2018-03-04 MED ORDER — FAMOTIDINE 20 MG PO TABS
ORAL_TABLET | ORAL | Status: AC
  Administered 2018-03-04: 20 mg via ORAL
  Filled 2018-03-04: qty 1

## 2018-03-04 MED ORDER — OXYCODONE HCL 5 MG PO TABS
10.0000 mg | ORAL_TABLET | ORAL | Status: DC | PRN
  Administered 2018-03-05 – 2018-03-06 (×3): 10 mg via ORAL
  Filled 2018-03-04 (×2): qty 2

## 2018-03-04 MED ORDER — AMLODIPINE BESYLATE 5 MG PO TABS
5.0000 mg | ORAL_TABLET | Freq: Every day | ORAL | Status: DC
  Administered 2018-03-06: 5 mg via ORAL
  Filled 2018-03-04 (×3): qty 1

## 2018-03-04 MED ORDER — BISACODYL 10 MG RE SUPP
10.0000 mg | Freq: Every day | RECTAL | Status: DC | PRN
  Administered 2018-03-06: 10 mg via RECTAL
  Filled 2018-03-04: qty 1

## 2018-03-04 MED ORDER — METOCLOPRAMIDE HCL 10 MG PO TABS: 5.0000 mg | ORAL_TABLET | Freq: Three times a day (TID) | ORAL | Status: DC | PRN

## 2018-03-04 MED ORDER — HYDROMORPHONE HCL 1 MG/ML IJ SOLN
0.5000 mg | INTRAMUSCULAR | Status: DC | PRN
  Administered 2018-03-04: 0.5 mg via INTRAVENOUS
  Filled 2018-03-04: qty 1

## 2018-03-04 MED ORDER — PANCRELIPASE (LIP-PROT-AMYL) 12000-38000 UNITS PO CPEP
36000.0000 [IU] | ORAL_CAPSULE | Freq: Three times a day (TID) | ORAL | Status: DC
  Administered 2018-03-04 – 2018-03-07 (×8): 36000 [IU] via ORAL
  Filled 2018-03-04 (×11): qty 3

## 2018-03-04 MED ORDER — PHENOL 1.4 % MT LIQD: 1.0000 | OROMUCOSAL | Status: DC | PRN

## 2018-03-04 MED ORDER — LACTATED RINGERS IV SOLN
INTRAVENOUS | Status: DC
  Administered 2018-03-04: 07:00:00 via INTRAVENOUS

## 2018-03-04 MED ORDER — MEPERIDINE HCL 50 MG/ML IJ SOLN: 6.2500 mg | INTRAMUSCULAR | Status: DC | PRN

## 2018-03-04 MED ORDER — PROPOFOL 10 MG/ML IV BOLUS
INTRAVENOUS | Status: DC | PRN
  Administered 2018-03-04: 10 mg via INTRAVENOUS
  Administered 2018-03-04: 20 mg via INTRAVENOUS

## 2018-03-04 MED ORDER — NEOMYCIN-POLYMYXIN B GU 40-200000 IR SOLN
Status: DC | PRN
  Administered 2018-03-04: 4 mL

## 2018-03-04 MED ORDER — FENTANYL CITRATE (PF) 100 MCG/2ML IJ SOLN: 25.0000 ug | INTRAMUSCULAR | Status: DC | PRN

## 2018-03-04 MED ORDER — MENTHOL 3 MG MT LOZG: 1.0000 | LOZENGE | OROMUCOSAL | Status: DC | PRN

## 2018-03-04 MED ORDER — CLINDAMYCIN PHOSPHATE 900 MG/50ML IV SOLN
900.0000 mg | Freq: Four times a day (QID) | INTRAVENOUS | Status: AC
  Administered 2018-03-04 (×3): 900 mg via INTRAVENOUS
  Filled 2018-03-04 (×3): qty 50

## 2018-03-04 MED ORDER — ALUM & MAG HYDROXIDE-SIMETH 200-200-20 MG/5ML PO SUSP: 30.0000 mL | ORAL | Status: DC | PRN

## 2018-03-04 MED ORDER — CLINDAMYCIN PHOSPHATE 900 MG/50ML IV SOLN
INTRAVENOUS | Status: AC
  Filled 2018-03-04: qty 50

## 2018-03-04 MED ORDER — MAGNESIUM HYDROXIDE 400 MG/5ML PO SUSP
30.0000 mL | Freq: Every day | ORAL | Status: DC | PRN
  Administered 2018-03-05: 30 mL via ORAL
  Filled 2018-03-04 (×2): qty 30

## 2018-03-04 MED ORDER — ACETAMINOPHEN 500 MG PO TABS
1000.0000 mg | ORAL_TABLET | Freq: Four times a day (QID) | ORAL | Status: AC
  Administered 2018-03-04 – 2018-03-05 (×4): 1000 mg via ORAL
  Filled 2018-03-04 (×4): qty 2

## 2018-03-04 MED ORDER — ENOXAPARIN SODIUM 40 MG/0.4ML ~~LOC~~ SOLN
40.0000 mg | SUBCUTANEOUS | Status: DC
  Administered 2018-03-05 – 2018-03-07 (×3): 40 mg via SUBCUTANEOUS
  Filled 2018-03-04 (×3): qty 0.4

## 2018-03-04 MED ORDER — ALPRAZOLAM 0.5 MG PO TABS
0.5000 mg | ORAL_TABLET | Freq: Three times a day (TID) | ORAL | Status: DC | PRN
  Administered 2018-03-05: 0.5 mg via ORAL
  Administered 2018-03-06: 1 mg via ORAL
  Filled 2018-03-04: qty 1
  Filled 2018-03-04: qty 2

## 2018-03-04 MED ORDER — ACETAMINOPHEN 325 MG PO TABS: 325.0000 mg | ORAL_TABLET | Freq: Four times a day (QID) | ORAL | Status: DC | PRN

## 2018-03-04 MED ORDER — EPHEDRINE SULFATE 50 MG/ML IJ SOLN
INTRAMUSCULAR | Status: DC | PRN
  Administered 2018-03-04: 10 mg via INTRAVENOUS
  Administered 2018-03-04: 20 mg via INTRAVENOUS

## 2018-03-04 MED ORDER — CYCLOBENZAPRINE HCL 10 MG PO TABS
10.0000 mg | ORAL_TABLET | Freq: Every evening | ORAL | Status: DC | PRN
  Administered 2018-03-04: 10 mg via ORAL
  Filled 2018-03-04: qty 1

## 2018-03-04 MED ORDER — PROPOFOL 500 MG/50ML IV EMUL
INTRAVENOUS | Status: AC
  Filled 2018-03-04: qty 50

## 2018-03-04 MED ORDER — TRAMADOL HCL 50 MG PO TABS
50.0000 mg | ORAL_TABLET | Freq: Four times a day (QID) | ORAL | Status: DC
  Administered 2018-03-04 – 2018-03-07 (×13): 50 mg via ORAL
  Filled 2018-03-04 (×13): qty 1

## 2018-03-04 MED ORDER — METHOCARBAMOL 1000 MG/10ML IJ SOLN
500.0000 mg | Freq: Four times a day (QID) | INTRAVENOUS | Status: DC | PRN
  Filled 2018-03-04: qty 5

## 2018-03-04 MED ORDER — ONDANSETRON HCL 4 MG PO TABS: 4.0000 mg | ORAL_TABLET | Freq: Four times a day (QID) | ORAL | Status: DC | PRN

## 2018-03-04 SURGICAL SUPPLY — 57 items
BLADE SAW SAG 18.5X105 (BLADE) ×3 IMPLANT
BNDG COHESIVE 6X5 TAN STRL LF (GAUZE/BANDAGES/DRESSINGS) ×9 IMPLANT
CANISTER SUCT 1200ML W/VALVE (MISCELLANEOUS) ×3 IMPLANT
CHLORAPREP W/TINT 26ML (MISCELLANEOUS) ×3 IMPLANT
DRAPE C-ARM XRAY 36X54 (DRAPES) ×3 IMPLANT
DRAPE INCISE IOBAN 66X60 STRL (DRAPES) IMPLANT
DRAPE POUCH INSTRU U-SHP 10X18 (DRAPES) ×3 IMPLANT
DRAPE SHEET LG 3/4 BI-LAMINATE (DRAPES) ×9 IMPLANT
DRAPE TABLE BACK 80X90 (DRAPES) ×3 IMPLANT
DRESSING SURGICEL FIBRLLR 1X2 (HEMOSTASIS) ×2 IMPLANT
DRSG OPSITE POSTOP 4X8 (GAUZE/BANDAGES/DRESSINGS) IMPLANT
DRSG SURGICEL FIBRILLAR 1X2 (HEMOSTASIS) ×6
ELECT BLADE 6.5 EXT (BLADE) ×3 IMPLANT
ELECT REM PT RETURN 9FT ADLT (ELECTROSURGICAL) ×3
ELECTRODE REM PT RTRN 9FT ADLT (ELECTROSURGICAL) ×1 IMPLANT
GLOVE BIOGEL PI IND STRL 7.0 (GLOVE) ×5 IMPLANT
GLOVE BIOGEL PI IND STRL 9 (GLOVE) ×1 IMPLANT
GLOVE BIOGEL PI INDICATOR 7.0 (GLOVE) ×10
GLOVE BIOGEL PI INDICATOR 9 (GLOVE) ×2
GLOVE SURG SYN 9.0  PF PI (GLOVE) ×4
GLOVE SURG SYN 9.0 PF PI (GLOVE) ×2 IMPLANT
GOWN SRG 2XL LVL 4 RGLN SLV (GOWNS) ×1 IMPLANT
GOWN STRL NON-REIN 2XL LVL4 (GOWNS) ×2
GOWN STRL REUS W/ TWL LRG LVL3 (GOWN DISPOSABLE) ×1 IMPLANT
GOWN STRL REUS W/TWL LRG LVL3 (GOWN DISPOSABLE) ×2
HEMOVAC 400CC 10FR (MISCELLANEOUS) IMPLANT
HIP FEM HD S 28 (Head) ×3 IMPLANT
HOLDER FOLEY CATH W/STRAP (MISCELLANEOUS) ×3 IMPLANT
HOOD PEEL AWAY FLYTE STAYCOOL (MISCELLANEOUS) ×3 IMPLANT
KIT PREVENA INCISION MGT 13 (CANNISTER) ×3 IMPLANT
LINER DBL MOB SZ 0 52MM (Liner) ×3 IMPLANT
MAT BLUE FLOOR 46X72 FLO (MISCELLANEOUS) ×3 IMPLANT
NDL SAFETY ECLIPSE 18X1.5 (NEEDLE) ×1 IMPLANT
NEEDLE HYPO 18GX1.5 SHARP (NEEDLE) ×2
NEEDLE SPNL 18GX3.5 QUINCKE PK (NEEDLE) ×3 IMPLANT
NS IRRIG 1000ML POUR BTL (IV SOLUTION) ×3 IMPLANT
PACK HIP COMPR (MISCELLANEOUS) ×3 IMPLANT
SHELL ACETABULAR SZ 52 DM (Shell) ×3 IMPLANT
SOL PREP PVP 2OZ (MISCELLANEOUS) ×3
SOLUTION PREP PVP 2OZ (MISCELLANEOUS) ×1 IMPLANT
SPONGE DRAIN TRACH 4X4 STRL 2S (GAUZE/BANDAGES/DRESSINGS) IMPLANT
STAPLER SKIN PROX 35W (STAPLE) ×3 IMPLANT
STEM FEMORAL 4 STD COLLARED (Stem) ×3 IMPLANT
STRAP SAFETY 5IN WIDE (MISCELLANEOUS) ×3 IMPLANT
SUT DVC 2 QUILL PDO  T11 36X36 (SUTURE) ×2
SUT DVC 2 QUILL PDO T11 36X36 (SUTURE) ×1 IMPLANT
SUT SILK 0 (SUTURE) ×2
SUT SILK 0 30XBRD TIE 6 (SUTURE) ×1 IMPLANT
SUT V-LOC 90 ABS DVC 3-0 CL (SUTURE) ×3 IMPLANT
SUT VIC AB 1 CT1 36 (SUTURE) ×3 IMPLANT
SYR 20CC LL (SYRINGE) ×3 IMPLANT
SYR 30ML LL (SYRINGE) ×3 IMPLANT
SYR BULB IRRIG 60ML STRL (SYRINGE) ×3 IMPLANT
TAPE MICROFOAM 4IN (TAPE) ×3 IMPLANT
TOWEL OR 17X26 4PK STRL BLUE (TOWEL DISPOSABLE) ×3 IMPLANT
TRAY FOLEY W/METER SILVER 16FR (SET/KITS/TRAYS/PACK) ×3 IMPLANT
WND VAC CANISTER 500ML (MISCELLANEOUS) ×3 IMPLANT

## 2018-03-04 NOTE — Anesthesia Preprocedure Evaluation (Signed)
Anesthesia Evaluation  Patient identified by MRN, date of birth, ID band Patient awake    Reviewed: Allergy & Precautions, NPO status , Patient's Chart, lab work & pertinent test results  History of Anesthesia Complications (+) PROLONGED EMERGENCE and history of anesthetic complications  Airway Mallampati: III  TM Distance: >3 FB Neck ROM: Full    Dental  (+) Upper Dentures, Lower Dentures   Pulmonary asthma , former smoker,    breath sounds clear to auscultation- rhonchi (-) wheezing      Cardiovascular hypertension, Pt. on medications (-) CAD, (-) Past MI, (-) Cardiac Stents and (-) CABG  Rhythm:Regular Rate:Normal - Systolic murmurs and - Diastolic murmurs    Neuro/Psych Anxiety negative neurological ROS     GI/Hepatic Neg liver ROS, hiatal hernia,   Endo/Other  negative endocrine ROSneg diabetes  Renal/GU negative Renal ROS     Musculoskeletal  (+) Arthritis ,   Abdominal (+) + obese,   Peds  Hematology  (+) anemia ,   Anesthesia Other Findings Past Medical History: No date: Anxiety No date: Asthma No date: Complication of anesthesia     Comment:  difficulty waking up from anesthesia No date: GI bleed No date: Hiatal hernia No date: Hypertension No date: IDA (iron deficiency anemia)   Reproductive/Obstetrics                             Lab Results  Component Value Date   WBC 4.7 02/18/2018   HGB 13.0 02/18/2018   HCT 38.3 02/18/2018   MCV 86.4 02/18/2018   PLT 300 02/18/2018    Anesthesia Physical Anesthesia Plan  ASA: II  Anesthesia Plan: Spinal   Post-op Pain Management:    Induction:   PONV Risk Score and Plan: 2 and Propofol infusion  Airway Management Planned: Natural Airway  Additional Equipment:   Intra-op Plan:   Post-operative Plan:   Informed Consent: I have reviewed the patients History and Physical, chart, labs and discussed the procedure  including the risks, benefits and alternatives for the proposed anesthesia with the patient or authorized representative who has indicated his/her understanding and acceptance.   Dental advisory given  Plan Discussed with: CRNA and Anesthesiologist  Anesthesia Plan Comments:         Anesthesia Quick Evaluation

## 2018-03-04 NOTE — Clinical Social Work Placement (Addendum)
   CLINICAL SOCIAL WORK PLACEMENT  NOTE  Date:  03/04/2018  Patient Details  Name: Amanda [name] MRN: [id number] Date of Birth: 09/11/1941  Clinical Social Work is seeking post-discharge placement for this patient at the Skilled  Nursing Facility level of care (*CSW will initial, date and re-position this form in  chart as items are completed):  Yes   Patient/family provided with [place] Clinical Social Work Department's list of facilities offering this level of care within the geographic area requested by the patient (or if unable, by the patient's family).  Yes   Patient/family informed of their freedom to choose among providers that offer the needed level of care, that participate in Medicare, Medicaid or managed care program needed by the patient, have an available bed and are willing to accept the patient.      Patient/family informed of [facility]'s ownership interest [facility] and [facility], as well as of the fact that they are under no obligation to receive care at these facilities.  PASRR submitted to EDS on   PASRR number received on       Existing PASRR number confirmed on    03/04/18   FL2 transmitted to all facilities in geographic area requested by pt/family on 03/04/18 [phone]  FL2 transmitted to all facilities within larger geographic area on       Patient informed that his/her managed care company has contracts with or will negotiate with certain facilities, including the following:        Yes   Patient/family informed of bed offers received.  Patient [facility] bed at [facility] )     Physician recommends and patient chooses bed at      Patient to be transferred to   on  .  Patient to be transferred to facility by       Patient family notified on   of transfer.  Name of family member notified:        PHYSICIAN       Additional Comment:    _______________________________________________ Garrette Caine, Darleen CrockerBailey M, LCSW 03/04/2018,  2:34 PM

## 2018-03-04 NOTE — H&P (Signed)
Reviewed paper H+P, will be scanned into chart. No changes noted.  

## 2018-03-04 NOTE — Op Note (Signed)
03/04/2018  8:51 AM  PATIENT:  Amanda [name]  77 y.o. female  PRE-OPERATIVE DIAGNOSIS:  ACUTE OAIN OF RIGHT HIP  POST-OPERATIVE DIAGNOSIS:  ACUTE OAIN OF RIGHT HIP  PROCEDURE:  Procedure(s): TOTAL HIP ARTHROPLASTY ANTERIOR APPROACH (Right)  SURGEON: Leitha SchullerMichael J Ignazio Kincaid, MD  ASSISTANTS: none  ANESTHESIA:   spinal  EBL:  Total I/O In: -  Out: 800 [Urine:500; Blood:300]  BLOOD ADMINISTERED:none  DRAINS: none   LOCAL MEDICATIONS USED:  MARCAINE     SPECIMEN:  Source of Specimen:  Right femoral head  DISPOSITION OF SPECIMEN:  PATHOLOGY  COUNTS:  YES  TOURNIQUET:  * No tourniquets in log *  IMPLANTS: Medacta 4 standard AMIS stem with 52 mm Mpact DM cup with liner and S metal 28 mm head  DICTATION: .Dragon Dictation   The patient was brought to the operating room and after spinal anesthesia was obtained patient was placed on the operative table with the ipsilateral foot into the Medacta attachment, contralateral leg on a well-padded table. C-arm was brought in and preop template x-ray taken. After prepping and draping in usual sterile fashion appropriate patient identification and timeout procedures were completed. Anterior approach to the hip was obtained and centered over the greater trochanter and TFL muscle. The subcutaneous tissue was incised hemostasis being achieved by electrocautery. TFL fascia was incised and the muscle retracted laterally deep retractor placed. The lateral femoral circumflex vessels were identified and ligated. The anterior capsule was exposed and a capsulotomy performed. The neck was identified and a femoral neck cut carried out with a saw. The head was removed without difficulty and showed sclerotic femoral head and acetabulum. Reaming was carried out to 52 mm and a 52 mm cup trial gave appropriate tightness to the acetabular component a 52 DM cup was impacted into position. The leg was then externally rotated and ischiofemoral and patellofemoral releases  carried out. The femur was sequentially broached to a size 4, size 4 standard with S head trials were placed and the final components chosen. The 4 standard stem was inserted along with a metal S 28 mm head and 52 mm liner. The hip was reduced and was stable the wound was thoroughly irrigated with fibrillar placed on the posterior capsule and medial neck. The deep fascia was closed using a heavy Quill after infiltration of 30 cc of quarter percent Sensorcaine with epinephrine. 3-0 v-loc subcutaneously followed by skin staples and incisional wound VAC  PLAN OF CARE: Admit to inpatient    Stable on transfer to recovery room

## 2018-03-04 NOTE — Clinical Social Work Note (Addendum)
Clinical Social Work Assessment  Patient Details  Name: Amanda [name] MRN: [id number] Date of Birth: 13-Aug-1941  Date of referral:  03/04/18               Reason for consult:  Facility Placement                Permission sought to share information with:  Chartered certified accountant granted to share information::  Yes, Verbal Permission Granted  Name::      [place]::   [place]  Relationship::     Contact Information:     Housing/Transportation Living arrangements for the past 2 months:  [place] of Information:  Patient, Spouse Patient Interpreter Needed:  None Criminal Activity/Legal Involvement Pertinent to Current Situation/Hospitalization:  No - Comment as needed Significant Relationships:  Spouse Lives with:  Spouse Do you feel safe going back to the place where you live?  Yes Need for family participation in patient care:  Yes (Comment)  Care giving concerns:  Patient lives with husband Amanda [name] [phone]) in [place].   Social Worker assessment / plan: Holiday representative (CSW) received SNF consult. PT is pending. Patient had surgery today. Social work Theatre manager met with patient, husband Amanda [name], and family friend Amanda [name] at bedside. Patient was laying in bed alert and oriented x4. Social work Theatre manager introduced self and explained the role of the [place]. Patient shared she lives in [place] with husband Amanda [name]. Social work Theatre manager explained that PT is pending, PT will recommend home health or SNF placement, and [employer] will have to approve SNF placement. Patient and her husband verbalized their understanding and are interested in SNF placement. Patient and her husband prefer [facility]. FL2 completed and faxed out.   Social work Theatre manager presented bed offers to patient. Patient chose [facility]. [facility] does not currently have any beds available. Claiborne Billings, admission coordinator at [facility] is aware of accepted bed offer and will start [facility] SNF authorization. CSW and social work Theatre manager will continue to follow up and assist.   Employment status:  Retired Nurse, adult PT Recommendations:  Not assessed at this time Information / Referral to community resources:  [place]  Patient/Family's Response to care: Patient chose [facility], pending PT recommendations.  Patient/Family's Understanding of and Emotional Response to Diagnosis, Current Treatment, and Prognosis: Patient and her husband were both pleasant and thanked social work Theatre manager for her assistance.  Emotional Assessment Appearance:  Appears stated age Attitude/Demeanor/Rapport:    Affect (typically observed):  Calm, Accepting, Pleasant Orientation:  Oriented to Self, Oriented to Place, Oriented to  Time, Oriented to Situation Alcohol / Substance use:  Not Applicable Psych involvement (Current and /or in the community):  No (Comment)  Discharge Needs  Concerns to be addressed:  Discharge Planning Concerns, Care Coordination Readmission within the last 30 days:  No Current discharge risk:  Dependent with Mobility Barriers to Discharge:  Continued Medical Work up   Amanda [name], Student-Social Work 03/04/2018, 12:11 PM

## 2018-03-04 NOTE — Evaluation (Signed)
Physical Therapy Evaluation Patient Details Name: Amanda [name] MRN: [id number] DOB: 1941-09-04 Today's Date: 03/04/2018   History of Present Illness  77 y/o female s/p R total hip (anterior approach) 4/25, she had her L hip replaced ~9 months ago.   Clinical Impression  Pt hesitant to participate with PT but ultimately agreed that it'd be good for her to at least try a little today. She was pain limited with exercises but ultimately was able to participate with supine AAROM/lightly resisted acts.  She struggled with getting to standing and taking the few very small shuffling/turning steps to get to the recliner.  She felt nauseated with the effort and clearly was not very confident about the entire situation.  Pt went to rehab after her L total hip and likely will need the same again this time.      Follow Up Recommendations SNF    Equipment Recommendations  Rolling walker with 5" wheels    Recommendations for Other Services       Precautions / Restrictions Precautions Precautions: Anterior Hip;Fall Restrictions RLE Weight Bearing: Weight bearing as tolerated      Mobility  Bed Mobility Overal bed mobility: Needs Assistance Bed Mobility: Supine to Sit     Supine to sit: Min assist;Mod assist     General bed mobility comments: Pt showed good effort to get to sitting, but ultimately needed considerable assist to assume sitting position  Transfers Overall transfer level: Needs assistance Equipment used: Rolling walker (2 wheeled) Transfers: Sit to/from Stand Sit to Stand: Mod assist;Min assist         General transfer comment: Pt showed good effort but needed relatively heavy assist to get to standing  Ambulation/Gait Ambulation/Gait assistance: Min assist Ambulation Distance (Feet): 3 Feet Assistive device: Rolling walker (2 wheeled)       General Gait Details: Pt struggled to take just a few steps to get from bed to recliner. Showed good effort but was fearful,  nauseated and in pain t/o the effort.    Stairs            Wheelchair Mobility    Modified Rankin (Stroke Patients Only)       Balance Overall balance assessment: Needs assistance   Sitting balance-Leahy Scale: Good       Standing balance-Leahy Scale: Poor                               Pertinent Vitals/Pain Pain Assessment: 0-10 Pain Score: 6     Home Living Family/patient expects to be discharged to:: Skilled nursing facility Living Arrangements: Spouse/significant other               Additional Comments: Stairs in front of house have 6 steps with B railings.    Prior Function Level of Independence: Independent         Comments: Ambulated without an AD. Pt very active (gardens, walks, shops, etc.) and independent in all ADL's.     Hand Dominance        Extremity/Trunk Assessment   Upper Extremity Assessment Upper Extremity Assessment: Overall WFL for tasks assessed;Generalized weakness    Lower Extremity Assessment Lower Extremity Assessment: (expected post-op weakness)       Communication   Communication: No difficulties  Cognition Arousal/Alertness: Awake/alert Behavior During Therapy: Anxious Overall Cognitive Status: Within Functional Limits for tasks assessed  General Comments      Exercises Total Joint Exercises Ankle Circles/Pumps: Strengthening;10 reps Quad Sets: Strengthening;10 reps Gluteal Sets: Strengthening;10 reps Short Arc Quad: AROM;10 reps Heel Slides: AAROM;AROM;5 reps Hip ABduction/ADduction: AROM;5 reps   Assessment/Plan    PT Assessment Patient needs continued PT services  PT Problem List Decreased strength;Decreased range of motion;Decreased balance;Decreased mobility;Decreased activity tolerance;Decreased coordination;Decreased knowledge of use of DME;Decreased safety awareness;Decreased knowledge of precautions;Pain       PT Treatment  Interventions DME instruction;Gait training;Stair training;Functional mobility training;Therapeutic activities;Therapeutic exercise;Balance training;Neuromuscular re-education;Cognitive remediation;Patient/family education    PT Goals (Current goals can be found in the Care Plan section)  Acute Rehab PT Goals Patient Stated Goal: go to rehab to get stronger  PT Goal Formulation: With patient Time For Goal Achievement: 03/18/18 Potential to Achieve Goals: Fair    Frequency BID   Barriers to discharge        Co-evaluation               AM-PAC PT "6 Clicks" Daily Activity  Outcome Measure Difficulty turning over in bed (including adjusting bedclothes, sheets and blankets)?: Unable Difficulty moving from lying on back to sitting on the side of the bed? : Unable Difficulty sitting down on and standing up from a chair with arms (e.g., wheelchair, bedside commode, etc,.)?: Unable Help needed moving to and from a bed to chair (including a wheelchair)?: A Lot Help needed walking in hospital room?: Total Help needed climbing 3-5 steps with a railing? : Total 6 Click Score: 7    End of Session Equipment Utilized During Treatment: Gait belt Activity Tolerance: Patient limited by fatigue;Patient limited by pain(limited by nausea) Patient left: with chair alarm set;with call bell/phone within reach;with family/visitor present Nurse Communication: Mobility status PT Visit Diagnosis: Muscle weakness (generalized) (M62.81);Difficulty in walking, not elsewhere classified (R26.2)    Time: [id number] PT Time Calculation (min) (ACUTE ONLY): 31 min   Charges:   PT Evaluation $PT Eval Low Complexity: 1 Low PT Treatments $Therapeutic Exercise: 8-22 mins   PT G Codes:        Malachi ProGalen R Isador Castille, DPT 03/04/2018, 4:40 PM

## 2018-03-04 NOTE — [place] FL2 (Signed)
[place] MEDICAID FL2 LEVEL OF CARE SCREENING TOOL     IDENTIFICATION  Patient Name: Amanda [name] Birthdate: Jan 03, 1941 Sex: female Admission Date (Current Location): 03/04/2018  [place] and [place] Number:  Chiropodist and Address:  [facility], [address], [place], [place] [address]      Provider Number: [id number]  Attending Physician Name and Address:  Kennedy Bucker, MD  Relative Name and Phone Number:       Current Level of Care: Hospital Recommended Level of Care: Skilled Nursing Facility Prior Approval Number:    Date Approved/Denied:   PASRR Number:    Discharge Plan: SNF    Current Diagnoses: Patient Active Problem List   Diagnosis Date Noted  . 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 Primary localized osteoarthrosis of left hip 05/12/2017  . Diarrhea 01/09/2016  . 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 Absolute anemia 02/24/2015  . Acute bronchitis 02/24/2015  . Fracture of styloid process of radius 04/13/2014    Orientation RESPIRATION BLADDER Height & Weight     Self, Time, Situation, Place  Normal Continent Weight:   Height:     BEHAVIORAL SYMPTOMS/MOOD NEUROLOGICAL BOWEL NUTRITION STATUS      Continent Diet  AMBULATORY STATUS COMMUNICATION OF NEEDS Skin   Extensive Assist Verbally Surgical wounds(Incision Right Hip)                       Personal Care Assistance Level of Assistance  Bathing, Feeding, Dressing Bathing Assistance: Limited assistance Feeding assistance: Independent Dressing Assistance: Limited assistance     Functional Limitations Info  Sight, Hearing, Speech Sight Info: Adequate Hearing Info: Adequate Speech Info: Adequate    SPECIAL CARE FACTORS FREQUENCY  PT (By licensed PT), OT (By licensed OT)     PT Frequency: (5) OT Frequency: (5)            Contractures      Additional Factors Info  Code Status, Allergies Code Status Info: (Full Code) Allergies  Info: (OTHER, CODEINE, PENICILLINS )           Current Medications (03/04/2018):  This is the current hospital active medication list Current Facility-Administered Medications  Medication Dose Route Frequency Provider Last Rate Last Dose  . 0.9 %  sodium chloride infusion   Intravenous Continuous Kennedy Bucker, MD      . acetaminophen (TYLENOL) tablet 1,000 mg  1,000 mg Oral Q6H Kennedy Bucker, MD      . Melene Muller ON 03/05/2018] acetaminophen (TYLENOL) tablet 325-650 mg  325-650 mg Oral Q6H PRN Kennedy Bucker, MD      . albuterol (PROVENTIL HFA;VENTOLIN HFA) 108 (90 Base) MCG/ACT inhaler 1-2 puff  1-2 puff Inhalation Q6H PRN Kennedy Bucker, MD      . ALPRAZolam Prudy Feeler) tablet 0.5-1 mg  0.5-1 mg Oral TID PRN Kennedy Bucker, MD      . alum & mag hydroxide-simeth (MAALOX/MYLANTA) 200-200-20 MG/5ML suspension 30 mL  30 mL Oral Q4H PRN Kennedy Bucker, MD      . amLODipine (NORVASC) tablet 5 mg  5 mg Oral Daily Kennedy Bucker, MD      . bisacodyl (DULCOLAX) suppository 10 mg  10 mg Rectal Daily PRN Kennedy Bucker, MD      . carvedilol (COREG) tablet 6.25 mg  6.25 mg Oral Daily Kennedy Bucker, MD      . clindamycin (CLEOCIN) IVPB 900 mg  900 mg Intravenous Q6H Kennedy Bucker, MD      .  cyclobenzaprine (FLEXERIL) tablet 10 mg  10 mg Oral QHS PRN Kennedy BuckerMenz, Michael, MD      . diphenoxylate-atropine (LOMOTIL) 2.5-0.025 MG per tablet 2 tablet  2 tablet Oral QID PRN Kennedy BuckerMenz, Michael, MD      . docusate sodium (COLACE) capsule 100 mg  100 mg Oral BID Kennedy BuckerMenz, Michael, MD      . Melene Muller[START ON 03/05/2018] enoxaparin (LOVENOX) injection 40 mg  40 mg Subcutaneous Q24H Kennedy BuckerMenz, Michael, MD      . fentaNYL (SUBLIMAZE) injection 25-50 mcg  25-50 mcg Intravenous Q5 min PRN Penwarden, Amy, MD      . HYDROmorphone (DILAUDID) injection 0.5-1 mg  0.5-1 mg Intravenous Q4H PRN Kennedy BuckerMenz, Michael, MD      . lipase/protease/amylase (CREON) capsule 36,000 Units  36,000 Units Oral TID WC Kennedy BuckerMenz, Michael, MD      . magnesium citrate solution 1 Bottle  1 Bottle  Oral Once PRN Kennedy BuckerMenz, Michael, MD      . magnesium hydroxide (MILK OF MAGNESIA) suspension 30 mL  30 mL Oral Daily PRN Kennedy BuckerMenz, Michael, MD      . menthol-cetylpyridinium (CEPACOL) lozenge 3 mg  1 lozenge Oral PRN Kennedy BuckerMenz, Michael, MD       Or  . phenol (CHLORASEPTIC) mouth spray 1 spray  1 spray Mouth/Throat PRN Kennedy BuckerMenz, Michael, MD      . meperidine (DEMEROL) injection 6.25-12.5 mg  6.25-12.5 mg Intravenous Q5 min PRN Penwarden, Amy, MD      . methocarbamol (ROBAXIN) tablet 500 mg  500 mg Oral Q6H PRN Kennedy BuckerMenz, Michael, MD       Or  . methocarbamol (ROBAXIN) 500 mg in dextrose 5 % 50 mL IVPB  500 mg Intravenous Q6H PRN Kennedy BuckerMenz, Michael, MD      . metoCLOPramide (REGLAN) tablet 5-10 mg  5-10 mg Oral Q8H PRN Kennedy BuckerMenz, Michael, MD       Or  . metoCLOPramide (REGLAN) injection 5-10 mg  5-10 mg Intravenous Q8H PRN Kennedy BuckerMenz, Michael, MD      . ondansetron [facility](ZOFRAN) tablet 4 mg  4 mg Oral Q6H PRN Kennedy BuckerMenz, Michael, MD       Or  . ondansetron [facility](ZOFRAN) injection 4 mg  4 mg Intravenous Q6H PRN Kennedy BuckerMenz, Michael, MD      . oxyCODONE (Oxy IR/ROXICODONE) immediate release tablet 10-15 mg  10-15 mg Oral Q4H PRN Kennedy BuckerMenz, Michael, MD      . oxyCODONE (Oxy IR/ROXICODONE) immediate release tablet 5-10 mg  5-10 mg Oral Q4H PRN Kennedy BuckerMenz, Michael, MD      . promethazine (PHENERGAN) injection 6.25-12.5 mg  6.25-12.5 mg Intravenous Q15 min PRN Penwarden, Amy, MD      . traMADol (ULTRAM) tablet 50 mg  50 mg Oral Q6H Kennedy BuckerMenz, Michael, MD      . zolpidem (AMBIEN) tablet 5 mg  5 mg Oral QHS PRN Kennedy BuckerMenz, Michael, MD         Discharge Medications: Please see discharge summary for a list of discharge medications.  Relevant Imaging Results:  Relevant Lab Results:   Additional Information (SSN: [id number])  Payton SparkAnanda A Kyndal Heringer, Student-Social Work

## 2018-03-04 NOTE — Progress Notes (Signed)
Patient is being admitted to room 156 from OR. A&O x4, but sleepy.  Husband and sister at bedside. Foley, wound vac, TEDs and foot pumps at bedside. Oriented to room, call light, TV and bed controls. Bed alarm on for safety.

## 2018-03-04 NOTE — Anesthesia Procedure Notes (Signed)
Spinal  Patient location during procedure: OR Start time: 03/04/2018 7:15 AM End time: 03/04/2018 7:25 AM Staffing Anesthesiologist: , , MD Performed: anesthesiologist  Preanesthetic Checklist Completed: patient identified, site marked, surgical consent, pre-op evaluation, timeout performed, IV checked, risks and benefits discussed and monitors and equipment checked Spinal Block Patient position: sitting Prep: ChloraPrep Patient monitoring: heart rate, continuous pulse ox, blood pressure and cardiac monitor Approach: midline Location: L4-5 Injection technique: single-shot Needle Needle type: Introducer and Pencil-Tip  Needle gauge: 24 G Needle length: 9 cm Additional Notes Negative paresthesia. Negative blood return. Positive free-flowing CSF. Expiration date of kit checked and confirmed. Patient tolerated procedure well, without complications.       

## 2018-03-04 NOTE — Anesthesia Post-op Follow-up Note (Signed)
Anesthesia QCDR form completed.        

## 2018-03-04 NOTE — Transfer of Care (Signed)
Immediate Anesthesia Transfer of Care Note  Patient: Amanda SilvanKatie [name]  Procedure(s) Performed: TOTAL HIP ARTHROPLASTY ANTERIOR APPROACH (Right Hip)  Patient Location: PACU  Anesthesia Type:Spinal  Level of Consciousness: awake, alert  and oriented  Airway & Oxygen Therapy: Patient Spontanous Breathing  Post-op Assessment: Report given to RN  Post vital signs: Reviewed and stable  Last Vitals:  Vitals Value Taken Time  BP 99/66 03/04/2018  9:00 AM  Temp 37 C 03/04/2018  9:00 AM  Pulse 58 03/04/2018  9:00 AM  Resp 13 03/04/2018  9:00 AM  SpO2 100 % 03/04/2018  9:00 AM    Last Pain:  Vitals:   03/04/18 0857  TempSrc:   PainSc: (P) 0-No pain         Complications: No apparent anesthesia complications

## 2018-03-05 LAB — CBC
HCT: 32.3 % — ABNORMAL LOW (ref 35.0–47.0)
HEMOGLOBIN: 10.9 g/dL — AB (ref 12.0–16.0)
MCH: 29.1 pg (ref 26.0–34.0)
MCHC: 33.7 g/dL (ref 32.0–36.0)
MCV: 86.4 fL (ref 80.0–100.0)
PLATELETS: 182 10*3/uL (ref 150–440)
RBC: 3.73 MIL/uL — AB (ref 3.80–5.20)
RDW: 14.3 % (ref 11.5–14.5)
WBC: 6.1 10*3/uL (ref 3.6–11.0)

## 2018-03-05 LAB — BASIC METABOLIC PANEL
ANION GAP: 4 — AB (ref 5–15)
BUN: 18 mg/dL (ref 6–20)
CALCIUM: 8.3 mg/dL — AB (ref 8.9–10.3)
CO2: 27 mmol/L (ref 22–32)
CREATININE: 0.76 mg/dL (ref 0.44–1.00)
Chloride: 106 mmol/L (ref 101–111)
Glucose, Bld: 114 mg/dL — ABNORMAL HIGH (ref 65–99)
Potassium: 4.5 mmol/L (ref 3.5–5.1)
SODIUM: 137 mmol/L (ref 135–145)

## 2018-03-05 LAB — TYPE AND SCREEN
ABO/RH(D): O POS
ANTIBODY SCREEN: POSITIVE
UNIT DIVISION: 0
Unit division: 0

## 2018-03-05 LAB — BPAM RBC
BLOOD PRODUCT EXPIRATION DATE: 201905212359
Blood Product Expiration Date: 201905212359
Unit Type and Rh: 5100
Unit Type and Rh: 5100

## 2018-03-05 MED ORDER — SODIUM CHLORIDE 0.9 % IV BOLUS
500.0000 mL | Freq: Once | INTRAVENOUS | Status: AC
  Administered 2018-03-05: 500 mL via INTRAVENOUS

## 2018-03-05 MED ORDER — ENOXAPARIN SODIUM 40 MG/0.4ML ~~LOC~~ SOLN: 40.0000 mg | SUBCUTANEOUS | 0 refills | Status: DC

## 2018-03-05 MED ORDER — OXYCODONE HCL 5 MG PO TABS: 5.0000 mg | ORAL_TABLET | ORAL | 0 refills | Status: DC | PRN

## 2018-03-05 MED ORDER — ACETAMINOPHEN 325 MG PO TABS: 325.0000 mg | ORAL_TABLET | Freq: Four times a day (QID) | ORAL | Status: DC | PRN

## 2018-03-05 NOTE — Progress Notes (Signed)
   Subjective: 1 Day Post-Op Procedure(s) (LRB): TOTAL HIP ARTHROPLASTY ANTERIOR APPROACH (Right) Patient reports pain as 6 on 0-10 scale.   Patient is well, and has had no acute complaints or problems Denies any CP, SOB, ABD pain. We will continue therapy today.  Plan is to go Skilled nursing facility after hospital stay.  Objective: Vital signs in last 24 hours: Temp:  [97.5 F (36.4 C)-98.6 F (37 C)] 97.9 F (36.6 C) (04/26 0520) Pulse Rate:  [56-79] 75 (04/26 0520) Resp:  [12-18] 17 (04/26 0520) BP: (90-129)/(53-69) 96/55 (04/26 0520) SpO2:  [91 %-100 %] 92 % (04/26 0520) Weight:  [85.4 kg (188 lb 3.2 oz)] 85.4 kg (188 lb 3.2 oz) (04/25 1040)  Intake/Output from previous day: 04/25 0701 - 04/26 0700 In: 3119.8 [P.O.:580; I.V.:2439.8; IV Piggyback:100] Out: 2150 [Urine:1850; Blood:300] Intake/Output this shift: No intake/output data recorded.  Recent Labs    03/04/18 1041 03/05/18 0530  HGB 12.2 10.9*   Recent Labs    03/04/18 1041 03/05/18 0530  WBC 5.2 6.1  RBC 4.14 3.73*  HCT 35.5 32.3*  PLT 189 182   Recent Labs    03/04/18 1041 03/05/18 0530  NA  --  137  K  --  4.5  CL  --  106  CO2  --  27  BUN  --  18  CREATININE 0.81 0.76  GLUCOSE  --  114*  CALCIUM  --  8.3*   No results for input(s): LABPT, INR in the last 72 hours.  EXAM General - Patient is Alert, Appropriate and Oriented Extremity - Neurovascular intact Sensation intact distally Intact pulses distally Dorsiflexion/Plantar flexion intact No cellulitis present Compartment soft Dressing - dressing C/D/I and no drainage. Wound vac intact with out drainage Motor Function - intact, moving foot and toes well on exam.   Past Medical History:  Diagnosis Date  . Anxiety   . Asthma   . Complication of anesthesia    difficulty waking up from anesthesia  . GI bleed   . Hiatal hernia   . Hypertension   . IDA (iron deficiency anemia)     Assessment/Plan:   1 Day Post-Op  Procedure(s) (LRB): TOTAL HIP ARTHROPLASTY ANTERIOR APPROACH (Right) Active Problems:   Status post total hip replacement, right  Estimated body mass index is 34.42 kg/m as calculated from the following:   Height as of this encounter: 5\' 2"  (1.575 m).   Weight as of this encounter: 85.4 kg (188 lb 3.2 oz). Advance diet Up with therapy  Needs BM Acute post op blood loss anemia - Hgb stable at 10.9. Will recheck labs in the am Continue to monitor BP Plan on discharge to SNF  Please remove provena negative pressure dressing on 03/13/2018 and apply honey comb dressing. Keep dressing clean and dry at all times. TED hose x 6 weeks. Remove at night    DVT Prophylaxis - Lovenox, Foot Pumps and TED hose Weight-Bearing as tolerated to right leg   T. Cranston Neighborhris Briley Sulton, PA-C [facility] 03/05/2018, 7:59 AM

## 2018-03-05 NOTE — Progress Notes (Signed)
Physical Therapy Treatment Patient Details Name: Amanda [name] MRN: [id number] DOB: 18-Dec-1940 Today's Date: 03/05/2018    History of Present Illness 77 y/o female s/p R total hip (anterior approach) 4/25, she had her L hip replaced ~9 months ago.     PT Comments    Pt continues to be very pain limited and struggles with most aspects of PT session.  She was able to increase ambulation distance to ~25 ft with great effort and very slow, labored, painful ambulation.  She showed good effort with supine exercise and mobility but again pain (and some anxiety) appear to be consistent limiters and require frequent rest breaks, encouragement.  Pt making slow gains but is doing more POD1 than L hip last year.   Follow Up Recommendations  SNF     Equipment Recommendations       Recommendations for Other Services       Precautions / Restrictions Precautions Precautions: Anterior Hip;Fall Restrictions RLE Weight Bearing: Weight bearing as tolerated    Mobility  Bed Mobility Overal bed mobility: Needs Assistance Bed Mobility: Supine to Sit;Sit to Supine     Supine to sit: Mod assist     General bed mobility comments: Pt continues to be quite pain limited with mobility  Transfers Overall transfer level: Needs assistance Equipment used: Rolling walker (2 wheeled) Transfers: Sit to/from Stand Sit to Stand: Mod assist         General transfer comment: Pt continues to need considerable assist to assume standing at EOB  Ambulation/Gait Ambulation/Gait assistance: Min assist Ambulation Distance (Feet): 25 Feet Assistive device: Rolling walker (2 wheeled)       General Gait Details: Pt c/o pain with each step, becomes tired and generally struggled the entire bout of 25 ft of ambulation with heavy walker reliance and constant cuing   Stairs             Wheelchair Mobility    Modified Rankin (Stroke Patients Only)       Balance Overall balance assessment: Needs  assistance Sitting-balance support: Bilateral upper extremity supported Sitting balance-Leahy Scale: Good       Standing balance-Leahy Scale: Poor Standing balance comment: reliant on walker, not confident with R WBing                            Cognition Arousal/Alertness: Awake/alert Behavior During Therapy: Anxious;Restless Overall Cognitive Status: Within Functional Limits for tasks assessed                                        Exercises Total Joint Exercises Ankle Circles/Pumps: Strengthening;10 reps Quad Sets: Strengthening;10 reps Gluteal Sets: Strengthening;10 reps Short Arc Quad: AROM;10 reps Heel Slides: AAROM;AROM;5 reps Hip ABduction/ADduction: AROM;10 reps    General Comments        Pertinent Vitals/Pain Pain Score: 9     Home Living Family/patient expects to be discharged to:: Skilled nursing facility                    Prior Function            PT Goals (current goals can now be found in the care plan section) Progress towards PT goals: Progressing toward goals    Frequency    BID      PT Plan Current plan remains appropriate    Co-evaluation  AM-PAC PT "6 Clicks" Daily Activity  Outcome Measure  Difficulty turning over in bed (including adjusting bedclothes, sheets and blankets)?: Unable Difficulty moving from lying on back to sitting on the side of the bed? : Unable Difficulty sitting down on and standing up from a chair with arms (e.g., wheelchair, bedside commode, etc,.)?: Unable Help needed moving to and from a bed to chair (including a wheelchair)?: A Lot Help needed walking in hospital room?: A Lot Help needed climbing 3-5 steps with a railing? : Total 6 Click Score: 8    End of Session Equipment Utilized During Treatment: Gait belt Activity Tolerance: Patient limited by fatigue;Patient limited by pain Patient left: with bed alarm set;with call bell/phone within reach;with  family/visitor present   PT Visit Diagnosis: Muscle weakness (generalized) (M62.81);Difficulty in walking, not elsewhere classified (R26.2)     Time: 1420-1500 PT Time Calculation (min) (ACUTE ONLY): 40 min  Charges:  $Gait Training: 8-22 mins $Therapeutic Exercise: 8-22 mins $Therapeutic Activity: 8-22 mins                    G Codes:       Amanda ProGalen R Amarria [name], DPT 03/05/2018, 4:19 PM

## 2018-03-05 NOTE — Discharge Summary (Addendum)
Physician Discharge Summary  Patient ID: Amanda [name] MRN: [id number] DOB/AGE: 79/06/1941 79 y.o.  Admit date: 03/04/2018 Discharge date: 03/07/2018 Admission Diagnoses:  Acute pain right hip.  Osteoarthritis.  Discharge Diagnoses: Patient Active Problem List   Diagnosis Date Noted  . 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 Primary localized osteoarthrosis of left hip 05/12/2017  . Diarrhea 01/09/2016  . 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 Absolute anemia 02/24/2015  . Acute bronchitis 02/24/2015  . Fracture of styloid process of radius 04/13/2014    Past Medical History:  Diagnosis Date  . Anxiety   . Asthma   . Complication of anesthesia    difficulty waking up from anesthesia  . GI bleed   . Hiatal hernia   . Hypertension   . IDA (iron deficiency anemia)      Transfusion: none   Consultants (if any):   Discharged Condition: Improved  Hospital Course: Amanda SilvanKatie Massey is an 77 y.o. female who was admitted 03/04/2018 with a diagnosis of <principal problem not specified> and went to the operating room on 03/04/2018 and underwent the above named procedures.    Surgeries: Procedure(s): TOTAL HIP ARTHROPLASTY ANTERIOR APPROACH on 03/04/2018 Patient tolerated the surgery well. Taken to PACU where she was stabilized and then transferred to the orthopedic floor.  Started on Lovenox 40 mg q 24 hrs. Foot pumps applied bilaterally at 80 mm. Heels elevated on bed with rolled towels. No evidence of DVT. Negative Homan. Physical therapy started on day #1 for gait training and transfer. OT started day #1 for ADL and assisted devices. The patient had a bowel movement.  The patient was ambulated 25 feet with physical therapy on postop day 1 and 5 feet on postop day 2.  She did have decreased oxygen saturation to 81% which improved with oxygen nasal cannula.  She is normal with her oxygen saturation at this time.  Her pain control was improved.    Implants: Medacta 4 standard  AMIS stem with 52 mm Mpact DM cup with liner and S metal 28 mm head  Please remove provena negative pressure dressing on 03/13/2018 and apply honey comb dressing. Keep dressing clean and dry at all times.   She was given perioperative antibiotics:  Anti-infectives (From admission, onward)   Start     Dose/Rate Route Frequency Ordered Stop   03/04/18 1030  clindamycin (CLEOCIN) IVPB 900 mg     900 mg 100 mL/hr over 30 Minutes Intravenous Every 6 hours 03/04/18 1026 03/04/18 2212   03/04/18 0603  clindamycin (CLEOCIN) 900 MG/50ML IVPB    Note to Pharmacy:  Amanda [name], Amanda [name]   : cabinet override      03/04/18 0603 03/04/18 0740   03/03/18 2230  clindamycin (CLEOCIN) IVPB 900 mg     900 mg 100 mL/hr over 30 Minutes Intravenous  Once 03/03/18 2229 03/04/18 0740    .  She was given sequential compression devices, early ambulation, and Lovenox for DVT prophylaxis.  She benefited maximally from the hospital stay and there were no complications.    Recent vital signs:  Vitals:   03/07/18 0623 03/07/18 0624  BP:    Pulse:    Resp:    Temp:    SpO2: (!) 88% 94%    Recent laboratory studies:  Lab Results  Component Value Date   HGB 10.9 (L) 03/05/2018   HGB 12.2 03/04/2018   HGB 13.0 02/18/2018   Lab Results  Component Value Date   WBC 6.1 03/05/2018  PLT 182 03/05/2018   Lab Results  Component Value Date   INR 0.95 02/18/2018   Lab Results  Component Value Date   NA 137 03/05/2018   K 4.5 03/05/2018   CL 106 03/05/2018   CO2 27 03/05/2018   BUN 18 03/05/2018   CREATININE 0.76 03/05/2018   GLUCOSE 114 (H) 03/05/2018    Discharge Medications:   Allergies as of 03/07/2018      Reactions   Other Other (See Comments)   general anesthesia=difficulty waking   Codeine Nausea And Vomiting   Penicillins Hives   Has patient had a PCN reaction causing immediate rash, facial/tongue/throat swelling, SOB or lightheadedness with hypotension: Yes Has patient had a PCN reaction  causing severe rash involving mucus membranes or skin necrosis: Unknown Has patient had a PCN reaction that required hospitalization: No Has patient had a PCN reaction occurring within the last 10 years: No If all of the above answers are "NO", then may proceed with Cephalosporin use.      Medication List    STOP taking these medications   HYDROcodone-acetaminophen 10-325 MG tablet Commonly known as:  NORCO     TAKE these medications   acetaminophen 325 MG tablet Commonly known as:  TYLENOL Take 1-2 tablets (325-650 mg total) by mouth every 6 (six) hours as needed for mild pain (pain score 1-3 or temp > 100.5).   albuterol 108 (90 Base) MCG/ACT inhaler Commonly known as:  PROVENTIL HFA;VENTOLIN HFA Inhale 1-2 puffs into the lungs every 6 (six) hours as needed for wheezing or shortness of breath.   ALPRAZolam 1 MG tablet Commonly known as:  XANAX Take 0.5-1 mg by mouth 3 (three) times daily as needed for anxiety.   amLODipine 5 MG tablet Commonly known as:  NORVASC Take 5 mg by mouth daily.   B-12 PO Take 1 capsule by mouth daily.   carvedilol 6.25 MG tablet Commonly known as:  COREG Take 6.25 mg by mouth daily.   CREON 36000 UNITS Cpep capsule Generic drug:  lipase/protease/amylase Take 36,000 Units by mouth 3 (three) times daily with meals.   cyclobenzaprine 10 MG tablet Commonly known as:  FLEXERIL Take 10 mg by mouth at bedtime as needed for muscle spasms.   diphenoxylate-atropine 2.5-0.025 MG tablet Commonly known as:  LOMOTIL Take 2 tablets by mouth 4 (four) times daily as needed for diarrhea or loose stools.   enoxaparin 40 MG/0.4ML injection Commonly known as:  LOVENOX Inject 0.4 mLs (40 mg total) into the skin daily for 14 days.   oxyCODONE 5 MG immediate release tablet Commonly known as:  Oxy IR/ROXICODONE Take 1-2 tablets (5-10 mg total) by mouth every 4 (four) hours as needed for moderate pain (pain score 4-6).       Diagnostic Studies: Dg Chest  Port 1 View  Result Date: 03/06/2018 CLINICAL DATA:  Hypoxia. EXAM: PORTABLE CHEST 1 VIEW COMPARISON:  04/12/2014 FINDINGS: Chronic cardiomegaly. A hiatal hernia was seen previously. There is linear opacities at the bases without generalized Kerley lines or air bronchogram. No effusion or pneumothorax. IMPRESSION: Mild atelectatic type opacities. Electronically Signed   By: Marnee Spring M.D.   On: 03/06/2018 13:11   Dg Hip Operative Unilat W Or W/o Pelvis Right  Result Date: 03/04/2018 CLINICAL DATA:  Total right hip replacement. EXAM: DG HIP (WITH OR WITHOUT PELVIS) 2-3V RIGHT; OPERATIVE RIGHT HIP WITH PELVIS COMPARISON:  11/21/2016. FINDINGS: Total right hip replacement with anatomic alignment. Hardware intact. No acute bony abnormality. IMPRESSION: Total  right hip replacement with anatomic alignment. Electronically Signed   By: Maisie Fus  Register   On: 03/04/2018 10:01   Dg Hip Unilat W Or W/o Pelvis 2-3 Views Right  Result Date: 03/04/2018 CLINICAL DATA:  Total right hip replacement. EXAM: DG HIP (WITH OR WITHOUT PELVIS) 2-3V RIGHT; OPERATIVE RIGHT HIP WITH PELVIS COMPARISON:  11/21/2016. FINDINGS: Total right hip replacement with anatomic alignment. Hardware intact. No acute bony abnormality. IMPRESSION: Total right hip replacement with anatomic alignment. Electronically Signed   By: Maisie Fus  Register   On: 03/04/2018 10:01    Disposition:      Contact information for follow-up providers    Evon Slack, PA-C Follow up in 2 week(s).   Specialties:  Orthopedic Surgery, Emergency Medicine Contact information: [address] [place] [address] [phone]            Contact information for after-discharge care    Destination    [facility] .   Service:  Skilled Nursing Contact information: [address] [place] [address] [phone]                   Signed: Lenard Forth, Cloys Vera 03/07/2018, 7:11 AM

## 2018-03-05 NOTE — Progress Notes (Signed)
Physical Therapy Treatment Patient Details Name: Amanda [name] MRN: [id number] DOB: 01/21/1941 Today's Date: 03/05/2018    History of Present Illness 77 y/o female s/p R total hip (anterior approach) 4/25, she had her L hip replaced ~9 months ago.     PT Comments    Pt with general pain/discomfort t/o the entire session.  She was willing to participate but needed a lot of encouragement and cuing to stay motivated.  She struggled with all exercises and needed some AAROM with most acts though she was able to do a few reps of resisted activity here and there.  Pt initially refused to try walking this session (had been in the chair earlier today) but finally did agree that she needed to work on this and was able to go ~12 ft with walker and slow/labored gait.     Follow Up Recommendations  SNF     Equipment Recommendations       Recommendations for Other Services       Precautions / Restrictions Precautions Precautions: Anterior Hip;Fall Restrictions Weight Bearing Restrictions: Yes RLE Weight Bearing: Weight bearing as tolerated    Mobility  Bed Mobility Overal bed mobility: Needs Assistance Bed Mobility: Supine to Sit;Sit to Supine     Supine to sit: Min assist;Mod assist Sit to supine: Mod assist;Max assist   General bed mobility comments: Pt showed good effort, but struggled with mobility needing considerable assist in/out of bed  Transfers Overall transfer level: Needs assistance Equipment used: Rolling walker (2 wheeled) Transfers: Sit to/from Stand Sit to Stand: Mod assist         General transfer comment: Pt showed good effort but struggled to initiate movement upward and generally needed heavy assist to rise from standard height bed  Ambulation/Gait Ambulation/Gait assistance: Min assist Ambulation Distance (Feet): 12 Feet Assistive device: Rolling walker (2 wheeled)       General Gait Details: Pt groaning in pain/discomfort t/o the effort.  She needed a  lot of encouragement and cuing to even do as much as she did this session.  Pt very fatigued after the bout and generally lacked safety and confidence with c/o increased pain.   Stairs             Wheelchair Mobility    Modified Rankin (Stroke Patients Only)       Balance Overall balance assessment: Needs assistance Sitting-balance support: Bilateral upper extremity supported Sitting balance-Leahy Scale: Good       Standing balance-Leahy Scale: Poor Standing balance comment: reliant on walker, not confident with R WBing                            Cognition Arousal/Alertness: Awake/alert Behavior During Therapy: Anxious;Restless Overall Cognitive Status: Within Functional Limits for tasks assessed                                        Exercises Total Joint Exercises Ankle Circles/Pumps: Strengthening;10 reps Quad Sets: Strengthening;10 reps Gluteal Sets: Strengthening;10 reps Short Arc Quad: AROM;10 reps Heel Slides: AAROM;AROM;5 reps Hip ABduction/ADduction: AROM;10 reps    General Comments        Pertinent Vitals/Pain Pain Assessment: 0-10 Pain Score: 9  Pain Location: Right Hip Pain Descriptors / Indicators: Aching;Sore Pain Intervention(s): Limited activity within patient's tolerance;Monitored during session    Home Living Family/patient expects to be discharged to::  Skilled nursing facility Living Arrangements: Spouse/significant other Available Help at Discharge: Family;Available 24 hours/day Type of Home: House Home Access: Stairs to enter   Home Layout: One level   Additional Comments: Stairs in front of house have 8 steps with railings on the right, and left, 5 steps in the back with railings.    Prior Function Level of Independence: Independent      Comments: Independent with ADLs, IADLs, meal preparation, medication management, driving, walking, gardening,, and shopping.   PT Goals (current goals can now be  found in the care plan section) Acute Rehab PT Goals Patient Stated Goal: To return home Progress towards PT goals: Progressing toward goals    Frequency    BID      PT Plan Current plan remains appropriate    Co-evaluation              AM-PAC PT "6 Clicks" Daily Activity  Outcome Measure  Difficulty turning over in bed (including adjusting bedclothes, sheets and blankets)?: Unable Difficulty moving from lying on back to sitting on the side of the bed? : Unable Difficulty sitting down on and standing up from a chair with arms (e.g., wheelchair, bedside commode, etc,.)?: Unable Help needed moving to and from a bed to chair (including a wheelchair)?: A Lot Help needed walking in hospital room?: A Lot Help needed climbing 3-5 steps with a railing? : Total 6 Click Score: 8    End of Session Equipment Utilized During Treatment: Gait belt Activity Tolerance: Patient limited by fatigue;Patient limited by pain Patient left: with bed alarm set;with call bell/phone within reach;with family/visitor present   PT Visit Diagnosis: Muscle weakness (generalized) (M62.81);Difficulty in walking, not elsewhere classified (R26.2)     Time: 8295-6213 PT Time Calculation (min) (ACUTE ONLY): 42 min  Charges:  $Gait Training: 8-22 mins $Therapeutic Exercise: 8-22 mins $Therapeutic Activity: 8-22 mins                    G Codes:       Malachi Pro, DPT 03/05/2018, 1:02 PM

## 2018-03-05 NOTE — Anesthesia Postprocedure Evaluation (Signed)
Anesthesia Post Note  Patient: Sheldon SilvanKatie Weick  Procedure(s) Performed: TOTAL HIP ARTHROPLASTY ANTERIOR APPROACH (Right Hip)  Patient location during evaluation: Nursing Unit Anesthesia Type: Spinal Level of consciousness: awake, awake and alert and oriented Pain management: pain level controlled Vital Signs Assessment: post-procedure vital signs reviewed and stable Respiratory status: spontaneous breathing Cardiovascular status: blood pressure returned to baseline Postop Assessment: no headache, no backache, no apparent nausea or vomiting and adequate PO intake Anesthetic complications: no     Last Vitals:  Vitals:   03/05/18 0040 03/05/18 0520  BP: (!) 129/53 (!) 96/55  Pulse: 79 75  Resp: 18 17  Temp: 36.7 C 36.6 C  SpO2: 95% 92%    Last Pain:  Vitals:   03/05/18 0551  TempSrc:   PainSc: 4                  Anderia Lorenzo Lawerance CruelStarr

## 2018-03-05 NOTE — Progress Notes (Signed)
[facility] authorization has been received. Plan is for patient to D/C to [facility] tomorrow pending medical clearance. Clinical Child psychotherapistocial Worker (CSW) sent D/C summary to [facility] today via [employer]. Patient is aware of above. Patient's husband Jonny RuizJohn is aware of above.  Baker Hughes IncorporatedBailey Tomeshia Pizzi, LCSW [phone]

## 2018-03-05 NOTE — Progress Notes (Signed)
Occupational Therapy Evaluation Patient Details Name: Amanda SilvanKatie [name] MRN: [id number] DOB: 09/24/1941 Today's Date: 03/05/2018    History of Present Illness Pt. is a 77 y.o. female who was admitted to [facility] for and anterior approach RTHR.  Pt. PMHx includes: LTHR approximately 9 months ago, Bergmann's Syndrome, Acute Bronchitis, Fracture of the Styloid Process of the Radius, Anxiety, Asthnma, GI Bleed, Hiatal Hernia, HTN.   Clinical Impression   Pt. Presents with 7/10 pain, weakness, and limited functional mobility which limits her ability to complete basic ADL and IADL functioning. Pt. Resides at home with her husband. Pt. Was independent with ADLs, and IADL functioning: including meal preparation, medication management, and driving. Pt. Was an active with gardening, walking, and shopping. Pt. education was provided with education about A/E use for LE ADLs. Pt. Is familiar with A/E following a recent Left THR. Pt. Reports having a transfer tub bench, BSCommode, walker, Reacher, and sockaide. Pt. Could benefit from OT services for ADL training, A/E training, and pt. education about home modification, and DME. Pt. Would beneift from SNF level of care upon discharge.    Follow Up Recommendations  SNF    Equipment Recommendations       Recommendations for Other Services Rehab consult     Precautions / Restrictions Restrictions Weight Bearing Restrictions: Yes RLE Weight Bearing: Weight bearing as tolerated      Mobility Bed Mobility    Pt. Up in chair upon arrival.              Transfers Overall transfer level: Needs assistance Equipment used: Rolling walker (2 wheeled) Transfers: Sit to/from Stand Sit to Stand: Mod assist;Min assist         General transfer comment: Mobility per PT report.    Balance Overall balance assessment: Needs assistance   Sitting balance-Leahy Scale: Good                                     ADL either performed or assessed  with clinical judgement   ADL Overall ADL's : Needs assistance/impaired Eating/Feeding: Set up;Independent   Grooming: Independent;Set up   Upper Body Bathing: Set up;Independent   Lower Body Bathing: Set up;Maximal assistance   Upper Body Dressing : Set up;Independent   Lower Body Dressing: Set up;Maximal assistance                 General ADL Comments: Pt. education was provided about A/E use for LE ADLs. Pt. reports being familiar with the A/E from recent left hip surgery.     Vision Baseline Vision/History: No visual deficits;Wears glasses Wears Glasses: Reading only       Perception     Praxis      Pertinent Vitals/Pain Pain Assessment: 0-10 Pain Score: 7  Pain Location: Right Hip Pain Descriptors / Indicators: Aching;Sore Pain Intervention(s): Limited activity within patient's tolerance;Monitored during session     Hand Dominance     Extremity/Trunk Assessment Upper Extremity Assessment Upper Extremity Assessment: Overall WFL for tasks assessed           Communication Communication Communication: No difficulties   Cognition Arousal/Alertness: Awake/alert Behavior During Therapy: WFL for tasks assessed/performed Overall Cognitive Status: Within Functional Limits for tasks assessed  General Comments       Exercises     Shoulder Instructions      Home Living Family/patient expects to be discharged to:: Skilled nursing facility Living Arrangements: Spouse/significant other Available Help at Discharge: Family;Available 24 hours/day Type of Home: House Home Access: Stairs to enter     Home Layout: One level     Bathroom Shower/Tub: Tub/shower unit;Curtain(The has a transfer Tub bench)             Additional Comments: Stairs in front of house have 8 steps with railings on the right, and left, 5 steps in the back with railings.      Prior Functioning/Environment Level of  Independence: Independent        Comments: Independent with ADLs, IADLs, meal preparation, medication management, driving, walking, gardening,, and shopping.        OT Problem List: Decreased strength;Decreased knowledge of use of DME or AE;Decreased activity tolerance;Pain;Decreased coordination      OT Treatment/Interventions: Self-care/ADL training;Therapeutic exercise;Patient/family education;Therapeutic activities    OT Goals(Current goals can be found in the care plan section) Acute Rehab OT Goals Patient Stated Goal: To return home OT Goal Formulation: With patient Potential to Achieve Goals: Good  OT Frequency: Min 1X/week   Barriers to D/C:            Co-evaluation              AM-PAC PT "6 Clicks" Daily Activity     Outcome Measure Help from another person eating meals?: None Help from another person taking care of personal grooming?: None Help from another person toileting, which includes using toliet, bedpan, or urinal?: A Little Help from another person bathing (including washing, rinsing, drying)?: A Lot Help from another person to put on and taking off regular upper body clothing?: None Help from another person to put on and taking off regular lower body clothing?: A Lot 6 Click Score: 19   End of Session Equipment Utilized During Treatment: Gait belt  Activity Tolerance: Patient tolerated treatment well Patient left: in bed  OT Visit Diagnosis: Muscle weakness (generalized) (M62.81);Pain Pain - Right/Left: Right Pain - part of body: Hip                Time: 4098-1191 OT Time Calculation (min): 20 min Charges:  OT General Charges $OT Visit: 1 Visit OT Evaluation $OT Eval Low Complexity: 1 Low G-Codes:     Olegario Messier, MS, OTR/L   Olegario Messier, MS, OTR/L 03/05/2018, 11:30 AM

## 2018-03-05 NOTE — Discharge Instructions (Signed)

## 2018-03-06 ENCOUNTER — Inpatient Hospital Stay: Payer: Medicare Other

## 2018-03-06 NOTE — Progress Notes (Addendum)
   Subjective: 2 Days Post-Op Procedure(s) (LRB): TOTAL HIP ARTHROPLASTY ANTERIOR APPROACH (Right) Patient reports pain as moderate to severe. Patient is well, and has had no acute complaints or problems Denies any CP, SOB, ABD pain. We will continue therapy today.  Plan is to go Skilled nursing facility after hospital stay.  Plan for discharge tomorrow.  Objective: Vital signs in last 24 hours: Temp:  [98.2 F (36.8 C)-98.6 F (37 C)] 98.6 F (37 C) (04/26 2306) Pulse Rate:  [73-88] 83 (04/26 2306) Resp:  [18] 18 (04/26 2306) BP: (97-133)/(51-66) 113/61 (04/26 2306) SpO2:  [92 %-98 %] 94 % (04/26 2306)  Intake/Output from previous day: 04/26 0701 - 04/27 0700 In: 2040.8 [I.V.:1653.8; IV Piggyback:387.1] Out: -  Intake/Output this shift: Total I/O In: 817.5 [I.V.:817.5] Out: -   Recent Labs    03/04/18 1041 03/05/18 0530  HGB 12.2 10.9*   Recent Labs    03/04/18 1041 03/05/18 0530  WBC 5.2 6.1  RBC 4.14 3.73*  HCT 35.5 32.3*  PLT 189 182   Recent Labs    03/04/18 1041 03/05/18 0530  NA  --  137  K  --  4.5  CL  --  106  CO2  --  27  BUN  --  18  CREATININE 0.81 0.76  GLUCOSE  --  114*  CALCIUM  --  8.3*   No results for input(s): LABPT, INR in the last 72 hours.  EXAM General - Patient is Alert, Appropriate and Oriented Extremity - Neurovascular intact Sensation intact distally Intact pulses distally Dorsiflexion/Plantar flexion intact No cellulitis present Compartment soft Dressing - dressing C/D/I and no drainage. Wound vac intact with mild drainage Motor Function - intact, moving foot and toes well on exam.  Ambulated 25 feet with physical therapy  Past Medical History:  Diagnosis Date  . Anxiety   . Asthma   . Complication of anesthesia    difficulty waking up from anesthesia  . GI bleed   . Hiatal hernia   . Hypertension   . IDA (iron deficiency anemia)     Assessment/Plan:   2 Days Post-Op Procedure(s) (LRB): TOTAL HIP  ARTHROPLASTY ANTERIOR APPROACH (Right) Active Problems:   Status post total hip replacement, right  Estimated body mass index is 34.42 kg/m as calculated from the following:   Height as of this encounter:  (1.575 m).   Weight as of this encounter: 85.4 kg (188 lb 3.2 oz). Advance diet Up with therapy  Needs BM Continue to monitor BP Plan on discharge to SNF Sunday  Please remove provena negative pressure dressing on 03/13/2018 and apply honey comb dressing. Keep dressing clean and dry at all times. TED hose x 6 weeks. Remove at night    DVT Prophylaxis - Lovenox, Foot Pumps and TED hose Weight-Bearing as tolerated to right leg   Dedra Skeens, PA-C [facility] 03/06/2018, 6:32 AM   Addendum: Nursing reported O2 sats decreased to 81% on room air earlier this morning.  O2 via nasal cannula restarted with sats now above 92%. The patient denies any coughing or wheezing.  She did report that she had an episode of bronchitis approximately 6 weeks ago requiring treatment.  Lungs were clear to auscultation bilateral. We will obtain chest x-ray today.  The patient was encouraged to use the incentive spirometry.  Continue physical therapy.  James P. Angie Fava M.D.

## 2018-03-06 NOTE — Progress Notes (Signed)
Physical Therapy Treatment Patient Details Name: Amanda [name] MRN: [id number] DOB: April 25, 1941 Today's Date: 03/06/2018    History of Present Illness 77 y/o female s/p R total hip (anterior approach) 4/25, she had her L hip replaced ~9 months ago.     PT Comments    Pt agreeable to PT; reports 9/10 pain in R hip. Pt extremely anxious with mobility tasks to the point she has great difficulty following instruction. Heavy and repetitive instruction/cueing required throughout. Bed mobility Min A primarily for RLE, STS Mod/Min A and short ambulation bed to chair with Min A. Continue PT to progress strength, endurance and safety to allow for improved functional mobility.    Follow Up Recommendations  SNF     Equipment Recommendations       Recommendations for Other Services       Precautions / Restrictions Precautions Precautions: Anterior Hip;Fall Restrictions Weight Bearing Restrictions: Yes RLE Weight Bearing: Weight bearing as tolerated    Mobility  Bed Mobility Overal bed mobility: Needs Assistance Bed Mobility: Supine to Sit     Supine to sit: Min assist     General bed mobility comments: Min A and heavy encouragement/cues   Transfers Overall transfer level: Needs assistance Equipment used: Rolling walker (2 wheeled) Transfers: Sit to/from Stand Sit to Stand: Min assist;Mod assist         General transfer comment: Initial poor attempt to stand with heavy forward lean to the R and attempting to use primarily UEs. Re education with improved overall ability, but difficulty persists due to pain and axnxiety  Ambulation/Gait Ambulation/Gait assistance: Min assist Ambulation Distance (Feet): 5 Feet Assistive device: Rolling walker (2 wheeled) Gait Pattern/deviations: Step-to pattern;Antalgic;Trunk flexed   Gait velocity interpretation: <1.8 ft/sec, indicate of risk for recurrent falls General Gait Details: Moaning/yelling out with weight bearing R. Small steps  without regard to direction despite cues/instruction due to heightened anxiety. Overall can demonstrate weight transfer and acceptance through RLE and stepping with L.    Stairs             Wheelchair Mobility    Modified Rankin (Stroke Patients Only)       Balance Overall balance assessment: Needs assistance Sitting-balance support: Bilateral upper extremity supported;Feet supported Sitting balance-Leahy Scale: Good     Standing balance support: Bilateral upper extremity supported Standing balance-Leahy Scale: Fair                              Cognition Arousal/Alertness: Awake/alert Behavior During Therapy: Anxious Overall Cognitive Status: Within Functional Limits for tasks assessed                                 General Comments: Pt does not follow instruction well due to anxiety      Exercises Total Joint Exercises Ankle Circles/Pumps: AROM;Both;20 reps Quad Sets: Strengthening;Both;10 reps;Supine;Other (comment)(10x stand with weight shift R) Gluteal Sets: Strengthening;Both;10 reps;Supine(10 reps with standing) Long Arc Quad: AAROM;Right;10 reps;Seated(2 sets)    General Comments        Pertinent Vitals/Pain Pain Assessment: 0-10 Pain Score: 9  Pain Location: Right Hip Pain Descriptors / Indicators: Constant;Moaning;Sore;Sharp Pain Intervention(s): Limited activity within patient's tolerance;Monitored during session;Premedicated before session;Repositioned    Home Living                      Prior Function  PT Goals (current goals can now be found in the care plan section) Progress towards PT goals: Progressing toward goals(slowly)    Frequency    BID      PT Plan Current plan remains appropriate    Co-evaluation              AM-PAC PT "6 Clicks" Daily Activity  Outcome Measure  Difficulty turning over in bed (including adjusting bedclothes, sheets and blankets)?:  Unable Difficulty moving from lying on back to sitting on the side of the bed? : Unable Difficulty sitting down on and standing up from a chair with arms (e.g., wheelchair, bedside commode, etc,.)?: Unable Help needed moving to and from a bed to chair (including a wheelchair)?: A Lot Help needed walking in hospital room?: A Lot Help needed climbing 3-5 steps with a railing? : Total 6 Click Score: 8    End of Session Equipment Utilized During Treatment: Gait belt Activity Tolerance: Patient limited by fatigue;Patient limited by pain;Other (comment)(anxiety) Patient left: in chair;with call bell/phone within reach;with chair alarm set Nurse Communication: Mobility status PT Visit Diagnosis: Muscle weakness (generalized) (M62.81);Difficulty in walking, not elsewhere classified (R26.2)     Time: 4098-1191 PT Time Calculation (min) (ACUTE ONLY): 33 min  Charges:  $Gait Training: 8-22 mins $Therapeutic Exercise: 8-22 mins                    G Codes:        Scot Dock, PTA 03/06/2018, 12:43 PM

## 2018-03-06 NOTE — Progress Notes (Signed)
OT Cancellation Note  Patient Details Name: Amanda [name] MRN: [id number] DOB: [birth year]   Cancelled Treatment:     Attempted to see patient this am, she reports she has just been up for toileting and exercises and is fatigued.  She declined OT this am and reports she is familiar with AE from previous surgery, she is having a new onset of breathing difficulties and is now on O2.  Will continue attempts. Patient may be discharging to [facility] tomorrow.  Amy T Lovett, OTR/L, CLT  Lovett,Amy 03/06/2018, 10:07 AM

## 2018-03-06 NOTE — Progress Notes (Signed)
PT Cancellation Note  Patient Details Name: Amanda [name] MRN: [id number] DOB: [birth year]   Cancelled Treatment:    Reason Eval/Treat Not Completed: Patient declined, no reason specified;Fatigue/lethargy limiting ability to participate. Treatment attempted, pt currently on [facility] and refuses PT at this time (needs more time for bathroom) and at a later time today. Pt notes she is too fatigued from morning and wishes back to bed when finished to rest. Continue PT tomorrow.    Scot Dock, PTA 03/06/2018, 2:23 PM

## 2018-03-07 NOTE — Progress Notes (Signed)
   Subjective: 3 Days Post-Op Procedure(s) (LRB): TOTAL HIP ARTHROPLASTY ANTERIOR APPROACH (Right) Patient reports pain as mild to moderate.  She feels like she is improving. Patient is well, and has had no acute complaints or problems Denies any CP, SOB, ABD pain.  Breathing much improved from yesterday.  Up to the chair this morning. We will continue therapy today.  Plan is to go Skilled nursing facility after hospital stay.  Plan for discharge today.  Objective: Vital signs in last 24 hours: Temp:  [98.6 F (37 C)-99 F (37.2 C)] 98.7 F (37.1 C) (04/27 2339) Pulse Rate:  [77-82] 77 (04/27 2339) Resp:  [18] 18 (04/27 2339) BP: (97-124)/(55-77) 110/71 (04/28 0123) SpO2:  [87 %-96 %] 94 % (04/28 0624)  Intake/Output from previous day: 04/27 0701 - 04/28 0700 In: 360 [P.O.:360] Out: -  Intake/Output this shift: No intake/output data recorded.  Recent Labs    03/04/18 1041 03/05/18 0530  HGB 12.2 10.9*   Recent Labs    03/04/18 1041 03/05/18 0530  WBC 5.2 6.1  RBC 4.14 3.73*  HCT 35.5 32.3*  PLT 189 182   Recent Labs    03/04/18 1041 03/05/18 0530  NA  --  137  K  --  4.5  CL  --  106  CO2  --  27  BUN  --  18  CREATININE 0.81 0.76  GLUCOSE  --  114*  CALCIUM  --  8.3*   No results for input(s): LABPT, INR in the last 72 hours.  EXAM General - Patient is Alert, Appropriate and Oriented Extremity - Neurovascular intact Sensation intact distally Intact pulses distally Dorsiflexion/Plantar flexion intact No cellulitis present Compartment soft Dressing - dressing C/D/I and no drainage. Wound vac intact with mild drainage Motor Function - intact, moving foot and toes well on exam.  Ambulated 5 feet yesterday but 25 feet the day before with physical therapy  Past Medical History:  Diagnosis Date  . Anxiety   . Asthma   . Complication of anesthesia    difficulty waking up from anesthesia  . GI bleed   . Hiatal hernia   . Hypertension   . IDA (iron  deficiency anemia)     Assessment/Plan:   3 Days Post-Op Procedure(s) (LRB): TOTAL HIP ARTHROPLASTY ANTERIOR APPROACH (Right) Active Problems:   Status post total hip replacement, right  Estimated body mass index is 34.42 kg/m as calculated from the following:   Height as of this encounter:  (1.575 m).   Weight as of this encounter: 85.4 kg (188 lb 3.2 oz). Advance diet Up with therapy   Continue to monitor BP Plan on discharge to SNF today  Please remove provena negative pressure dressing on 03/13/2018 and apply honey comb dressing. Keep dressing clean and dry at all times. TED hose x 6 weeks. Remove at night    DVT Prophylaxis - Lovenox, Foot Pumps and TED hose Weight-Bearing as tolerated to right leg   Dedra Skeens, PA-C [facility] 03/07/2018, 7:09 AM

## 2018-03-07 NOTE — Progress Notes (Signed)
Pt alert and oriented. Medicated for pain during the night. Up to bedside commode to void with assistance. Still requiring oxygen during the night.

## 2018-03-07 NOTE — Progress Notes (Signed)
Physical Therapy Treatment Patient Details Name: Brendy Ficek MRN: [id number] DOB: [birth year] Today's Date: 03/07/2018    History of Present Illness 77 y/o female s/p R total hip (anterior approach) 4/25, she had her L hip replaced ~9 months ago.     PT Comments    Ambulation and mobility tasks were declined by the patient today; however, she was agreeable to exercises in the chair.Patient tolerated her chair exercises fairly well today. Patient was able to perform them with mild cueing.   Follow Up Recommendations  SNF     Equipment Recommendations       Recommendations for Other Services       Precautions / Restrictions Precautions Precautions: Anterior Hip;Fall Restrictions RLE Weight Bearing: Weight bearing as tolerated    Mobility  Bed Mobility               General bed mobility comments: Not performed, as patient was sitting up in chair  Transfers                 General transfer comment: Declined mobility tasks  Ambulation/Gait Ambulation/Gait assistance: (Declined mobility tasks)               Stairs             Wheelchair Mobility    Modified Rankin (Stroke Patients Only)       Balance                                            Cognition Arousal/Alertness: Awake/alert Behavior During Therapy: WFL for tasks assessed/performed                                          Exercises Total Joint Exercises Ankle Circles/Pumps: AROM;Both;10 reps Quad Sets: Strengthening;Both;10 reps Gluteal Sets: Strengthening;Both;10 reps Short Arc Quad: Strengthening;Both;10 reps Hip ABduction/ADduction: Left;Strengthening;10 reps    General Comments        Pertinent Vitals/Pain Pain Assessment: 0-10 Pain Score: 8  Pain Location: Right Hip Pain Descriptors / Indicators: Constant Pain Intervention(s): Patient requesting pain meds-RN notified;Limited activity within patient's tolerance(pain  medication prior to treatment)    Home Living                      Prior Function            PT Goals (current goals can now be found in the care plan section) Progress towards PT goals: Progressing toward goals    Frequency    BID      PT Plan Current plan remains appropriate    Co-evaluation              AM-PAC PT "6 Clicks" Daily Activity  Outcome Measure  Difficulty turning over in bed (including adjusting bedclothes, sheets and blankets)?: A Lot Difficulty moving from lying on back to sitting on the side of the bed? : A Lot Difficulty sitting down on and standing up from a chair with arms (e.g., wheelchair, bedside commode, etc,.)?: A Lot Help needed moving to and from a bed to chair (including a wheelchair)?: A Lot Help needed walking in hospital room?: A Lot Help needed climbing 3-5 steps with a railing? : Total 6 Click Score: 11    End of Session  PT Visit Diagnosis: Muscle weakness (generalized) (M62.81);Difficulty in walking, not elsewhere classified (R26.2)     Time: 1610-9604 PT Time Calculation (min) (ACUTE ONLY): 10 min  Charges:  $Therapeutic Exercise: 8-22 mins                    G Codes:          Wilhemina Cash, PT, DPT, CWCE 03/07/2018, 12:15 PM

## 2018-03-07 NOTE — Progress Notes (Signed)
EMS here to transport pt. 

## 2018-03-07 NOTE — Progress Notes (Signed)
Report called to [facility], EMS called for transportation.

## 2018-03-07 NOTE — Clinical Social Work Note (Signed)
The patient will discharge today to [facility]. The patient has requested EMS; however, her husband would like to try to take her himself. The CSW has included a medical necessity form in the discharge packet should they decide to pursue EMS transport. The facility is aware and in agreement. The CSW has delivered the discharge packet and is signing off. Please consult should additional needs arise.  Argentina Ponder, MSW, [facility] [phone]

## 2018-03-08 LAB — SURGICAL PATHOLOGY

## 2018-07-15 ENCOUNTER — Emergency Department: Payer: Medicare Other

## 2018-07-15 ENCOUNTER — Emergency Department
Admission: EM | Admit: 2018-07-15 | Discharge: 2018-07-15 | Disposition: A | Discharge status: Home / Self Care | Payer: Medicare Other | Attending: Student in an Organized Health Care Education/Training Program | Admitting: Student in an Organized Health Care Education/Training Program

## 2018-07-15 ENCOUNTER — Other Ambulatory Visit: Payer: Self-pay

## 2018-07-15 ENCOUNTER — Encounter: Payer: Self-pay | Admitting: Emergency Medicine

## 2018-07-15 DIAGNOSIS — S99921A Unspecified injury of right foot, initial encounter: Secondary | ICD-10-CM | POA: Diagnosis present

## 2018-07-15 DIAGNOSIS — Z79899 Other long term (current) drug therapy: Secondary | ICD-10-CM | POA: Insufficient documentation

## 2018-07-15 DIAGNOSIS — Z7901 Long term (current) use of anticoagulants: Secondary | ICD-10-CM | POA: Diagnosis not present

## 2018-07-15 DIAGNOSIS — W010XXA Fall on same level from slipping, tripping and stumbling without subsequent striking against object, initial encounter: Secondary | ICD-10-CM | POA: Insufficient documentation

## 2018-07-15 DIAGNOSIS — Y939 Activity, unspecified: Secondary | ICD-10-CM | POA: Diagnosis not present

## 2018-07-15 DIAGNOSIS — Y92009 Unspecified place in unspecified non-institutional (private) residence as the place of occurrence of the external cause: Secondary | ICD-10-CM | POA: Diagnosis not present

## 2018-07-15 DIAGNOSIS — Y999 Unspecified external cause status: Secondary | ICD-10-CM | POA: Insufficient documentation

## 2018-07-15 DIAGNOSIS — Z87891 Personal history of nicotine dependence: Secondary | ICD-10-CM | POA: Diagnosis not present

## 2018-07-15 DIAGNOSIS — I10 Essential (primary) hypertension: Secondary | ICD-10-CM | POA: Insufficient documentation

## 2018-07-15 DIAGNOSIS — S92251A Displaced fracture of navicular [scaphoid] of right foot, initial encounter for closed fracture: Secondary | ICD-10-CM | POA: Diagnosis not present

## 2018-07-15 DIAGNOSIS — T148XXA Other injury of unspecified body region, initial encounter: Secondary | ICD-10-CM

## 2018-07-15 DIAGNOSIS — J45909 Unspecified asthma, uncomplicated: Secondary | ICD-10-CM | POA: Diagnosis not present

## 2018-07-15 DIAGNOSIS — Z96643 Presence of artificial hip joint, bilateral: Secondary | ICD-10-CM | POA: Insufficient documentation

## 2018-07-15 NOTE — Discharge Instructions (Signed)
Your x-ray today looks like you may have a small fracture at the top of your foot.  You can put weight on your foot as tolerated.  Use your walker for support.  Follow-up with podiatry next week.

## 2018-07-15 NOTE — ED Notes (Signed)
See triage note  Presents s/p fall  Swelling and tenderness noted to top of foot  Good pulses

## 2018-07-15 NOTE — ED Triage Notes (Signed)
Pt tripped over cane. Fell and hurt right foot.  Bruising noted.

## 2018-07-15 NOTE — ED Provider Notes (Signed)
[facility] Emergency Department Provider Note  ____________________________________________  Time seen: Approximately 7:42 PM  I have reviewed the triage vital signs and the nursing notes.   HISTORY  Chief Complaint Fall    HPI Amanda [name] is a 77 y.o. female that presents to the emergency department for evaluation of right foot pain after fall today. Patient was getting up from her chair and lost her footing and twisted right ankle.  She is primarily having pain to the side of her ankle.  After she got to the ED, she noticed some pain in her hips.  She had both hips replaced in the last year.  She did not hit her head or lose consciousness.  No alleviating measures have been attempted. She lives with her husband.   Past Medical History:  Diagnosis Date  . Anxiety   . Asthma   . Complication of anesthesia    difficulty waking up from anesthesia  . GI bleed   . Hiatal hernia   . Hypertension   . IDA (iron deficiency anemia)     Patient Active Problem List   Diagnosis Date Noted  . 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 Primary localized osteoarthrosis of left hip 05/12/2017  . Diarrhea 01/09/2016  . 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 Absolute anemia 02/24/2015  . Acute bronchitis 02/24/2015  . Fracture of styloid process of radius 04/13/2014    Past Surgical History:  Procedure Laterality Date  . ABDOMINAL HYSTERECTOMY    . BACK SURGERY      lumbar fusion  . BILATERAL CARPAL TUNNEL RELEASE    . BREAST BIOPSY Left 01/17/2002  . collapsed  lung Left   . COLONOSCOPY  04/28/2012  . COLONOSCOPY  02-12-15   Dr Lemar Livings  . ESOPHAGEAL MANOMETRY N/A 06/13/2015   Procedure: ESOPHAGEAL MANOMETRY (EM);  Surgeon: Elnita Maxwell, MD;  Location: [facility] ENDOSCOPY;  Service: Endoscopy;  Laterality: N/A;  . ESOPHAGUS SURGERY  Oct 2016   Dr Alva Garnet in [place]  . KNEE ARTHROSCOPY Left   . PARAESOPHAGEAL HERNIA REPAIR  07-26-15   Dr Alva Garnet  .  PARATHYROIDECTOMY Left    one gland on left removed  . TOTAL HIP ARTHROPLASTY Left 05/12/2017   Procedure: TOTAL HIP ARTHROPLASTY ANTERIOR APPROACH;  Surgeon: Kennedy Bucker, MD;  Location: [facility] ORS;  Service: Orthopedics;  Laterality: Left;  . TOTAL HIP ARTHROPLASTY Right 03/04/2018   Procedure: TOTAL HIP ARTHROPLASTY ANTERIOR APPROACH;  Surgeon: Kennedy Bucker, MD;  Location: [facility] ORS;  Service: Orthopedics;  Laterality: Right;  . UPPER GI ENDOSCOPY  04/28/2012    Prior to Admission medications   Medication Sig Start Date End Date Taking? Authorizing Provider  acetaminophen (TYLENOL) 325 MG tablet Take 1-2 tablets (325-650 mg total) by mouth every 6 (six) hours as needed for mild pain (pain score 1-3 or temp > 100.5). 03/05/18   Evon Slack, PA-C  albuterol (PROVENTIL HFA;VENTOLIN HFA) 108 (90 BASE) MCG/ACT inhaler Inhale 1-2 puffs into the lungs every 6 (six) hours as needed for wheezing or shortness of breath.     [provider]  ALPRAZolam Prudy Feeler) 1 MG tablet Take 0.5-1 mg by mouth 3 (three) times daily as needed for anxiety.  02/15/15   [provider]  amLODipine (NORVASC) 5 MG tablet Take 5 mg by mouth daily.  02/13/15   [provider]  carvedilol (COREG) 6.25 MG tablet Take 6.25 mg by mouth daily.  02/08/15   [provider]  Cyanocobalamin (B-12 PO)  Take 1 capsule by mouth daily.    [provider]  cyclobenzaprine (FLEXERIL) 10 MG tablet Take 10 mg by mouth at bedtime as needed for muscle spasms.    [provider]  diphenoxylate-atropine (LOMOTIL) 2.5-0.025 MG tablet Take 2 tablets by mouth 4 (four) times daily as needed for diarrhea or loose stools.    [provider]  enoxaparin (LOVENOX) 40 MG/0.4ML injection Inject 0.4 mLs (40 mg total) into the skin daily for 14 days. 03/05/18 03/19/18  Evon Slack, PA-C  lipase/protease/amylase (CREON) 36000 UNITS CPEP capsule Take 36,000 Units by mouth 3 (three) times daily with  meals.    [provider]  oxyCODONE (OXY IR/ROXICODONE) 5 MG immediate release tablet Take 1-2 tablets (5-10 mg total) by mouth every 4 (four) hours as needed for moderate pain (pain score 4-6). 03/05/18   Evon Slack, PA-C    Allergies Other; Codeine; and Penicillins  History reviewed. No pertinent family history.  Social History Social History   Tobacco Use  . Smoking status: Former Smoker    Packs/day: 1.00    Years: 30.00    Pack years: 30.00    Types: Cigarettes  . Smokeless tobacco: Never Used  . Tobacco comment: 40 years ago  Substance Use Topics  . Alcohol use: No    Alcohol/week: 0.0 standard drinks  . Drug use: No     Review of Systems  Cardiovascular: No chest pain. Respiratory: No SOB. Gastrointestinal: No abdominal pain.  No nausea, no vomiting.  Musculoskeletal: Positive for foot and hip pain. Skin: Negative for rash, abrasions, lacerations, ecchymosis. Neurological: Negative for headaches, numbness or tingling   ____________________________________________   PHYSICAL EXAM:  VITAL SIGNS: ED Triage Vitals  Enc Vitals Group     BP 07/15/18 1832 (!) 149/80     Pulse Rate 07/15/18 1832 81     Resp 07/15/18 1832 18     Temp 07/15/18 1832 98.5 F (36.9 C)     Temp Source 07/15/18 1832 Oral     SpO2 07/15/18 1832 95 %     Weight 07/15/18 1831 187 lb 6.3 oz (85 kg)     Height --      Head Circumference --      Peak Flow --      Pain Score 07/15/18 1831 8     Pain Loc --      Pain Edu? --      Excl. in GC? --      Constitutional: Alert and oriented. Well appearing and in no acute distress. Eyes: Conjunctivae are normal. PERRL. EOMI. Head: Atraumatic. ENT:      Ears:      Nose: No congestion/rhinnorhea.      Mouth/Throat: Mucous membranes are moist.  Neck: No stridor. Cardiovascular: Normal rate, regular rhythm.  Good peripheral circulation.  Symmetric dorsalis pedis pulses bilaterally. Respiratory: Normal respiratory effort  without tachypnea or retractions. Lungs CTAB. Good air entry to the bases with no decreased or absent breath sounds. Gastrointestinal: Bowel sounds 4 quadrants. Soft and nontender to palpation. No guarding or rigidity. No palpable masses. No distention.  Musculoskeletal: Full range of motion to all extremities. No gross deformities appreciated.  Tenderness to palpation over lateral malleolus with some tenderness over anterior ankle.  No swelling.  Full range of motion of hips bilaterally. Neurologic:  Normal speech and language. No gross focal neurologic deficits are appreciated.  Skin:  Skin is warm, dry and intact. No rash noted. Psychiatric: Mood and affect  are normal. Speech and behavior are normal. Patient exhibits appropriate insight and judgement.   ____________________________________________   LABS (all labs ordered are listed, but only abnormal results are displayed)  Labs Reviewed - No data to display ____________________________________________  EKG   ____________________________________________  RADIOLOGY Lexine Baton, personally viewed and evaluated these images (plain radiographs) as part of my medical decision making, as well as reviewing the written report by the radiologist.  Dg Foot Complete Right  Result Date: 07/15/2018 CLINICAL DATA:  Trip and fall over cane. RIGHT foot pain and bruising. EXAM: RIGHT FOOT COMPLETE - 3+ VIEW COMPARISON:  None. FINDINGS: Subcentimeter calcification projecting medial mid to hindfoot only on frontal radiograph. Linear lucency dorsum of navicular bone. There is no evidence of dislocation. There is no evidence of arthropathy or other focal bone abnormality. Mild midfoot soft tissue swelling. Small plantar calcaneal spur. Mild vascular calcifications. IMPRESSION: Nondisplaced suspected navicular avulsion fracture.  No dislocation. Electronically Signed   By: Awilda Metro M.D.   On: 07/15/2018 19:20   Dg Hip Unilat W Or Wo Pelvis  2-3 Views Left  Result Date: 07/15/2018 CLINICAL DATA:  77 year old female with a history of prior bilateral hip replacements. She tripped over her cane and fell this evening. EXAM: DG HIP (WITH OR WITHOUT PELVIS) 2-3V LEFT COMPARISON:  Concurrently obtained radiographs of the right hip FINDINGS: The bony pelvis appears intact. Surgical changes of bilateral hip arthroplasty. No evidence of periprosthetic fracture or other hardware complication. The visualized bowel gas pattern is normal. IMPRESSION: Bilateral hip arthroplasties without evidence of hardware complication. Electronically Signed   By: Malachy Moan M.D.   On: 07/15/2018 20:34   Dg Hip Unilat W Or Wo Pelvis 2-3 Views Right  Result Date: 07/15/2018 CLINICAL DATA:  77 year old female with hip pain after tripping and falling EXAM: DG HIP (WITH OR WITHOUT PELVIS) 2-3V RIGHT COMPARISON:  Concurrently obtained radiographs of the pelvis and left hip FINDINGS: Surgical changes of prior hip arthroplasty. No evidence of periprosthetic fracture or complication. No lytic or blastic osseous lesion. IMPRESSION: Prior hip arthroplasty without evidence of complication. Electronically Signed   By: Malachy Moan M.D.   On: 07/15/2018 20:33    ____________________________________________    PROCEDURES  Procedure(s) performed:    Procedures    Medications - No data to display   ____________________________________________   INITIAL IMPRESSION / ASSESSMENT AND PLAN / ED COURSE  Pertinent labs & imaging results that were available during my care of the patient were reviewed by me and considered in my medical decision making (see chart for details).  Review of the [place] CSRS was performed in accordance of the [facility] prior to dispensing any controlled drugs.    Patient's diagnosis is consistent with navicular avulsion fracture.  Vital signs and exam are reassuring.  Hip x-rays are negative for acute abnormality.  Foot x-ray consistent with  suspected fracture.  Foot was Ace wrapped.  She was given.  Dan Humphreys was provided.   Patient is to follow up with podiatry as directed. Patient is given ED precautions to return to the ED for any worsening or new symptoms.     ____________________________________________  FINAL CLINICAL IMPRESSION(S) / ED DIAGNOSES  Final diagnoses:  Avulsion fracture  Injury of right foot, initial encounter      NEW MEDICATIONS STARTED DURING THIS VISIT:  ED Discharge Orders    None          This chart was dictated using voice recognition software/Dragon. Despite best efforts to  proofread, errors can occur which can change the meaning. Any change was purely unintentional.    Enid Derry, PA-C 07/15/18 2329    Willy Eddy, MD 07/19/18 [phone]

## 2019-02-28 ENCOUNTER — Other Ambulatory Visit: Payer: Self-pay | Admitting: Orthopedic Surgery

## 2019-02-28 DIAGNOSIS — M5442 Lumbago with sciatica, left side: Secondary | ICD-10-CM

## 2019-03-09 ENCOUNTER — Other Ambulatory Visit: Payer: Self-pay

## 2019-03-09 ENCOUNTER — Ambulatory Visit
Admission: RE | Admit: 2019-03-09 | Discharge: 2019-03-09 | Disposition: A | Discharge status: Home / Self Care | Payer: Medicare Other | Source: Ambulatory Visit | Attending: Orthopedic Surgery | Admitting: Orthopedic Surgery

## 2019-03-09 DIAGNOSIS — M5442 Lumbago with sciatica, left side: Secondary | ICD-10-CM | POA: Diagnosis not present

## 2019-04-12 ENCOUNTER — Ambulatory Visit: Payer: Medicare Other

## 2019-07-03 ENCOUNTER — Encounter: Payer: Self-pay | Admitting: Emergency Medicine

## 2019-07-03 ENCOUNTER — Emergency Department: Payer: Medicare Other

## 2019-07-03 ENCOUNTER — Emergency Department
Admission: EM | Admit: 2019-07-03 | Discharge: 2019-07-03 | Disposition: A | Discharge status: Home / Self Care | Payer: Medicare Other | Attending: Emergency Medicine | Admitting: Emergency Medicine

## 2019-07-03 ENCOUNTER — Other Ambulatory Visit: Payer: Self-pay

## 2019-07-03 DIAGNOSIS — T887XXA Unspecified adverse effect of drug or medicament, initial encounter: Secondary | ICD-10-CM | POA: Insufficient documentation

## 2019-07-03 DIAGNOSIS — J45909 Unspecified asthma, uncomplicated: Secondary | ICD-10-CM | POA: Insufficient documentation

## 2019-07-03 DIAGNOSIS — W010XXA Fall on same level from slipping, tripping and stumbling without subsequent striking against object, initial encounter: Secondary | ICD-10-CM | POA: Diagnosis not present

## 2019-07-03 DIAGNOSIS — T50905A Adverse effect of unspecified drugs, medicaments and biological substances, initial encounter: Secondary | ICD-10-CM | POA: Diagnosis not present

## 2019-07-03 DIAGNOSIS — Y69 Unspecified misadventure during surgical and medical care: Secondary | ICD-10-CM | POA: Insufficient documentation

## 2019-07-03 DIAGNOSIS — I10 Essential (primary) hypertension: Secondary | ICD-10-CM | POA: Diagnosis not present

## 2019-07-03 DIAGNOSIS — Y93K9 Activity, other involving animal care: Secondary | ICD-10-CM | POA: Insufficient documentation

## 2019-07-03 DIAGNOSIS — W19XXXA Unspecified fall, initial encounter: Secondary | ICD-10-CM

## 2019-07-03 DIAGNOSIS — Z96641 Presence of right artificial hip joint: Secondary | ICD-10-CM | POA: Diagnosis not present

## 2019-07-03 DIAGNOSIS — R52 Pain, unspecified: Secondary | ICD-10-CM | POA: Diagnosis present

## 2019-07-03 LAB — URINALYSIS, COMPLETE (UACMP) WITH MICROSCOPIC
Bacteria, UA: NONE SEEN
Bilirubin Urine: NEGATIVE
Glucose, UA: NEGATIVE mg/dL
Hgb urine dipstick: NEGATIVE
Ketones, ur: NEGATIVE mg/dL
Nitrite: NEGATIVE
Protein, ur: NEGATIVE mg/dL
Specific Gravity, Urine: 1.012 (ref 1.005–1.030)
pH: 5 (ref 5.0–8.0)

## 2019-07-03 LAB — CBC
HCT: 42.1 % (ref 36.0–46.0)
Hemoglobin: 13.6 g/dL (ref 12.0–15.0)
MCH: 27.5 pg (ref 26.0–34.0)
MCHC: 32.3 g/dL (ref 30.0–36.0)
MCV: 85.2 fL (ref 80.0–100.0)
Platelets: 175 10*3/uL (ref 150–400)
RBC: 4.94 MIL/uL (ref 3.87–5.11)
RDW: 13.8 % (ref 11.5–15.5)
WBC: 7.2 10*3/uL (ref 4.0–10.5)
nRBC: 0 % (ref 0.0–0.2)

## 2019-07-03 LAB — COMPREHENSIVE METABOLIC PANEL
ALT: 13 U/L (ref 0–44)
AST: 29 U/L (ref 15–41)
Albumin: 4.3 g/dL (ref 3.5–5.0)
Alkaline Phosphatase: 74 U/L (ref 38–126)
Anion gap: 11 (ref 5–15)
BUN: 30 mg/dL — ABNORMAL HIGH (ref 8–23)
CO2: 24 mmol/L (ref 22–32)
Calcium: 9.8 mg/dL (ref 8.9–10.3)
Chloride: 104 mmol/L (ref 98–111)
Creatinine, Ser: 1.26 mg/dL — ABNORMAL HIGH (ref 0.44–1.00)
GFR calc Af Amer: 47 mL/min — ABNORMAL LOW (ref >60)
GFR calc non Af Amer: 40 mL/min — ABNORMAL LOW (ref >60)
Glucose, Bld: 101 mg/dL — ABNORMAL HIGH (ref 70–99)
Potassium: 3.9 mmol/L (ref 3.5–5.1)
Sodium: 139 mmol/L (ref 135–145)
Total Bilirubin: 0.8 mg/dL (ref 0.3–1.2)
Total Protein: 7.3 g/dL (ref 6.5–8.1)

## 2019-07-03 LAB — CK: Total CK: 362 U/L — ABNORMAL HIGH (ref 38–234)

## 2019-07-03 MED ORDER — SODIUM CHLORIDE 0.9 % IV SOLN
1000.0000 mL | Freq: Once | INTRAVENOUS | Status: AC
  Administered 2019-07-03: 1000 mL via INTRAVENOUS

## 2019-07-03 NOTE — Discharge Instructions (Addendum)
Please follow-up with your doctor to discuss removing medications as we discussed

## 2019-07-03 NOTE — ED Notes (Signed)
Pt continues to sleep and be very drowsy. Unable to answer questions coherently

## 2019-07-03 NOTE — ED Provider Notes (Signed)
[facility] Emergency Department Provider Note   ____________________________________________    I have reviewed the triage vital signs and the nursing notes.   HISTORY  Chief Complaint Fall     HPI Amanda [name] is a 78 y.o. female who presents after a fall.  Patient reports she fell in the bathroom and was unable to get up, family thinks that she may have been on the floor for nearly 2 hours but they are not sure.  Patient denies head injury neck pain back pain.  She reports chronic knee pain.  No hip injury.  No shortness of breath no syncopal episode.  No nausea vomiting abdominal pain  Past Medical History:  Diagnosis Date  . Anxiety   . Asthma   . Complication of anesthesia    difficulty waking up from anesthesia  . GI bleed   . Hiatal hernia   . Hypertension   . IDA (iron deficiency anemia)     Patient Active Problem List   Diagnosis Date Noted  . 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 Primary localized osteoarthrosis of left hip 05/12/2017  . Diarrhea 01/09/2016  . 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 Absolute anemia 02/24/2015  . Acute bronchitis 02/24/2015  . Fracture of styloid process of radius 04/13/2014    Past Surgical History:  Procedure Laterality Date  . ABDOMINAL HYSTERECTOMY    . BACK SURGERY      lumbar fusion  . BILATERAL CARPAL TUNNEL RELEASE    . BREAST BIOPSY Left 01/17/2002  . collapsed  lung Left   . COLONOSCOPY  04/28/2012  . COLONOSCOPY  02-12-15   Dr Lemar LivingsByrnett  . ESOPHAGEAL MANOMETRY N/A 06/13/2015   Procedure: ESOPHAGEAL MANOMETRY (EM);  Surgeon: Elnita MaxwellMatthew Gordon Rein, MD;  [facility] ENDOSCOPY;  Service: Endoscopy;  Laterality: N/A;  . ESOPHAGUS SURGERY  Oct 2016   [place]  . KNEE ARTHROSCOPY Left   . PARAESOPHAGEAL HERNIA REPAIR  07-26-15   Dr Alva Garnetyner  . PARATHYROIDECTOMY Left    one gland on left removed  . TOTAL HIP ARTHROPLASTY Left 05/12/2017   Procedure: TOTAL HIP  ARTHROPLASTY ANTERIOR APPROACH;  Surgeon: Kennedy BuckerMenz, Michael, MD;  Location: [facility] ORS;  Service: Orthopedics;  Laterality: Left;  . TOTAL HIP ARTHROPLASTY Right 03/04/2018   Procedure: TOTAL HIP ARTHROPLASTY ANTERIOR APPROACH;  Surgeon: Kennedy BuckerMenz, Michael, MD;  Location: [facility] ORS;  Service: Orthopedics;  Laterality: Right;  . UPPER GI ENDOSCOPY  04/28/2012    Prior to Admission medications   Medication Sig Start Date End Date Taking? Authorizing Provider  acetaminophen (TYLENOL) 325 MG tablet Take 1-2 tablets (325-650 mg total) by mouth every 6 (six) hours as needed for mild pain (pain score 1-3 or temp > 100.5). 03/05/18   Evon SlackGaines, Thomas C, PA-C  albuterol (PROVENTIL HFA;VENTOLIN HFA) 108 (90 BASE) MCG/ACT inhaler Inhale 1-2 puffs into the lungs every 6 (six) hours as needed for wheezing or shortness of breath.     [provider]  ALPRAZolam Prudy Feeler(XANAX) 1 MG tablet Take 0.5-1 mg by mouth 3 (three) times daily as needed for anxiety.  02/15/15   [provider]  amLODipine (NORVASC) 5 MG tablet Take 5 mg by mouth daily.  02/13/15   [provider]  carvedilol (COREG) 6.25 MG tablet Take 6.25 mg by mouth daily.  02/08/15   [provider]  Cyanocobalamin (B-12 PO) Take 1 capsule by mouth daily.    [provider]  cyclobenzaprine (FLEXERIL) 10 MG tablet  Take 10 mg by mouth at bedtime as needed for muscle spasms.    [provider]  diphenoxylate-atropine (LOMOTIL) 2.5-0.025 MG tablet Take 2 tablets by mouth 4 (four) times daily as needed for diarrhea or loose stools.    [provider]  enoxaparin (LOVENOX) 40 MG/0.4ML injection Inject 0.4 mLs (40 mg total) into the skin daily for 14 days. 03/05/18 03/19/18  Duanne Guess, PA-C  lipase/protease/amylase (CREON) 36000 UNITS CPEP capsule Take 36,000 Units by mouth 3 (three) times daily with meals.    [provider]  oxyCODONE (OXY IR/ROXICODONE) 5 MG immediate release tablet Take 1-2 tablets (5-10 mg  total) by mouth every 4 (four) hours as needed for moderate pain (pain score 4-6). 03/05/18   Duanne Guess, PA-C     Allergies Other, Codeine, and Penicillins  History reviewed. No pertinent family history.  Social History Social History   Tobacco Use  . Smoking status: Former Smoker    Packs/day: 1.00    Years: 30.00    Pack years: 30.00    Types: Cigarettes  . Smokeless tobacco: Never Used  . Tobacco comment: 40 years ago  Substance Use Topics  . Alcohol use: No    Alcohol/week: 0.0 standard drinks  . Drug use: No    Review of Systems  Constitutional: No fever/chills Eyes: No visual changes.  ENT: No neck pain Cardiovascular: Denies chest pain. Respiratory: Denies shortness of breath. Gastrointestinal: No abdominal pain.  No nausea, no vomiting.   Genitourinary: Negative for dysuria. Musculoskeletal: As above Skin: Negative for rash. Neurological: Negative for headaches    ____________________________________________   PHYSICAL EXAM:  VITAL SIGNS: ED Triage Vitals  Enc Vitals Group     BP 07/03/19 1748 (!) 151/80     Pulse Rate 07/03/19 1748 76     Resp 07/03/19 1748 16     Temp 07/03/19 1748 97.6 F (36.4 C)     Temp Source 07/03/19 1748 Oral     SpO2 07/03/19 1748 98 %     Weight 07/03/19 1750 81.6 kg (180 lb)     Height 07/03/19 1750 1.575 m (5\' 2" )     Head Circumference --      Peak Flow --      Pain Score 07/03/19 1748 8     Pain Loc --      Pain Edu? --      Excl. in [place]? --     Constitutional: Alert and oriented.  Somewhat drowsy Eyes: Conjunctivae are normal.   Nose: No congestion/rhinnorhea. Mouth/Throat: Mucous membranes are moist.    Cardiovascular: Normal rate, regular rhythm. Grossly normal heart sounds.  Good peripheral circulation.  No chest wall tenderness palpation Respiratory: Normal respiratory effort.  No retractions. Lungs CTAB. Gastrointestinal: Soft and nontender. No distention.  No CVA tenderness. Genitourinary:  deferred Musculoskeletal: Normal range of motion of all extremities.  No pain with axial load on both hips.  No vertebral tenderness palpation..  Warm and well perfused Neurologic:  Normal speech and language. No gross focal neurologic deficits are appreciated.  Skin:  Skin is warm, dry and intact. No rash noted. Psychiatric: Mood and affect are normal. Speech and behavior are normal.  ____________________________________________   LABS (all labs ordered are listed, but only abnormal results are displayed)  Labs Reviewed  COMPREHENSIVE METABOLIC PANEL - Abnormal; Notable for the following components:      Result Value   Glucose, Bld 101 (*)    BUN 30 (*)  Creatinine, Ser 1.26 (*)    GFR calc non Af Amer 40 (*)    GFR calc Af Amer 47 (*)    All other components within normal limits  URINALYSIS, COMPLETE (UACMP) WITH MICROSCOPIC - Abnormal; Notable for the following components:   Color, Urine YELLOW (*)    APPearance HAZY (*)    Leukocytes,Ua MODERATE (*)    All other components within normal limits  CK - Abnormal; Notable for the following components:   Total CK 362 (*)    All other components within normal limits  CBC   ____________________________________________  EKG  ED ECG REPORT I, Jene Everyobert Alyus Mofield, the attending physician, personally viewed and interpreted this ECG.  Date: 07/03/2019  Rhythm: normal sinus rhythm QRS Axis: normal Intervals: normal ST/T Wave abnormalities: normal Narrative Interpretation: no evidence of acute ischemia  ____________________________________________  RADIOLOGY  None ____________________________________________   PROCEDURES  Procedure(s) performed: No  Procedures   Critical Care performed: No ____________________________________________   INITIAL IMPRESSION / ASSESSMENT AND PLAN / ED COURSE  Pertinent labs & imaging results that were available during my care of the patient were reviewed by me and considered in my  medical decision making (see chart for details).  Patient presents after a fall, she reports this is happened before.  She denies physical injury.  She is somewhat drowsy is on significant amount of narcotics and benzos reported chronic pain.  Lab work is thus far reassuring, mildly dehydration, minimally elevated CK, will give IV fluids, reevaluate.  ----------------------------------------- 9:22 PM on 07/03/2019 -----------------------------------------  Patient is alert and oriented and at her baseline.  We did have a discussion about polypharmacy which her husband feels is the cause of her altered mental status when she arrived.  Moderate leukocytes on urinalysis but no dysuria.  No indication for admission at this time as the patient is at her baseline, recommend close follow-up with PCP for further discussion about decreasing medications    ____________________________________________   FINAL CLINICAL IMPRESSION(S) / ED DIAGNOSES  Final diagnoses:  Fall, initial encounter  Medication side effects, initial encounter        Note:  This document was prepared using Dragon voice recognition software and may include unintentional dictation errors.   Jene EveryKinner, Grissel Tyrell, MD 07/03/19 2123

## 2019-07-03 NOTE — ED Notes (Signed)
Report given to Jennifer, RN

## 2019-07-03 NOTE — ED Triage Notes (Signed)
Pt via ems from home after a mechanical fall. She was leaning over to play with her dog and fell, resulting in lower back pain (bilateral). Pt denies LOC; family thinks she may have been in the floor for as long as two hours. Pt is sleepy as she takes several oxycontin per day, as well as xanax. Pt does answer questions appropriately. NAD noted.

## 2020-07-01 ENCOUNTER — Emergency Department: Payer: Medicare Other

## 2020-07-01 ENCOUNTER — Other Ambulatory Visit: Payer: Self-pay

## 2020-07-01 ENCOUNTER — Emergency Department
Admission: EM | Admit: 2020-07-01 | Discharge: 2020-07-01 | Disposition: A | Discharge status: Home / Self Care | Payer: Medicare Other | Attending: Emergency Medicine | Admitting: Emergency Medicine

## 2020-07-01 DIAGNOSIS — Y999 Unspecified external cause status: Secondary | ICD-10-CM | POA: Insufficient documentation

## 2020-07-01 DIAGNOSIS — Y92090 Kitchen in other non-institutional residence as the place of occurrence of the external cause: Secondary | ICD-10-CM | POA: Diagnosis not present

## 2020-07-01 DIAGNOSIS — I10 Essential (primary) hypertension: Secondary | ICD-10-CM | POA: Insufficient documentation

## 2020-07-01 DIAGNOSIS — J45909 Unspecified asthma, uncomplicated: Secondary | ICD-10-CM | POA: Insufficient documentation

## 2020-07-01 DIAGNOSIS — Z79899 Other long term (current) drug therapy: Secondary | ICD-10-CM | POA: Insufficient documentation

## 2020-07-01 DIAGNOSIS — Y939 Activity, unspecified: Secondary | ICD-10-CM | POA: Diagnosis not present

## 2020-07-01 DIAGNOSIS — Z96642 Presence of left artificial hip joint: Secondary | ICD-10-CM | POA: Diagnosis not present

## 2020-07-01 DIAGNOSIS — S99921A Unspecified injury of right foot, initial encounter: Secondary | ICD-10-CM | POA: Diagnosis present

## 2020-07-01 DIAGNOSIS — Z87891 Personal history of nicotine dependence: Secondary | ICD-10-CM | POA: Insufficient documentation

## 2020-07-01 DIAGNOSIS — W208XXA Other cause of strike by thrown, projected or falling object, initial encounter: Secondary | ICD-10-CM | POA: Diagnosis not present

## 2020-07-01 DIAGNOSIS — T1490XA Injury, unspecified, initial encounter: Secondary | ICD-10-CM

## 2020-07-01 DIAGNOSIS — Z96641 Presence of right artificial hip joint: Secondary | ICD-10-CM | POA: Diagnosis not present

## 2020-07-01 DIAGNOSIS — S9031XA Contusion of right foot, initial encounter: Secondary | ICD-10-CM | POA: Insufficient documentation

## 2020-07-01 MED ORDER — OXYCODONE-ACETAMINOPHEN 5-325 MG PO TABS
1.0000 | ORAL_TABLET | Freq: Once | ORAL | Status: AC
  Administered 2020-07-01: 1 via ORAL
  Filled 2020-07-01: qty 1

## 2020-07-01 NOTE — ED Provider Notes (Signed)
[facility] Emergency Department Provider Note  ____________________________________________  Time seen: Approximately 9:43 PM  I have reviewed the triage vital signs and the nursing notes.   HISTORY  Chief Complaint Foot Pain    HPI Amanda [name] is a 79 y.o. female who presents the emergency department complaining of right foot pain.  Patient states that she was working in the kitchen, dropped a heavy bowl on her right foot.  Patient states that it started to bruise and given the amount of pain she was concerned that she may have fractured her foot.  Patient is still ambulatory on the foot.  No other injury or complaint.  This did not break the skin.  Patient has had no medications prior to arrival for this complaint.  Medical history as described below with no complications with chronic medical issues.         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   Absolute anemia 02/24/2015   Acute bronchitis 02/24/2015   Fracture of styloid process of radius 04/13/2014    Past Surgical History:  Procedure Laterality Date   ABDOMINAL HYSTERECTOMY     BACK SURGERY      lumbar fusion   BILATERAL CARPAL TUNNEL RELEASE     BREAST BIOPSY Left 01/17/2002   collapsed  lung Left    COLONOSCOPY  04/28/2012   COLONOSCOPY  02-12-15   Dr Lemar Livings   ESOPHAGEAL MANOMETRY N/A 06/13/2015   Procedure: ESOPHAGEAL MANOMETRY (EM);  Surgeon: Elnita Maxwell, MD;  Location: [facility] ENDOSCOPY;  Service: Endoscopy;  Laterality: N/A;   ESOPHAGUS SURGERY  Oct 2016   Dr Alva Garnet  in [place]   KNEE ARTHROSCOPY Left    PARAESOPHAGEAL HERNIA REPAIR  07-26-15   Dr Alva Garnet   PARATHYROIDECTOMY Left    one gland on left removed   TOTAL HIP ARTHROPLASTY Left 05/12/2017   Procedure: TOTAL HIP ARTHROPLASTY ANTERIOR APPROACH;  Surgeon: Kennedy Bucker, MD;  Location: [facility] ORS;  Service: Orthopedics;  Laterality: Left;   TOTAL HIP ARTHROPLASTY Right 03/04/2018   Procedure: TOTAL HIP ARTHROPLASTY ANTERIOR APPROACH;  Surgeon: Kennedy Bucker, MD;  Location: [facility] ORS;  Service: Orthopedics;  Laterality: Right;   UPPER GI ENDOSCOPY  04/28/2012    Prior to Admission medications   Medication Sig Start Date End Date Taking? Authorizing Provider  acetaminophen (TYLENOL) 325 MG tablet Take 1-2 tablets (325-650 mg total) by mouth every 6 (six) hours as needed for mild pain (pain score 1-3 or temp > 100.5). 03/05/18   Evon Slack, PA-C  albuterol (PROVENTIL HFA;VENTOLIN HFA) 108 (90 BASE) MCG/ACT inhaler Inhale 1-2 puffs into the lungs every 6 (six) hours as needed for wheezing or shortness of breath.     [provider]  ALPRAZolam Prudy Feeler) 1 MG tablet Take 0.5-1 mg by mouth 3 (three) times daily as needed for anxiety.  02/15/15   [provider]  amLODipine (NORVASC) 5 MG tablet Take 5 mg by mouth daily.  02/13/15   [provider]  carvedilol (COREG) 6.25 MG tablet  Take 6.25 mg by mouth daily.  02/08/15   [provider]  Cyanocobalamin (B-12 PO) Take 1 capsule by mouth daily.    [provider]  cyclobenzaprine (FLEXERIL) 10 MG tablet Take 10 mg by mouth at bedtime as needed for muscle spasms.    [provider]  diphenoxylate-atropine (LOMOTIL) 2.5-0.025 MG tablet Take 2 tablets by mouth 4 (four) times daily as needed for diarrhea or loose stools.    [provider]  enoxaparin (LOVENOX) 40 MG/0.4ML injection Inject 0.4 mLs (40 mg total) into the skin daily for 14 days. 03/05/18 03/19/18  Evon Slack, PA-C   lipase/protease/amylase (CREON) 36000 UNITS CPEP capsule Take 36,000 Units by mouth 3 (three) times daily with meals.    [provider]  oxyCODONE (OXY IR/ROXICODONE) 5 MG immediate release tablet Take 1-2 tablets (5-10 mg total) by mouth every 4 (four) hours as needed for moderate pain (pain score 4-6). 03/05/18   Evon Slack, PA-C    Allergies Other, Codeine, and Penicillins  History reviewed. No pertinent family history.  Social History Social History   Tobacco Use   Smoking status: Former Smoker    Packs/day: 1.00    Years: 30.00    Pack years: 30.00    Types: Cigarettes   Smokeless tobacco: Never Used   Tobacco comment: 40 years ago  Vaping Use   Vaping Use: Never used  Substance Use Topics   Alcohol use: No    Alcohol/week: 0.0 standard drinks   Drug use: No     Review of Systems  Constitutional: No fever/chills Eyes: No visual changes. No discharge ENT: No upper respiratory complaints. Cardiovascular: no chest pain. Respiratory: no cough. No SOB. Musculoskeletal: Patient dropped a mole on her right foot.  Bruising and pain.  Still ambulating on foot. Skin: Negative for rash, abrasions, lacerations, ecchymosis. Neurological: Negative for headaches, focal weakness or numbness. 10-point ROS otherwise negative.  ____________________________________________   PHYSICAL EXAM:  VITAL SIGNS: ED Triage Vitals  Enc Vitals Group     BP 07/01/20 1739 115/73     Pulse Rate 07/01/20 1739 72     Resp 07/01/20 1739 18     Temp 07/01/20 1739 98.9 F (37.2 C)     Temp Source 07/01/20 1739 Oral     SpO2 07/01/20 1739 98 %     Weight 07/01/20 1739 170 lb (77.1 kg)     Height 07/01/20 1739 5\' 4"  (1.626 m)     Head Circumference --      Peak Flow --      Pain Score 07/01/20 1743 8     Pain Loc --      Pain Edu? --      Excl. in [place]? --      Constitutional: Alert and oriented. Well appearing and in no acute distress. Eyes: Conjunctivae are  normal. PERRL. EOMI. Head: Atraumatic. ENT:      Ears:       Nose: No congestion/rhinnorhea.      Mouth/Throat: Mucous membranes are moist.  Neck: No stridor.   Cardiovascular: Normal rate, regular rhythm. Normal S1 and S2.  Good peripheral circulation. Respiratory: Normal respiratory effort without tachypnea or retractions. Lungs CTAB. Good air entry to the bases with no decreased or absent breath sounds. Musculoskeletal: Full range of motion to all extremities. No gross deformities appreciated. Neurologic:  Normal speech and language. No gross focal neurologic deficits are appreciated.  Skin:  Skin is warm, dry and intact. No rash  noted. Psychiatric: Mood and affect are normal. Speech and behavior are normal. Patient exhibits appropriate insight and judgement.   ____________________________________________   LABS (all labs ordered are listed, but only abnormal results are displayed)  Labs Reviewed - No data to display ____________________________________________  EKG   ____________________________________________  RADIOLOGY I personally viewed and evaluated these images as part of my medical decision making, as well as reviewing the written report by the radiologist.  DG Foot Complete Right  Result Date: 07/01/2020 CLINICAL DATA:  Right foot trauma, right foot pain EXAM: RIGHT FOOT COMPLETE - 3+ VIEW COMPARISON:  None. FINDINGS: Three view radiograph right foot demonstrates a healed remote second metatarsal neck fracture with minimal residual deformity. Normal overall alignment. No acute fracture or dislocation. Small plantar calcaneal spur. Vascular calcifications are seen within the soft tissues. IMPRESSION: 1. No acute fracture or dislocation. 2. Healed remote second metatarsal neck fracture. Electronically Signed   By: Helyn Numbers MD   On: 07/01/2020 18:45    ____________________________________________    PROCEDURES  Procedure(s) performed:     Procedures    Medications  oxyCODONE-acetaminophen (PERCOCET/ROXICET) 5-325 MG per tablet 1 tablet (1 tablet Oral Given 07/01/20 2220)     ____________________________________________   INITIAL IMPRESSION / ASSESSMENT AND PLAN / ED COURSE  Pertinent labs & imaging results that were available during my care of the patient were reviewed by me and considered in my medical decision making (see chart for details).  Review of the [place] CSRS was performed in accordance of the [facility] prior to dispensing any controlled drugs.           Patient's diagnosis is consistent with incision of the right foot.  Patient presents emergency department after dropping a ball on her right foot.  Exam is reassuring.  No acute findings on x-ray.  Patient is already on chronic pain medications and can take additional Tylenol if needed for additional pain relief.  Follow-up with primary care as needed. Patient is given ED precautions to return to the ED for any worsening or new symptoms.     ____________________________________________  FINAL CLINICAL IMPRESSION(S) / ED DIAGNOSES  Final diagnoses:  Trauma  Contusion of right foot, initial encounter      NEW MEDICATIONS STARTED DURING THIS VISIT:  ED Discharge Orders    None          This chart was dictated using voice recognition software/Dragon. Despite best efforts to proofread, errors can occur which can change the meaning. Any change was purely unintentional.    Racheal Patches, PA-C 07/01/20 2302    Chesley Noon, MD 07/01/20 2329

## 2020-07-01 NOTE — ED Triage Notes (Signed)
Pt dropped something on her right foot while cooking. Pain and minor swelling at top of foot near toes.

## 2020-08-29 ENCOUNTER — Encounter: Payer: Self-pay | Admitting: *Deleted

## 2020-09-11 ENCOUNTER — Other Ambulatory Visit: Payer: Self-pay

## 2020-09-13 ENCOUNTER — Ambulatory Visit: Payer: Medicare Other | Admitting: Gastroenterology

## 2020-09-13 ENCOUNTER — Other Ambulatory Visit: Payer: Self-pay

## 2020-09-13 ENCOUNTER — Encounter: Payer: Self-pay | Admitting: Gastroenterology

## 2020-09-13 VITALS — BP 152/91 | HR 93 | Temp 97.6°F | Wt 190.0 lb

## 2020-09-13 DIAGNOSIS — R197 Diarrhea, unspecified: Secondary | ICD-10-CM | POA: Diagnosis not present

## 2020-09-13 DIAGNOSIS — M6289 Other specified disorders of muscle: Secondary | ICD-10-CM

## 2020-09-13 MED ORDER — COLESTIPOL HCL 1 G PO TABS: 2.0000 g | ORAL_TABLET | Freq: Every day | ORAL | 1 refills | Status: DC

## 2020-09-13 NOTE — Progress Notes (Signed)
Melodie Bouillon [address]  [address]  [place], [place] [address]  Main: [phone]  Fax: [phone]   Gastroenterology Consultation  Referring Provider:     Elisabeth Most, FNP Primary Care Physician:  Elisabeth Most, FNP Reason for Consultation:     Diarrhea        HPI:    Chief complaint: Diarrhea  Janelle Spellman is a 79 y.o. y/o female referred for consultation & management  by Dr. Hollice Espy, Christena Deem, FNP.  Patient reports chronic history of diarrhea, fecal incontinence and urinary incontinence.  Remembers her symptoms starting after her hiatal hernia surgery.  Review of her records reveals that in 2016 patient underwent work-up for anemia, including EGD and colonoscopy and capsule study by Dr. Lemar Livings.  Please see procedure report for details.  She then underwent laparoscopic large paraesophageal hiatal hernia repair associated with Sheria Lang ulcers and chronic anemia along with chest pain and shortness of breath.  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  Patient does report having days without any bowel movements, and then other days will have 8-2 and levels bowel movements a day associated with incontinence as well.  Also reports chronic history of bladder incontinence.  Previous history of vaginal deliveries.  Uses antidiarrheal as needed.  Only takes 1-2 on the days that she takes it.  Drinks coffee daily.  Drinks [employer] daily.  No blood in stool.  No nausea or vomiting.  No weight loss.  No family history of colon cancer.  She started taking Pepto-Bismol as that helped with her diarrhea and has noted changes in color of her stool since then but not prior to then.  Past Medical History:  Diagnosis Date  . Anxiety   . Asthma   . Complication of anesthesia    difficulty waking up from anesthesia  . GI bleed   . Hiatal hernia   . Hypertension     . IDA (iron deficiency anemia)     Past Surgical History:  Procedure Laterality Date  . ABDOMINAL HYSTERECTOMY    . BACK SURGERY      lumbar fusion  . BILATERAL CARPAL TUNNEL RELEASE    . BREAST BIOPSY Left 01/17/2002  . collapsed  lung Left   . COLONOSCOPY  04/28/2012  . COLONOSCOPY  02-12-15   Dr Lemar Livings  . ESOPHAGEAL MANOMETRY N/A 06/13/2015   Procedure: ESOPHAGEAL MANOMETRY (EM);  Surgeon: Elnita Maxwell, MD;  Location: [facility] ENDOSCOPY;  Service: Endoscopy;  Laterality: N/A;  . ESOPHAGUS SURGERY  Oct 2016   Dr Alva Garnet in [place]  . KNEE ARTHROSCOPY Left   . PARAESOPHAGEAL HERNIA REPAIR  07-26-15   Dr Alva Garnet  . PARATHYROIDECTOMY Left    one gland on left removed  . TOTAL HIP ARTHROPLASTY Left 05/12/2017   Procedure: TOTAL HIP ARTHROPLASTY ANTERIOR APPROACH;  Surgeon: Kennedy Bucker, MD;  Location: [facility] ORS;  Service: Orthopedics;  Laterality: Left;  . TOTAL HIP ARTHROPLASTY Right 03/04/2018   Procedure: TOTAL HIP ARTHROPLASTY ANTERIOR APPROACH;  Surgeon: Kennedy Bucker, MD;  Location: [facility] ORS;  Service: Orthopedics;  Laterality: Right;  . UPPER GI ENDOSCOPY  04/28/2012    Prior to Admission medications   Medication Sig Start Date End Date Taking? Authorizing Provider  ALPRAZolam Prudy Feeler) 1 MG tablet Take 0.5-1 mg by mouth 3 (three) times daily as needed for anxiety.  02/15/15  Yes [provider]  amLODipine (NORVASC) 5 MG tablet Take  5 mg by mouth daily.  02/13/15  Yes [provider]  carvedilol (COREG) 6.25 MG tablet Take 6.25 mg by mouth daily.  02/08/15  Yes [provider]  Cholecalciferol 25 MCG (1000 UT) CHEW Chew 1 tablet by mouth daily.   Yes [provider]  Cyanocobalamin (B-12 PO) Take 1 capsule by mouth daily.   Yes [provider]  cyclobenzaprine (FLEXERIL) 10 MG tablet Take 10 mg by mouth at bedtime as needed for muscle spasms.   Yes [provider]  diphenoxylate-atropine (LOMOTIL) 2.5-0.025 MG tablet Take 2 tablets by  mouth 4 (four) times daily as needed for diarrhea or loose stools.   Yes [provider]  HYDROcodone-acetaminophen (NORCO) 10-325 MG tablet Take 1 tablet by mouth every 6 (six) hours as needed. 08/14/20  Yes [provider]  lipase/protease/amylase (CREON) 36000 UNITS CPEP capsule Take 36,000 Units by mouth 3 (three) times daily with meals.   Yes [provider]  oxybutynin (DITROPAN) 5 MG tablet Take 5 mg by mouth 2 (two) times daily. 08/13/20  Yes [provider]  oxyCODONE (OXY IR/ROXICODONE) 5 MG immediate release tablet Take 1-2 tablets (5-10 mg total) by mouth every 4 (four) hours as needed for moderate pain (pain score 4-6). 03/05/18  Yes Evon Slack, PA-C  triamterene-hydrochlorothiazide (MAXZIDE-25) 37.5-25 MG tablet Take 1 tablet by mouth daily. 02/18/20  Yes [provider]  albuterol (PROVENTIL HFA;VENTOLIN HFA) 108 (90 BASE) MCG/ACT inhaler Inhale 1-2 puffs into the lungs every 6 (six) hours as needed for wheezing or shortness of breath.  Patient not taking: Reported on 09/13/2020    [provider]  cetirizine (ZYRTEC) 10 MG tablet Take 10 mg by mouth daily. Patient not taking: Reported on 09/13/2020 06/06/20   [provider]  gabapentin (NEURONTIN) 300 MG capsule Take 300 mg by mouth 3 (three) times daily as needed. Patient not taking: Reported on 09/13/2020 06/06/20   [provider]    History reviewed. No pertinent family history.   Social History   Tobacco Use  . Smoking status: Former Smoker    Packs/day: 1.00    Years: 30.00    Pack years: 30.00    Types: Cigarettes  . Smokeless tobacco: Never Used  . Tobacco comment: 40 years ago  Vaping Use  . Vaping Use: Never used  Substance Use Topics  . Alcohol use: No    Alcohol/week: 0.0 standard drinks  . Drug use: No    Allergies as of 09/13/2020 - Review Complete 09/13/2020  Allergen Reaction Noted  . Other Other (See Comments) 04/27/2017  .  Codeine Nausea And Vomiting 02/21/2015  . Penicillins Hives 02/21/2015    Review of Systems:    All systems reviewed and negative except where noted in HPI.   Physical Exam:  BP (!) 152/91   Pulse 93   Temp 97.6 F (36.4 C) (Oral)   Wt 190 lb (86.2 kg)   BMI 32.61 kg/m  No LMP recorded. Patient has had a hysterectomy. Psych:  Alert and cooperative. Normal mood and affect. General:   Alert,  Well-developed, well-nourished, pleasant and cooperative in NAD Head:  Normocephalic and atraumatic. Eyes:  Sclera clear, no icterus.   Conjunctiva pink. Ears:  Normal auditory acuity. Nose:  No deformity, discharge, or lesions. Mouth:  No deformity or lesions,oropharynx pink & moist. Neck:  Supple; no masses or thyromegaly. Abdomen:  Normal bowel sounds.  No bruits.  Soft, non-tender and non-distended without masses, hepatosplenomegaly or hernias noted.  No guarding or rebound tenderness.    Rectal.  No perianal lesions, slightly decreased rectal tone, no masses in rectal vault Msk:  Symmetrical without gross deformities. Good, equal movement & strength bilaterally. Pulses:  Normal pulses noted. Extremities:  No clubbing or edema.  No cyanosis. Neurologic:  Alert and oriented x3;  grossly normal neurologically. Skin:  Intact without significant lesions or rashes. No jaundice. Lymph Nodes:  No significant cervical adenopathy. Psych:  Alert and cooperative. Normal mood and affect.   Labs: CBC    Component Value Date/Time   WBC 7.2 07/03/2019 1753   RBC 4.94 07/03/2019 1753   HGB 13.6 07/03/2019 1753   HGB 7.7 (L) 02/13/2015 0516   HCT 42.1 07/03/2019 1753   HCT 25.8 (L) 02/13/2015 0516   PLT 175 07/03/2019 1753   PLT 260 02/13/2015 0516   MCV 85.2 07/03/2019 1753   MCV 64 (L) 02/13/2015 0516   MCH 27.5 07/03/2019 1753   MCHC 32.3 07/03/2019 1753   RDW 13.8 07/03/2019 1753   RDW 30.5 (H) 02/21/2015 1618   RDW 28.6 (H) 02/13/2015 0516   LYMPHSABS 1.4 08/13/2017 1040   LYMPHSABS  1.6 02/21/2015 1618   LYMPHSABS 1.3 02/13/2015 0516   MONOABS 0.3 08/13/2017 1040   MONOABS 0.3 02/13/2015 0516   EOSABS 0.1 08/13/2017 1040   EOSABS 0.2 02/21/2015 1618   EOSABS 0.1 02/13/2015 0516   BASOSABS 0.1 08/13/2017 1040   BASOSABS 0.1 02/21/2015 1618   BASOSABS 0.0 02/13/2015 0516   CMP     Component Value Date/Time   NA 139 07/03/2019 1753   NA 139 02/13/2015 0516   K 3.9 07/03/2019 1753   K 3.4 (L) 02/13/2015 0516   CL 104 07/03/2019 1753   CL 104 02/13/2015 0516   CO2 24 07/03/2019 1753   CO2 28 02/13/2015 0516   GLUCOSE 101 (H) 07/03/2019 1753   GLUCOSE 90 02/13/2015 0516   BUN 30 (H) 07/03/2019 1753   BUN 15 02/13/2015 0516   CREATININE 1.26 (H) 07/03/2019 1753   CREATININE 1.12 (H) 02/13/2015 0516   CALCIUM 9.8 07/03/2019 1753   CALCIUM 8.6 (L) 02/13/2015 0516   PROT 7.3 07/03/2019 1753   PROT 6.7 02/09/2015 1254   ALBUMIN 4.3 07/03/2019 1753   ALBUMIN 3.6 02/09/2015 1254   AST 29 07/03/2019 1753   AST 16 02/09/2015 1254   ALT 13 07/03/2019 1753   ALT 12 (L) 02/09/2015 1254   ALKPHOS 74 07/03/2019 1753   ALKPHOS 68 02/09/2015 1254   BILITOT 0.8 07/03/2019 1753   BILITOT 0.4 02/09/2015 1254   GFRNONAA 40 (L) 07/03/2019 1753   GFRNONAA 48 (L) 02/13/2015 0516   GFRAA 47 (L) 07/03/2019 1753   GFRAA 56 (L) 02/13/2015 0516    Imaging Studies: No results found.  Assessment and Plan:   Nefertiti Mohamad is a 79 y.o. 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  Patient agreeable to referral.  Placed  2016 colonoscopy otherwise reassuring  Start Colestid for diarrhea  If symptoms not improve, we can consider change in dose  Patient advised to decrease beverages with sugar or sugar substitutes    Dr Melodie Bouillon  Speech recognition software was used to dictate the above note.

## 2020-09-13 NOTE — Patient Instructions (Signed)
[facility] will be calling you to schedule you an appointment. If you do not hear from them in 2 weeks, please call us at [phone] for [place] to follow up.

## 2020-09-19 IMAGING — CT CT HEAD WITHOUT CONTRAST
4 series · 16 of 47 positions shown, 18 images · non-contrast
Comparison: None.

CLINICAL DATA: Fall.

EXAM:
CT HEAD WITHOUT CONTRAST
TECHNIQUE: Contiguous axial images were obtained from the base of the skull
through the vertex without intravenous contrast.

[Series 2: head bone · axial · 0.43mm/px · z∈[-110,-82]mm · 3 of 70 slices shown]
[im 7/70  bone]
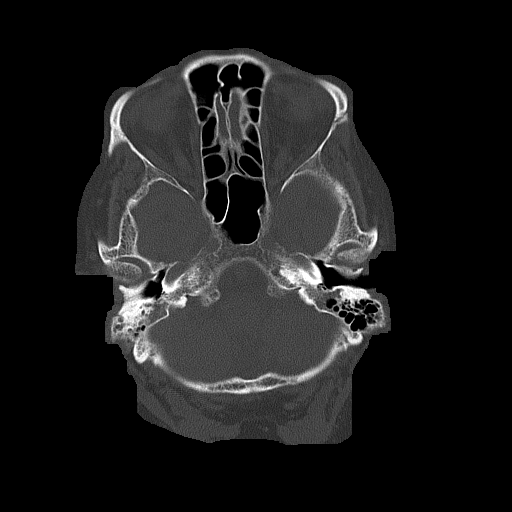
[im 14/70  bone]
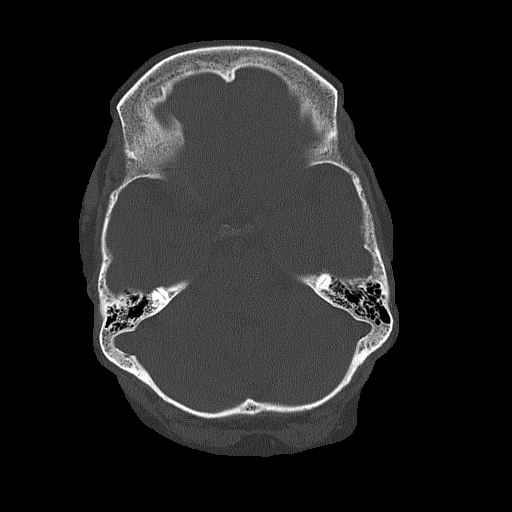
[im 21/70  bone]
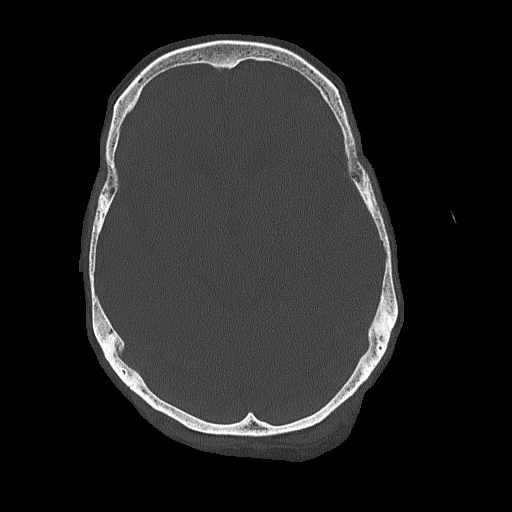

[Series 3: head wo · axial · 0.43mm/px · z∈[-107,-7]mm · 7 of 28 slices shown, 9 images]
[im 4/28  brain]
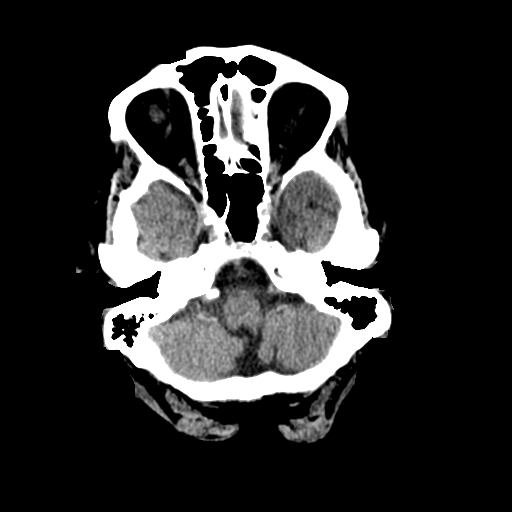
[im 4/28  bone]
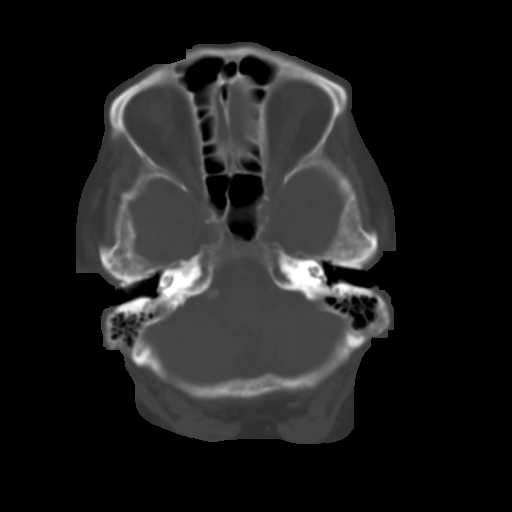
[im 7/28  brain]
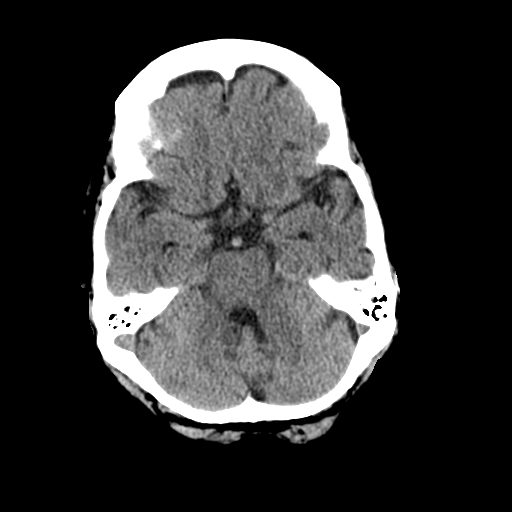
[im 11/28  brain]
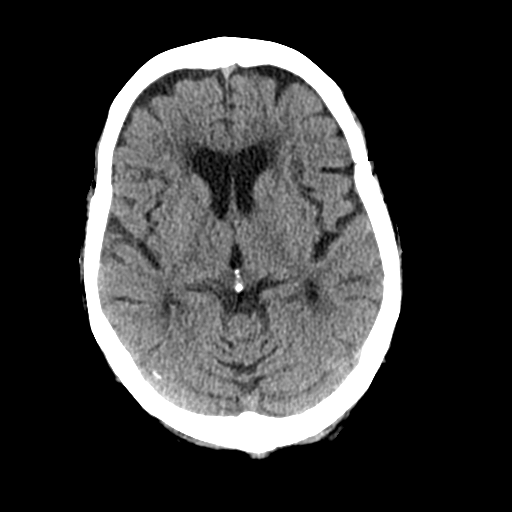
[im 14/28  brain]
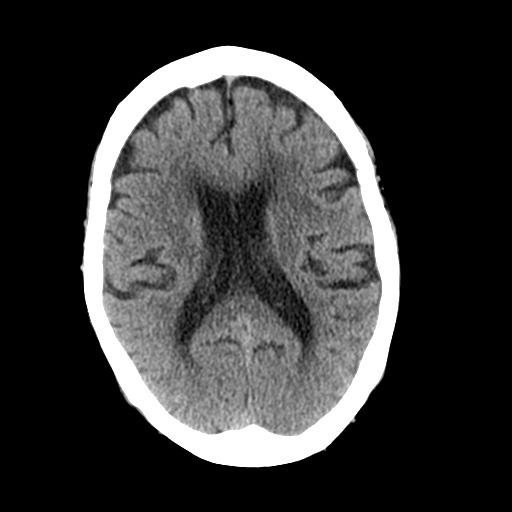
[im 17/28  brain]
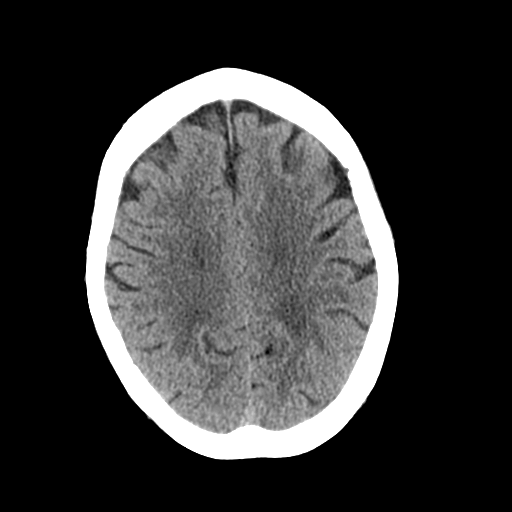
[im 17/28  bone]
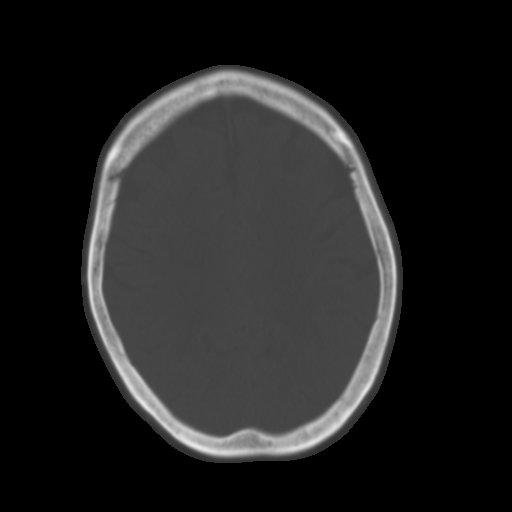
[im 21/28  brain]
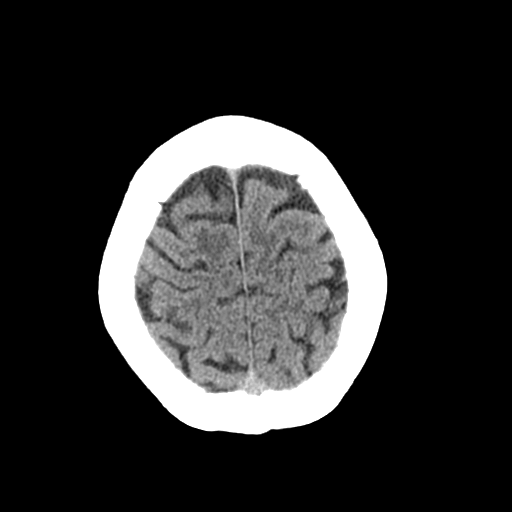
[im 24/28  brain]
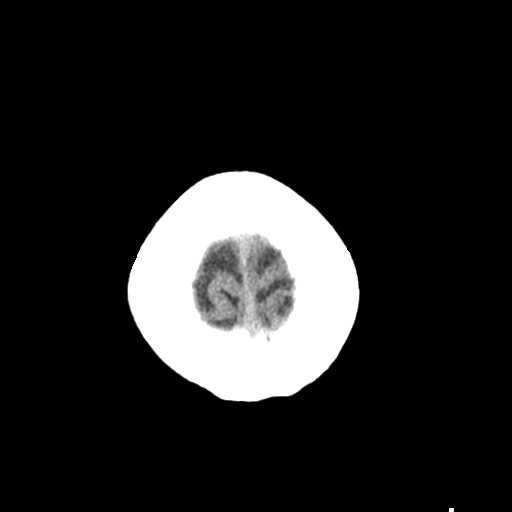

[Series 4: coronal soft tissue · coronal · 0.27mm/px · 3 of 64 slices shown]
[im 22/64  brain]
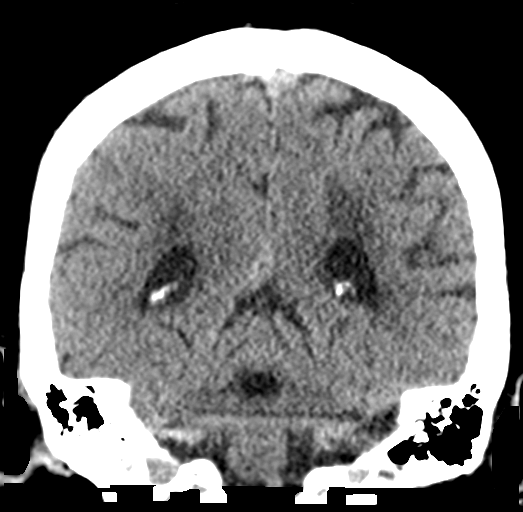
[im 29/64  brain]
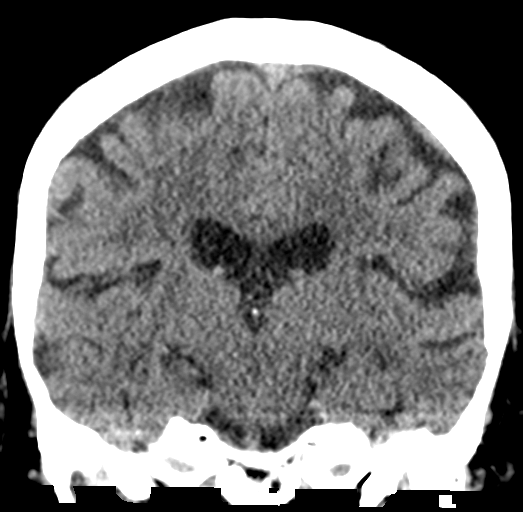
[im 36/64  brain]
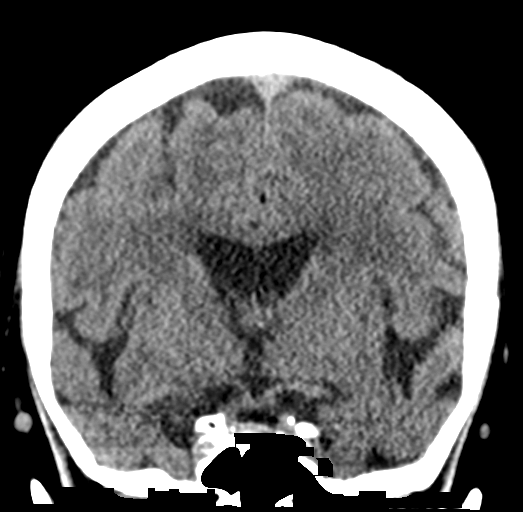

[Series 5: sagittal soft tissue · sagittal · 0.27mm/px · 3 of 48 slices shown]
[im 16/48  brain]
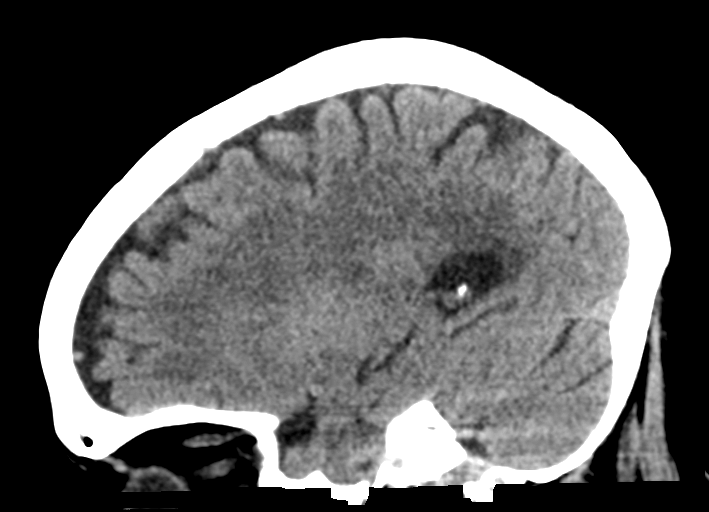
[im 24/48  brain]
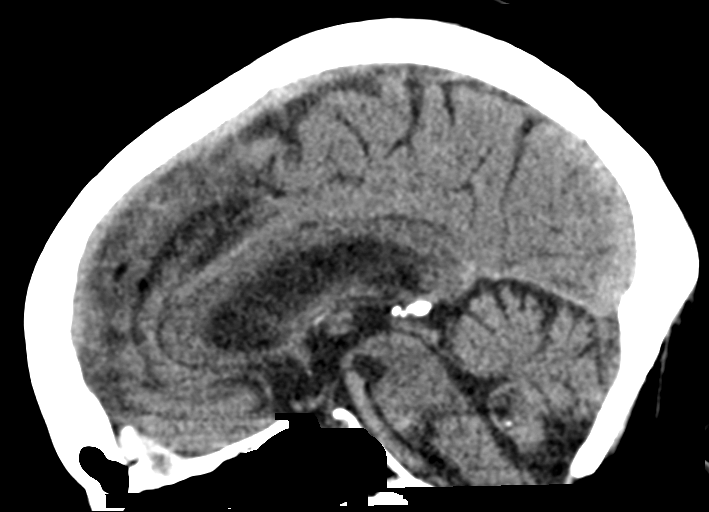
[im 32/48  brain]
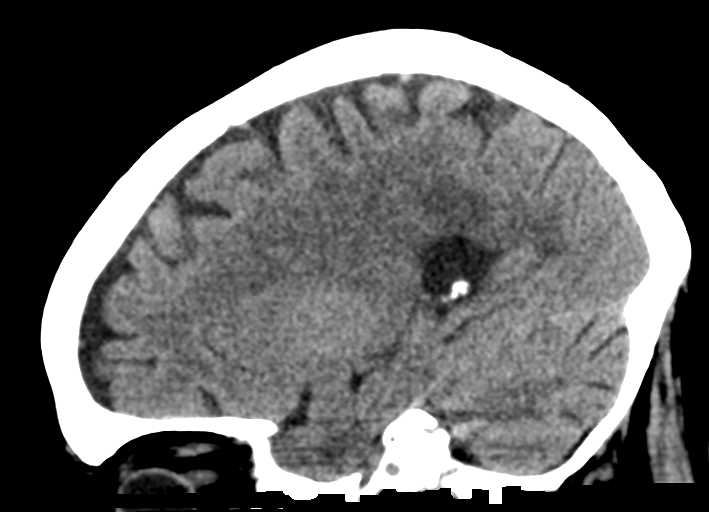

[16 of 47 positions shown; findings below may reference images not displayed]

FINDINGS: Brain: There is no evidence for acute hemorrhage, hydrocephalus,
mass lesion, or abnormal extra-axial fluid collection. No definite
CT evidence for acute infarction. Diffuse loss of parenchymal volume
is consistent with atrophy. Patchy low attenuation in the deep
hemispheric and periventricular white matter is nonspecific, but
likely reflects chronic microvascular ischemic demyelination.

Vascular: No hyperdense vessel or unexpected calcification.

Skull: No evidence for fracture. No worrisome lytic or sclerotic
lesion.

Sinuses/Orbits: The visualized paranasal sinuses and mastoid air
cells are clear. Visualized portions of the globes and intraorbital
fat are unremarkable.

Other: None.
IMPRESSION: No acute intracranial abnormality.

Atrophy with chronic small vessel white matter ischemic disease.

## 2020-10-06 ENCOUNTER — Other Ambulatory Visit: Payer: Self-pay | Admitting: Gastroenterology

## 2020-11-05 ENCOUNTER — Other Ambulatory Visit: Payer: Self-pay | Admitting: Gastroenterology

## 2020-11-06 NOTE — Telephone Encounter (Signed)
Last office visit 09/13/2020 diarrhea  Last refill 09/13/2020 1 refills  Has appointment 12/19/2020

## 2020-12-05 ENCOUNTER — Other Ambulatory Visit: Payer: Self-pay | Admitting: Gastroenterology

## 2020-12-12 DIAGNOSIS — G8929 Other chronic pain: Secondary | ICD-10-CM | POA: Diagnosis not present

## 2020-12-12 DIAGNOSIS — W540XXA Bitten by dog, initial encounter: Secondary | ICD-10-CM | POA: Diagnosis not present

## 2020-12-12 DIAGNOSIS — Z79899 Other long term (current) drug therapy: Secondary | ICD-10-CM | POA: Diagnosis not present

## 2020-12-14 DIAGNOSIS — R609 Edema, unspecified: Secondary | ICD-10-CM | POA: Diagnosis not present

## 2020-12-14 DIAGNOSIS — I10 Essential (primary) hypertension: Secondary | ICD-10-CM | POA: Diagnosis not present

## 2020-12-19 ENCOUNTER — Telehealth (INDEPENDENT_AMBULATORY_CARE_PROVIDER_SITE_OTHER): Payer: Medicare Other | Admitting: Gastroenterology

## 2020-12-19 ENCOUNTER — Other Ambulatory Visit: Payer: Self-pay

## 2020-12-19 DIAGNOSIS — R197 Diarrhea, unspecified: Secondary | ICD-10-CM | POA: Diagnosis not present

## 2020-12-19 NOTE — Progress Notes (Signed)
Melodie Bouillon, MD [address]  [address]  [place], [place] [address]  Main: [phone]  Fax: [phone]   Primary Care Physician: Elisabeth Most, FNP  Virtual Visit via Telephone Note  I connected with patient on 12/19/20 at  1:15 PM EST by telephone and verified that I am speaking with the correct person using two identifiers.  Patient was scheduled for MyChart video visit today but was unable to connect and therefore this was changed over to a phone visit   I discussed the limitations, risks, security and privacy concerns of performing an evaluation and management service by telephone and the availability of in person appointments. I also discussed with the patient that there may be a patient responsible charge related to this service. The patient expressed understanding and agreed to proceed.  Location of Patient: Home Location of Provider: Home Persons involved: Patient and provider only during the visit (nursing staff and front desk staff was involved in communicating with the patient prior to the appointment, reviewing medications and checking them in)   History of Present Illness: Chief complaint: Diarrhea  HPI: Lanina Larranaga is a 80 y.o. female here for follow-up of diarrhea.  Patient was started on Colestid on last visit and reports significant improvement in symptoms since then.  She states she does not have any more diarrhea and is very happy with her bowel movement pattern at this time.  No abdominal pain, nausea vomiting, dysphagia, blood in stool.  Current Outpatient Medications  Medication Sig Dispense Refill  . albuterol (PROVENTIL HFA;VENTOLIN HFA) 108 (90 BASE) MCG/ACT inhaler Inhale 1-2 puffs into the lungs every 6 (six) hours as needed for wheezing or shortness of breath.  (Patient not taking: Reported on 09/13/2020)    . ALPRAZolam (XANAX) 1 MG tablet Take 0.5-1 mg by mouth 3 (three) times daily as needed for anxiety.     Marland Kitchen amLODipine (NORVASC) 5  MG tablet Take 5 mg by mouth daily.     . carvedilol (COREG) 6.25 MG tablet Take 6.25 mg by mouth daily.     . cetirizine (ZYRTEC) 10 MG tablet Take 10 mg by mouth daily. (Patient not taking: Reported on 09/13/2020)    . Cholecalciferol 25 MCG (1000 UT) CHEW Chew 1 tablet by mouth daily.    . colestipol (COLESTID) 1 g tablet TAKE 2 TABLETS (2 G TOTAL) BY MOUTH DAILY. 60 tablet 1  . Cyanocobalamin (B-12 PO) Take 1 capsule by mouth daily.    . cyclobenzaprine (FLEXERIL) 10 MG tablet Take 10 mg by mouth at bedtime as needed for muscle spasms.    . diphenoxylate-atropine (LOMOTIL) 2.5-0.025 MG tablet Take 2 tablets by mouth 4 (four) times daily as needed for diarrhea or loose stools.    . gabapentin (NEURONTIN) 300 MG capsule Take 300 mg by mouth 3 (three) times daily as needed. (Patient not taking: Reported on 09/13/2020)    . HYDROcodone-acetaminophen (NORCO) 10-325 MG tablet Take 1 tablet by mouth every 6 (six) hours as needed.    . lipase/protease/amylase (CREON) 36000 UNITS CPEP capsule Take 36,000 Units by mouth 3 (three) times daily with meals.    . meclizine (ANTIVERT) 12.5 MG tablet Take 1 tablet by mouth daily.    Marland Kitchen oxybutynin (DITROPAN) 5 MG tablet Take 5 mg by mouth 2 (two) times daily.    Marland Kitchen oxyCODONE (OXY IR/ROXICODONE) 5 MG immediate release tablet Take 1-2 tablets (5-10 mg total) by mouth every 4 (four) hours as needed for moderate pain (pain  score 4-6). 30 tablet 0  . triamterene-hydrochlorothiazide (MAXZIDE-25) 37.5-25 MG tablet Take 1 tablet by mouth daily.     No current facility-administered medications for this visit.    Allergies as of 12/19/2020 - Review Complete 09/13/2020  Allergen Reaction Noted  . Other Other (See Comments) 04/27/2017  . Codeine Nausea And Vomiting 02/21/2015  . Penicillins Hives 02/21/2015    Review of Systems:    All systems reviewed and negative except where noted in HPI.   Observations/Objective:  Labs: CMP     Component Value Date/Time    NA 139 07/03/2019 1753   NA 139 02/13/2015 0516   K 3.9 07/03/2019 1753   K 3.4 (L) 02/13/2015 0516   CL 104 07/03/2019 1753   CL 104 02/13/2015 0516   CO2 24 07/03/2019 1753   CO2 28 02/13/2015 0516   GLUCOSE 101 (H) 07/03/2019 1753   GLUCOSE 90 02/13/2015 0516   BUN 30 (H) 07/03/2019 1753   BUN 15 02/13/2015 0516   CREATININE 1.26 (H) 07/03/2019 1753   CREATININE 1.12 (H) 02/13/2015 0516   CALCIUM 9.8 07/03/2019 1753   CALCIUM 8.6 (L) 02/13/2015 0516   PROT 7.3 07/03/2019 1753   PROT 6.7 02/09/2015 1254   ALBUMIN 4.3 07/03/2019 1753   ALBUMIN 3.6 02/09/2015 1254   AST 29 07/03/2019 1753   AST 16 02/09/2015 1254   ALT 13 07/03/2019 1753   ALT 12 (L) 02/09/2015 1254   ALKPHOS 74 07/03/2019 1753   ALKPHOS 68 02/09/2015 1254   BILITOT 0.8 07/03/2019 1753   BILITOT 0.4 02/09/2015 1254   GFRNONAA 40 (L) 07/03/2019 1753   GFRNONAA 48 (L) 02/13/2015 0516   GFRAA 47 (L) 07/03/2019 1753   GFRAA 56 (L) 02/13/2015 0516   Lab Results  Component Value Date   WBC 7.2 07/03/2019   HGB 13.6 07/03/2019   HCT 42.1 07/03/2019   MCV 85.2 07/03/2019   PLT 175 07/03/2019    Imaging Studies: No results found.  Assessment and Plan:   Analese Sovine is a 80 y.o. y/o female here for follow-up of diarrhea  Assessment and Plan: Patient reports resolution of diarrhea and fecal incontinence at this time with Colestid  Continue Colestid at 2 g a day.  Proper use, discussed, and she was advised to take all her other medications at least an hour before the Colestid and she verbalized understanding  If symptoms return patient was advised to call us and she verbalized understanding  Follow Up Instructions: 6 months or sooner if symptoms recur   I discussed the assessment and treatment plan with the patient. The patient was provided an opportunity to ask questions and all were answered. The patient agreed with the plan and demonstrated an understanding of the instructions.   The patient was  advised to call back or seek an in-person evaluation if the symptoms worsen or if the condition fails to improve as anticipated.  I provided 12 minutes of non-face-to-face time during this encounter. Additional time was spent in reviewing patient's chart, placing orders etc.   Pasty Spillers, MD  Speech recognition software was used to dictate this note.

## 2021-01-03 ENCOUNTER — Other Ambulatory Visit: Payer: Self-pay | Admitting: Gastroenterology

## 2021-03-13 ENCOUNTER — Telehealth: Payer: Self-pay | Admitting: Gastroenterology

## 2021-03-13 NOTE — Telephone Encounter (Signed)
Patient called and is very upset and needs her diarrhea medication refilled (someone threw her bottle away and can't remember the name of the medication).  [facility] [place]  [facility] day [facility]  Please call patient when this is called in

## 2021-03-14 NOTE — Telephone Encounter (Signed)
[facility] [place] called regarding Colestipol 1 gram.  Waiting on RX to be refilled.  [facility] call [facility] [phone]

## 2021-03-18 ENCOUNTER — Telehealth: Payer: Self-pay | Admitting: Gastroenterology

## 2021-03-18 NOTE — Telephone Encounter (Signed)
Third call, please review and advise  [facility] [place] called regarding Colestipol 1 gram.  Waiting on RX to be refilled.  [facility] call [facility] [phone]

## 2021-04-04 DIAGNOSIS — G629 Polyneuropathy, unspecified: Secondary | ICD-10-CM | POA: Diagnosis not present

## 2021-04-04 DIAGNOSIS — G2581 Restless legs syndrome: Secondary | ICD-10-CM | POA: Diagnosis not present

## 2021-04-04 DIAGNOSIS — R262 Difficulty in walking, not elsewhere classified: Secondary | ICD-10-CM | POA: Diagnosis not present

## 2021-04-04 DIAGNOSIS — G894 Chronic pain syndrome: Secondary | ICD-10-CM | POA: Diagnosis not present

## 2021-04-10 ENCOUNTER — Other Ambulatory Visit: Payer: Self-pay | Admitting: Gastroenterology

## 2021-04-10 ENCOUNTER — Telehealth: Payer: Self-pay | Admitting: Gastroenterology

## 2021-04-10 NOTE — Telephone Encounter (Signed)
Please refill 90 days colestipol (COLESTID) 1 g tablet  To [facility] [address]

## 2021-04-11 MED ORDER — COLESTIPOL HCL 1 G PO TABS: 2.0000 g | ORAL_TABLET | Freq: Every day | ORAL | 1 refills | Status: DC

## 2021-04-11 NOTE — Telephone Encounter (Signed)
Looks like Rx had been refilled but there was a transmission error... Rx sent to pharmacy as requested and receipt confirmed by pharmacy

## 2021-05-02 DIAGNOSIS — Z79899 Other long term (current) drug therapy: Secondary | ICD-10-CM | POA: Diagnosis not present

## 2021-06-04 DIAGNOSIS — R5381 Other malaise: Secondary | ICD-10-CM | POA: Diagnosis not present

## 2021-06-04 DIAGNOSIS — R82998 Other abnormal findings in urine: Secondary | ICD-10-CM | POA: Diagnosis not present

## 2021-06-04 DIAGNOSIS — G8929 Other chronic pain: Secondary | ICD-10-CM | POA: Diagnosis not present

## 2021-06-04 DIAGNOSIS — R634 Abnormal weight loss: Secondary | ICD-10-CM | POA: Diagnosis not present

## 2021-06-05 ENCOUNTER — Ambulatory Visit
Admission: RE | Admit: 2021-06-05 | Discharge: 2021-06-05 | Disposition: A | Discharge status: Home / Self Care | Payer: Medicare Other | Source: Ambulatory Visit | Attending: Family Medicine | Admitting: Family Medicine

## 2021-06-05 ENCOUNTER — Other Ambulatory Visit: Payer: Self-pay | Admitting: Family Medicine

## 2021-06-05 DIAGNOSIS — E538 Deficiency of other specified B group vitamins: Secondary | ICD-10-CM | POA: Diagnosis not present

## 2021-06-05 DIAGNOSIS — E119 Type 2 diabetes mellitus without complications: Secondary | ICD-10-CM | POA: Diagnosis not present

## 2021-06-05 DIAGNOSIS — R634 Abnormal weight loss: Secondary | ICD-10-CM

## 2021-06-05 DIAGNOSIS — Z1383 Encounter for screening for respiratory disorder NEC: Secondary | ICD-10-CM

## 2021-06-05 DIAGNOSIS — E559 Vitamin D deficiency, unspecified: Secondary | ICD-10-CM | POA: Diagnosis not present

## 2021-06-05 DIAGNOSIS — N390 Urinary tract infection, site not specified: Secondary | ICD-10-CM | POA: Diagnosis not present

## 2021-06-05 DIAGNOSIS — R946 Abnormal results of thyroid function studies: Secondary | ICD-10-CM | POA: Diagnosis not present

## 2021-06-05 DIAGNOSIS — R0602 Shortness of breath: Secondary | ICD-10-CM | POA: Diagnosis not present

## 2021-06-05 DIAGNOSIS — R7989 Other specified abnormal findings of blood chemistry: Secondary | ICD-10-CM | POA: Diagnosis not present

## 2021-06-13 DIAGNOSIS — R634 Abnormal weight loss: Secondary | ICD-10-CM | POA: Diagnosis not present

## 2021-06-18 DIAGNOSIS — N1832 Chronic kidney disease, stage 3b: Secondary | ICD-10-CM | POA: Diagnosis not present

## 2021-06-18 DIAGNOSIS — R634 Abnormal weight loss: Secondary | ICD-10-CM | POA: Diagnosis not present

## 2021-07-03 DIAGNOSIS — E213 Hyperparathyroidism, unspecified: Secondary | ICD-10-CM | POA: Diagnosis not present

## 2021-07-03 DIAGNOSIS — M545 Low back pain, unspecified: Secondary | ICD-10-CM | POA: Diagnosis not present

## 2021-07-03 DIAGNOSIS — Z79899 Other long term (current) drug therapy: Secondary | ICD-10-CM | POA: Diagnosis not present

## 2021-07-03 DIAGNOSIS — R634 Abnormal weight loss: Secondary | ICD-10-CM | POA: Diagnosis not present

## 2021-07-05 DIAGNOSIS — M810 Age-related osteoporosis without current pathological fracture: Secondary | ICD-10-CM | POA: Diagnosis not present

## 2021-12-26 DIAGNOSIS — Z79899 Other long term (current) drug therapy: Secondary | ICD-10-CM | POA: Diagnosis not present

## 2021-12-26 DIAGNOSIS — G8929 Other chronic pain: Secondary | ICD-10-CM | POA: Diagnosis not present

## 2022-01-09 ENCOUNTER — Emergency Department: Payer: Medicare Other

## 2022-01-09 ENCOUNTER — Other Ambulatory Visit: Payer: Self-pay

## 2022-01-09 ENCOUNTER — Emergency Department
Admission: EM | Admit: 2022-01-09 | Discharge: 2022-01-09 | Disposition: A | Discharge status: Home / Self Care | Payer: Medicare Other | Attending: Emergency Medicine | Admitting: Emergency Medicine

## 2022-01-09 DIAGNOSIS — I517 Cardiomegaly: Secondary | ICD-10-CM | POA: Diagnosis not present

## 2022-01-09 DIAGNOSIS — W19XXXA Unspecified fall, initial encounter: Secondary | ICD-10-CM | POA: Insufficient documentation

## 2022-01-09 DIAGNOSIS — R52 Pain, unspecified: Secondary | ICD-10-CM | POA: Insufficient documentation

## 2022-01-09 DIAGNOSIS — M25551 Pain in right hip: Secondary | ICD-10-CM | POA: Diagnosis not present

## 2022-01-09 DIAGNOSIS — I10 Essential (primary) hypertension: Secondary | ICD-10-CM | POA: Diagnosis not present

## 2022-01-09 DIAGNOSIS — Z743 Need for continuous supervision: Secondary | ICD-10-CM | POA: Diagnosis not present

## 2022-01-09 DIAGNOSIS — M25519 Pain in unspecified shoulder: Secondary | ICD-10-CM | POA: Diagnosis not present

## 2022-01-09 DIAGNOSIS — R531 Weakness: Secondary | ICD-10-CM | POA: Diagnosis not present

## 2022-01-09 DIAGNOSIS — M25511 Pain in right shoulder: Secondary | ICD-10-CM | POA: Diagnosis not present

## 2022-01-09 DIAGNOSIS — R296 Repeated falls: Secondary | ICD-10-CM | POA: Diagnosis not present

## 2022-01-09 DIAGNOSIS — S199XXA Unspecified injury of neck, initial encounter: Secondary | ICD-10-CM | POA: Diagnosis not present

## 2022-01-09 DIAGNOSIS — S0990XA Unspecified injury of head, initial encounter: Secondary | ICD-10-CM | POA: Diagnosis not present

## 2022-01-09 LAB — CBC
HCT: 42.3 % (ref 36.0–46.0)
Hemoglobin: 13.6 g/dL (ref 12.0–15.0)
MCH: 28.1 pg (ref 26.0–34.0)
MCHC: 32.2 g/dL (ref 30.0–36.0)
MCV: 87.4 fL (ref 80.0–100.0)
Platelets: 197 10*3/uL (ref 150–400)
RBC: 4.84 MIL/uL (ref 3.87–5.11)
RDW: 13.2 % (ref 11.5–15.5)
WBC: 5.9 10*3/uL (ref 4.0–10.5)
nRBC: 0 % (ref 0.0–0.2)

## 2022-01-09 LAB — URINALYSIS, ROUTINE W REFLEX MICROSCOPIC
Bilirubin Urine: NEGATIVE
Glucose, UA: NEGATIVE mg/dL
Hgb urine dipstick: NEGATIVE
Ketones, ur: NEGATIVE mg/dL
Leukocytes,Ua: NEGATIVE
Nitrite: NEGATIVE
Protein, ur: NEGATIVE mg/dL
Specific Gravity, Urine: 1.01 (ref 1.005–1.030)
pH: 5 (ref 5.0–8.0)

## 2022-01-09 LAB — BASIC METABOLIC PANEL
Anion gap: 6 (ref 5–15)
BUN: 44 mg/dL — ABNORMAL HIGH (ref 8–23)
CO2: 25 mmol/L (ref 22–32)
Calcium: 9.9 mg/dL (ref 8.9–10.3)
Chloride: 106 mmol/L (ref 98–111)
Creatinine, Ser: 1.17 mg/dL — ABNORMAL HIGH (ref 0.44–1.00)
GFR, Estimated: 47 mL/min — ABNORMAL LOW (ref >60)
Glucose, Bld: 107 mg/dL — ABNORMAL HIGH (ref 70–99)
Potassium: 4.6 mmol/L (ref 3.5–5.1)
Sodium: 137 mmol/L (ref 135–145)

## 2022-01-09 LAB — TROPONIN I (HIGH SENSITIVITY): Troponin I (High Sensitivity): 10 ng/L (ref <18)

## 2022-01-09 NOTE — ED Notes (Signed)
Sent rainbow to the lab. 

## 2022-01-09 NOTE — ED Provider Notes (Addendum)
? ?[facility] ?Provider Note ? ? ? Event Date/Time  ? First MD Initiated Contact with Patient 01/09/22 1518   ?  (approximate) ? ? ?History  ? ? ? ?HPI ? ?Amanda [name] is a 81 y.o. female with a history of hypertension, anxiety, chronic opioid use to is brought to the ED today due to multiple falls.  No dizziness or syncope.  She is accompanied by her spouse who notes that she does not fall hard and get injured, but Maurer ends up lowering herself to the ground due to generalized weakness.  She is also been having increased confusion lately. ? ?Patient denies any pain or acute illness or other symptoms. ? ?Family notes that patient is taking multiple sedative medications including gabapentin, Vicodin, Flexeril, and Xanax.  She had run out of these medications for a while and her mental status and energy were better, but they just got refilled and she took them again last night and soon became confused and weak. ? ?She has been eating and drinking normally.  No diarrhea or vomiting or fever. ? ?They also report that she has forgetfulness which is a chronic issue.  She has not been evaluated for dementia.  Family also notes that the patient manages her medications herself, does not use a pillbox but rather keeps all of the medicines mixed up and a single large bottle. ? ?  ? ? ?Physical Exam  ? ?Triage Vital Signs: ?ED Triage Vitals [01/09/22 1238]  ?Enc Vitals Group  ?   BP (!) 142/77  ?   Pulse Rate 65  ?   Resp 17  ?   Temp 98.5 ?F (36.9 ?C)  ?   Temp src   ?   SpO2 95 %  ?   Weight   ?   Height   ?   Head Circumference   ?   Peak Flow   ?   Pain Score   ?   Pain Loc   ?   Pain Edu?   ?   Excl. in GC?   ? ? ?Most recent vital signs: ?Vitals:  ? 01/09/22 1238 01/09/22 1637  ?BP: (!) 142/77 123/74  ?Pulse: 65 67  ?Resp: 17 17  ?Temp: 98.5 ?F (36.9 ?C)   ?SpO2: 95% 96%  ? ? ? ?General: Awake, no distress.  Oriented to person and place ?CV:  Good peripheral perfusion.  Regular rate and  rhythm ?Resp:  Normal effort.  Clear to auscultation bilaterally ?Abd:  No distention.  Soft nontender ?Other:  No wounds or focal bony tenderness.  Head is atraumatic.  No spinal tenderness. ? ? ?ED Results / Procedures / Treatments  ? ?Labs ?(all labs ordered are listed, but only abnormal results are displayed) ?Labs Reviewed  ?BASIC METABOLIC PANEL - Abnormal; Notable for the following components:  ?    Result Value  ? Glucose, Bld 107 (*)   ? BUN 44 (*)   ? Creatinine, Ser 1.17 (*)   ? GFR, Estimated 47 (*)   ? All other components within normal limits  ?URINALYSIS, ROUTINE W REFLEX MICROSCOPIC - Abnormal; Notable for the following components:  ? Color, Urine YELLOW (*)   ? APPearance CLEAR (*)   ? All other components within normal limits  ?CBC  ?CBG MONITORING, ED  ?TROPONIN I (HIGH SENSITIVITY)  ?TROPONIN I (HIGH SENSITIVITY)  ? ? ? ?EKG ? ?Interpreted by me normal sinus rhythm rate of 68.  Normal axis and intervals.  Voltage criteria for LVH in the high lateral leads.  Normal ST segments.  Isolated T wave inversion in lead III which is nonspecific.  No acute ischemic changes. ? ? ?RADIOLOGY ?CT head viewed and interpreted by me, negative for intracranial hemorrhage.  Radiology report reviewed. ? ?CT cervical spine unremarkable. ? ?X-ray chest, right shoulder, right hip and pelvis unremarkable. ? ? ? ?PROCEDURES: ? ?Critical Care performed: No ? ?Procedures ? ? ?MEDICATIONS ORDERED IN ED: ?Medications - No data to display ? ? ?IMPRESSION / MDM / ASSESSMENT AND PLAN / ED COURSE  ?I reviewed the triage vital signs and the nursing notes. ?             ?               ? ?Differential diagnosis includes, but is not limited to, intracranial hemorrhage, C-spine injury, pneumonia, UTI, polypharmacy, dehydration, electrolyte abnormality ? ?Patient presents with episodes of confusion, generalized weakness causing her to be unsteady and gently fell to the floor. ?It sounds like she may have some underlying degree of  dementia and would benefit from neurology evaluation. ? ?I suspect most likely this is due to polypharmacy with multiple beers criteria medications.  Will recommend removing several of these medicines. ? ?Imaging and labs are all unremarkable.  Patient is roughly mental status baseline per family, maintaining airway, not requiring admission. ? ? ? ? ?Clinical Course as of 01/09/22 1835  ?Thu Jan 09, 2022  ?1834 Work-up all unremarkable and reassuring.  Patient and family counseled on concern for polypharmacy, recommend discontinuing Flexeril and gabapentin for now and gently decreasing Xanax until patient can follow-up. [PS]  ?  ?Clinical Course User Index ?[PS] Sharman Cheek, MD  ? ? ? ?FINAL CLINICAL IMPRESSION(S) / ED DIAGNOSES  ? ?Final diagnoses:  ?Pain  ? ? ? ?Rx / DC Orders  ? ?ED Discharge Orders   ? ? None  ? ?  ? ? ? ?Note:  This document was prepared using Dragon voice recognition software and may include unintentional dictation errors. ?  ?Sharman Cheek, MD ?01/09/22 1738 ? ?  ?Sharman Cheek, MD ?01/09/22 1740 ? ?

## 2022-01-09 NOTE — ED Triage Notes (Signed)
Pt comes into the ED via EMS from home with c/o having multiple falls , right leg and shoulder pain, having issues with her balance, they had to get her up off the kitchen floor , pt is not able to ambulate. ?A/o lives with husband and family ? ?96%RA ?137/83 ?129/94 ?HR72 ? ?

## 2022-01-09 NOTE — ED Notes (Signed)
Per family, pt had a unwitnessed fall last night. Pt states she bumped her head. Pt is only c/o pain in her back. Pt does not take any blood thinners.  ?

## 2022-01-09 NOTE — ED Notes (Signed)
Pt verbalized understanding of discharge instructions and follow-up care instructions. Pt advised if symptoms worsen to return to ED. E-signature not available due to no e-signature pad available.  ?

## 2022-01-09 NOTE — Discharge Instructions (Addendum)
Please discontinue taking your muscle relaxer (Flexeril / cyclobenzaprine).   ? ?You should also discontinue gabapentin for now.   ? ?If possible, try to cut back on your Xanax (alprazolam) too.  This should be done gradually due to the possibility of withdrawal symptoms. Start by decreasing to 0.5mg  three times a day until you follow up with primary care. ? ?Please make an appointment with neurology for further evaluation of your symptoms as well. ?

## 2022-01-16 DIAGNOSIS — M25511 Pain in right shoulder: Secondary | ICD-10-CM | POA: Diagnosis not present

## 2022-01-16 DIAGNOSIS — Z7689 Persons encountering health services in other specified circumstances: Secondary | ICD-10-CM | POA: Diagnosis not present

## 2022-01-16 DIAGNOSIS — M5416 Radiculopathy, lumbar region: Secondary | ICD-10-CM | POA: Diagnosis not present

## 2022-01-16 DIAGNOSIS — T50905A Adverse effect of unspecified drugs, medicaments and biological substances, initial encounter: Secondary | ICD-10-CM | POA: Diagnosis not present

## 2022-01-16 DIAGNOSIS — Z0000 Encounter for general adult medical examination without abnormal findings: Secondary | ICD-10-CM | POA: Diagnosis not present

## 2022-01-16 DIAGNOSIS — E213 Hyperparathyroidism, unspecified: Secondary | ICD-10-CM | POA: Diagnosis not present

## 2022-01-16 DIAGNOSIS — I10 Essential (primary) hypertension: Secondary | ICD-10-CM | POA: Diagnosis not present

## 2022-01-16 DIAGNOSIS — Z1389 Encounter for screening for other disorder: Secondary | ICD-10-CM | POA: Diagnosis not present

## 2022-01-16 DIAGNOSIS — R296 Repeated falls: Secondary | ICD-10-CM | POA: Diagnosis not present

## 2022-01-16 DIAGNOSIS — R410 Disorientation, unspecified: Secondary | ICD-10-CM | POA: Diagnosis not present

## 2022-01-22 DIAGNOSIS — G8929 Other chronic pain: Secondary | ICD-10-CM | POA: Diagnosis not present

## 2022-02-20 DIAGNOSIS — I10 Essential (primary) hypertension: Secondary | ICD-10-CM | POA: Diagnosis not present

## 2022-02-20 DIAGNOSIS — N1831 Chronic kidney disease, stage 3a: Secondary | ICD-10-CM | POA: Diagnosis not present

## 2022-02-20 DIAGNOSIS — R739 Hyperglycemia, unspecified: Secondary | ICD-10-CM | POA: Diagnosis not present

## 2022-02-20 DIAGNOSIS — R202 Paresthesia of skin: Secondary | ICD-10-CM | POA: Diagnosis not present

## 2022-02-20 DIAGNOSIS — R8290 Unspecified abnormal findings in urine: Secondary | ICD-10-CM | POA: Diagnosis not present

## 2022-02-27 DIAGNOSIS — I10 Essential (primary) hypertension: Secondary | ICD-10-CM | POA: Diagnosis not present

## 2022-02-27 DIAGNOSIS — M5416 Radiculopathy, lumbar region: Secondary | ICD-10-CM | POA: Diagnosis not present

## 2022-02-27 DIAGNOSIS — N179 Acute kidney failure, unspecified: Secondary | ICD-10-CM | POA: Diagnosis not present

## 2022-02-27 DIAGNOSIS — M810 Age-related osteoporosis without current pathological fracture: Secondary | ICD-10-CM | POA: Diagnosis not present

## 2022-02-27 DIAGNOSIS — E213 Hyperparathyroidism, unspecified: Secondary | ICD-10-CM | POA: Diagnosis not present

## 2022-02-27 DIAGNOSIS — M19011 Primary osteoarthritis, right shoulder: Secondary | ICD-10-CM | POA: Diagnosis not present

## 2022-03-18 ENCOUNTER — Other Ambulatory Visit: Payer: Self-pay | Admitting: Student in an Organized Health Care Education/Training Program

## 2022-03-18 ENCOUNTER — Ambulatory Visit
Payer: Medicare Other | Attending: Student in an Organized Health Care Education/Training Program | Admitting: Student in an Organized Health Care Education/Training Program

## 2022-03-18 ENCOUNTER — Encounter: Payer: Self-pay | Admitting: Student in an Organized Health Care Education/Training Program

## 2022-03-18 VITALS — BP 154/87 | HR 80 | Temp 97.3°F | Resp 16 | Ht 62.0 in | Wt 185.0 lb

## 2022-03-18 DIAGNOSIS — M5416 Radiculopathy, lumbar region: Secondary | ICD-10-CM | POA: Diagnosis not present

## 2022-03-18 DIAGNOSIS — M47816 Spondylosis without myelopathy or radiculopathy, lumbar region: Secondary | ICD-10-CM

## 2022-03-18 DIAGNOSIS — G8929 Other chronic pain: Secondary | ICD-10-CM

## 2022-03-18 DIAGNOSIS — Z96643 Presence of artificial hip joint, bilateral: Secondary | ICD-10-CM | POA: Diagnosis not present

## 2022-03-18 DIAGNOSIS — M48062 Spinal stenosis, lumbar region with neurogenic claudication: Secondary | ICD-10-CM | POA: Diagnosis not present

## 2022-03-18 DIAGNOSIS — G894 Chronic pain syndrome: Secondary | ICD-10-CM

## 2022-03-18 DIAGNOSIS — M961 Postlaminectomy syndrome, not elsewhere classified: Secondary | ICD-10-CM | POA: Diagnosis not present

## 2022-03-18 DIAGNOSIS — M25512 Pain in left shoulder: Secondary | ICD-10-CM | POA: Diagnosis not present

## 2022-03-18 DIAGNOSIS — M25511 Pain in right shoulder: Secondary | ICD-10-CM | POA: Diagnosis not present

## 2022-03-18 MED ORDER — CELECOXIB 100 MG PO CAPS
100.0000 mg | ORAL_CAPSULE | Freq: Two times a day (BID) | ORAL | 0 refills | Status: AC
Start: 2022-03-18 — End: 2022-04-02

## 2022-03-18 MED ORDER — PREGABALIN 25 MG PO CAPS
ORAL_CAPSULE | ORAL | 0 refills | Status: DC
Start: 2022-03-18 — End: 2022-05-22

## 2022-03-18 NOTE — Assessment & Plan Note (Signed)
Reviewed MRI with patient in great detail from 2020.  Multilevel spinal canal stenosis, moderate to severe.  Discussed home PT exercises, addition of Lyrica as below and consideration of lumbar epidural steroid injection ?

## 2022-03-18 NOTE — Assessment & Plan Note (Signed)
Consider diagnostic lumbar facet medial branch nerve blocks ?

## 2022-03-18 NOTE — Assessment & Plan Note (Signed)
Stable

## 2022-03-18 NOTE — Assessment & Plan Note (Signed)
Consider caudal ESI ?

## 2022-03-18 NOTE — Assessment & Plan Note (Signed)
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 ?

## 2022-03-18 NOTE — Patient Instructions (Signed)
1.  Celebrex 100 mg twice a day for the next 14 days.  Please be sure to take this after a meal and drink plenty of water. ? ?2.  Start Lyrica as prescribed. ? ?3.  We discussed a lumbar epidural steroid injection for her low back pain ? ?4.  We will obtain a urine toxicology screen before we consider low-dose hydrocodone in the future. ? ?5.  Follow-up in 3 weeks ?

## 2022-03-18 NOTE — Assessment & Plan Note (Signed)
Has tried and failed gabapentin.  Short trial of Celebrex as below.  Addition of Lyrica as well.  Has found benefit with hydrocodone 5 mg twice daily as needed.  Will obtain urine toxicology screen today and consider addition at next visit. ?

## 2022-03-18 NOTE — Progress Notes (Signed)
[name]  Service Category: E/M  Provider: Edward JollyBilal Dago Jungwirth, MD  ?DOB: 04/08/1941  DOS: 03/18/2022  Referring Provider: Enid BaasKalisetti, Radhika, MD  ?MRN: [id number]  Setting: Ambulatory outpatient  PCP: Carmon GinsbergPa, [facility]  ?Type: New Patient  Specialty: Interventional Pain Management    ?Location: Office  Delivery: Face-to-face    ? ?Primary Reason(s) for Visit: Encounter for initial evaluation of one or more chronic problems (new to examiner) potentially causing chronic pain, and posing a threat to normal musculoskeletal function. (Level of risk: High) ?CC: Shoulder Pain (Bilateral; LEFT is worse) and Back Pain (mid) ? ?HPI  ?Amanda [name] is a 81 y.o. year old, female patient, who comes for the first time to our practice referred by Enid BaasKalisetti, Radhika, MD for our initial evaluation of her chronic pain. She has Absolute anemia; Acute bronchitis; Fracture of styloid process of radius; Bergmann's syndrome; Diarrhea; Primary localized osteoarthrosis of left hip; History of bilateral hip replacements; Dyspnea; Hypertensive disorder; Pre-op evaluation; Chronic pain of both shoulders; Failed back surgical syndrome; Spinal stenosis of lumbar region with neurogenic claudication; Lumbar facet arthropathy; Chronic radicular lumbar pain; and Chronic pain syndrome on their problem list. Today she comes in for evaluation of her Shoulder Pain (Bilateral; LEFT is worse) and Back Pain (mid) ? ?Pain Assessment: ?Location: Right, Left Shoulder ?Radiating: denies ?Onset: More than a month ago ?Duration: Chronic pain ?Quality: Throbbing, Constant ?Severity: 10-Worst pain ever/10 (subjective, self-reported pain score)  ?Effect on ADL: limits daily activities ?Timing: Constant ?Modifying factors: nothing ?BP: (!) 154/87  HR: 80 ? ?Onset and Duration: Gradual and Present longer than 3 months ?Cause of pain: Unknown ?Severity: No change since onset, NAS-11 at its worse: 8/10, NAS-11 at its best: 5/10, NAS-11 now: 7/10, and NAS-11  on the average: 7/10 ?Timing: Not influenced by the time of the day ?Aggravating Factors: Surgery made it worse ?Alleviating Factors: Lying down and Resting ?Associated Problems: Fatigue and Pain that wakes patient up ?Quality of Pain: Annoying, Deep, and Nagging ?Previous Examinations or Tests: Bone scan ?Previous Treatments: Narcotic medications, Physical Therapy, Steroid treatments by mouth, Strengthening exercises, and Stretching exercises ? ?Amanda [name] is a pleasant 81 year old female who presents with a chief complaint of low back pain as well as bilateral shoulder pain, right greater than left.  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  She states that her mid back pain does not really radiate.  She describes it as throbbing in nature.  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  She has tried oxycodone 5 mg twice a day in the past as well as hydrocodone 5 mg twice daily as needed in the past which she states were beneficial.  She has tried gabapentin 600 mg twice a day in the past with limited response.  She has limited ability walking.  She has significant kyphosis.  She has never had any spinal injections done.  She has a history of bilateral hip replacements. ? ?Historic Controlled Substance Pharmacotherapy Review  ?PMP and historical list of controlled substances: Previously found benefit with hydrocodone 5 mg twice daily as needed ?Historical Monitoring: ?The patient  reports no history of drug use. ?List of all UDS Test(s): ?No results found for: MDMA, COCAINSCRNUR, PCPSCRNUR, PCPQUANT, CANNABQUANT, THCU, ETH ?List of other Serum/Urine Drug Screening Test(s):  ?No results found for: AMPHSCRSER, BARBSCRSER, BENZOSCRSER, COCAINSCRSER, COCAINSCRNUR, PCPSCRSER, PCPQUANT, THCSCRSER, THCU, CANNABQUANT, OPIATESCRSER, OXYSCRSER,  PROPOXSCRSER,  ETH ?Historical Background Evaluation: ?[place] PMP: PDMP reviewed during this encounter. Online review of the past 14-month period conducted.             ?[place] Department of public safety, offender search: Engineer, mining Information) Non-contributory ?Risk Assessment Profile: ?Aberrant behavior: None observed or detected today ?Risk factors for fatal opioid overdose: None identified today ?Fatal overdose hazard ratio (HR): Calculation deferred ?Non-fatal overdose hazard ratio (HR): Calculation deferred ?Risk of opioid abuse or dependence: 0.7-3.0% with doses ? 36 MME/day and 6.1-26% with doses ? 120 MME/day. ?Substance use disorder (SUD) risk level: See below ?Personal History of Substance Abuse (SUD-Substance use disorder):  ?Alcohol: Negative  ?Illegal Drugs:    ?Rx Drugs: Negative  ?ORT Risk Level calculation: Low Risk ? Opioid Risk Tool - 03/18/22 1010   ? ?  ? Family History of Substance Abuse  ? Alcohol Negative   ? Illegal Drugs Negative   ? Rx Drugs Negative   ?  ? Personal History of Substance Abuse  ? Alcohol Negative   ? Rx Drugs Negative   ?  ? Age  ? Age between 16-45 years  No   ?  ? Psychological Disease  ? Psychological Disease Negative   ? Depression Negative   ?  ? Total Score  ? Opioid Risk Tool Scoring 0   ? Opioid Risk Interpretation Low Risk   ? ?  ?  ? ?  ? ?ORT Scoring interpretation table:  ?Score <3 = Low Risk for SUD  ?Score between 4-7 = Moderate Risk for SUD  ?Score >8 = High Risk for Opioid Abuse  ? ?PHQ-2 Depression Scale:  Total score:   ? ?PHQ-2 Scoring interpretation table: (Score and probability of major depressive disorder)  ?Score 0 = No depression  ?Score 1 = 15.4% Probability  ?Score 2 = 21.1% Probability  ?Score 3 = 38.4% Probability  ?Score 4 = 45.5% Probability  ?Score 5 = 56.4% Probability  ?Score 6 = 78.6% Probability  ? ?PHQ-9 Depression Scale:  Total score:   ? ?PHQ-9 Scoring interpretation table:  ?Score 0-4 = No depression  ?Score 5-9 = Mild depression  ?Score 10-14 = Moderate  depression  ?Score 15-19 = Moderately severe depression  ?Score 20-27 = Severe depression (2.4 times higher risk of SUD and 2.89 times higher risk of overuse)  ? ?Pharmacologic Plan: As per protocol, I have not taken over any controlled substance management, pending the results of ordered tests and/or consults.            ?Initial impression: Pending review of available data and ordered tests. ? ?Meds  ? ?Current Outpatient Medications:  ?  carvedilol (COREG) 6.25 MG tablet, Take 6.25 mg by mouth daily. , Disp: , Rfl:  ?  celecoxib (CELEBREX) 100 MG capsule, Take 1 capsule (100 mg total) by mouth 2 (two) times daily for 15 days., Disp: 30 capsule, Rfl: 0 ?  cetirizine (ZYRTEC) 10 MG tablet, Take 10 mg by mouth daily., Disp: , Rfl:  ?  Cholecalciferol 25 MCG (1000 UT) CHEW, Chew 1 tablet by mouth daily., Disp: , Rfl:  ?  Cyanocobalamin (B-12 PO), Take 1 capsule by mouth daily., Disp: , Rfl:  ?  diphenoxylate-atropine (LOMOTIL) 2.5-0.025 MG tablet, Take 2 tablets by mouth 4 (four) times daily as needed for diarrhea or loose stools., Disp: , Rfl:  ?  lipase/protease/amylase (CREON) 36000 UNITS CPEP capsule, Take 36,000 Units by mouth 3 (three) times daily with meals., Disp: , Rfl:  ?  meclizine (ANTIVERT) 12.5 MG tablet, Take 1 tablet by mouth daily., Disp: , Rfl:  ?  oxybutynin (DITROPAN) 5 MG tablet, Take 5 mg by mouth 2 (two) times daily., Disp: , Rfl:  ?  pregabalin (LYRICA) 25 MG capsule, Take 1 capsule (25 mg total) by mouth at bedtime for 15 days, THEN 1 capsule (25 mg total) 2 (two) times daily., Disp: 75 capsule, Rfl: 0 ?  triamterene-hydrochlorothiazide (MAXZIDE-25) 37.5-25 MG tablet, Take 1 tablet by mouth daily., Disp: , Rfl:  ?  colestipol (COLESTID) 1 g tablet, Take 2 tablets (2 g total) by mouth daily., Disp: 180 tablet, Rfl: 1 ? ?Imaging Review  ?C ? ?Narrative ?CLINICAL DATA:  Head trauma, minor (Age >= 65y); Neck trauma (Age >= ?65y) ? ?EXAM: ?CT HEAD WITHOUT CONTRAST ? ?CT CERVICAL SPINE WITHOUT  CONTRAST ? ?TECHNIQUE: ?Multidetector CT imaging of the head and cervical spine was ?performed following the standard protocol without intravenous ?contrast. Multiplanar CT image reconstructions of the cervical spine ?wer

## 2022-03-18 NOTE — Assessment & Plan Note (Signed)
Consider glenohumeral joint injection, suprascapular nerve block ?

## 2022-03-18 NOTE — Progress Notes (Signed)
Safety precautions to be maintained throughout the outpatient stay will include: orient to surroundings, keep bed in low position, maintain call bell within reach at all times, provide assistance with transfer out of bed and ambulation.  

## 2022-03-25 LAB — COMPLIANCE DRUG ANALYSIS, UR

## 2022-04-02 DIAGNOSIS — M810 Age-related osteoporosis without current pathological fracture: Secondary | ICD-10-CM | POA: Diagnosis not present

## 2022-04-07 NOTE — Progress Notes (Unsigned)
PROVIDER NOTE: Information contained herein reflects review and annotations entered in association with encounter. Interpretation of such information and data should be left to medically-trained personnel. Information provided to patient can be located elsewhere in the medical record under "Patient Instructions". Document created using STT-dictation technology, any transcriptional errors that may result from process are unintentional.    [name]  Service Category: E/M  Provider: Oswaldo Done, MD  DOB: 09/17/1941  DOS: 04/09/2022  Specialty: Interventional Pain Management  MRN: [id number]  Setting: Ambulatory outpatient  PCP: [facility]  Type: Established Patient    Referring Provider: [facility]  Location: Office  Delivery: Face-to-face     HPI  Amanda [name], a 81 y.o. year old female, is here today because of her No primary diagnosis found.. Amanda [name] primary complain today is No chief complaint on file. Last encounter: My last encounter with her was on Visit date not found. Pertinent problems: Amanda [name] does not have any pertinent problems on file. Pain Assessment: Severity of   is reported as a  /10. Location:    / . Onset:  . Quality:  . Timing:  . Modifying factor(s):  Marland Kitchen Vitals:  vitals were not taken for this visit.   Reason for encounter:  *** . ***  Pharmacotherapy Assessment  Analgesic: {There is no content from the last Subjective section.}   Monitoring: [place] PMP: PDMP reviewed during this encounter.       Pharmacotherapy: No side-effects or adverse reactions reported. Compliance: No problems identified. Effectiveness: Clinically acceptable.  No notes on file  UDS:  Summary  Date Value Ref Range Status  03/18/2022 Note  Final    Comment:    ==================================================================== Compliance Drug Analysis, Ur ==================================================================== Test                              Result       Flag       Units  Drug Present not Declared for Prescription Verification   Temazepam                      50           UNEXPECTED ng/mg creat    Temazepam may be administered as a scheduled prescription medication;    it is also an expected metabolite of diazepam.    Hydrocodone                    1034         UNEXPECTED ng/mg creat   Norhydrocodone                 1308         UNEXPECTED ng/mg creat    Sources of hydrocodone include scheduled prescription medications.    Norhydrocodone is an expected metabolite of hydrocodone.    Gabapentin                     PRESENT      UNEXPECTED   Cyclobenzaprine                PRESENT      UNEXPECTED   Desmethylcyclobenzaprine       PRESENT      UNEXPECTED    Desmethylcyclobenzaprine is an expected metabolite of    cyclobenzaprine.    Citalopram  PRESENT      UNEXPECTED   Desmethylcitalopram            PRESENT      UNEXPECTED    Desmethylcitalopram is an expected metabolite of citalopram or the    enantiomeric form, escitalopram.    Duloxetine                     PRESENT      UNEXPECTED   Acetaminophen                  PRESENT      UNEXPECTED  Drug Absent but Declared for Prescription Verification   Pregabalin                     Not Detected UNEXPECTED ==================================================================== Test                      Result    Flag   Units      Ref Range   Creatinine              80               mg/dL      >=40 ==================================================================== Declared Medications:  The flagging and interpretation on this report are based on the  following declared medications.  Unexpected results may arise from  inaccuracies in the declared medications.   **Note: The testing scope of this panel includes these medications:   Pregabalin (Lyrica)   **Note: The testing scope of this panel does not include the  following reported  medications:   Atropine (Lomotil)  Carvedilol (Coreg)  Celecoxib (Celebrex)  Cetirizine (Zyrtec)  Cholecalciferol  Colestipol (Colestid)  Cyanocobalamin  Diphenoxylate (Lomotil)  Hydrochlorothiazide (Maxzide)  Meclizine (Antivert)  Oxybutynin (Ditropan)  Pancrelipase (Creon)  Triamterene (Maxzide) ==================================================================== For clinical consultation, please call [phone]. ====================================================================      ROS  Constitutional: Denies any fever or chills Gastrointestinal: No reported hemesis, hematochezia, vomiting, or acute GI distress Musculoskeletal: Denies any acute onset joint swelling, redness, loss of ROM, or weakness Neurological: No reported episodes of acute onset apraxia, aphasia, dysarthria, agnosia, amnesia, paralysis, loss of coordination, or loss of consciousness  Medication Review  Cholecalciferol, Cyanocobalamin, carvedilol, cetirizine, colestipol, diphenoxylate-atropine, lipase/protease/amylase, meclizine, oxybutynin, pregabalin, and triamterene-hydrochlorothiazide  History Review  Allergy: Amanda [name] is allergic to other, codeine, and penicillins. Drug: Amanda [name]  reports no history of drug use. Alcohol:  reports no history of alcohol use. Tobacco:  reports that she has quit smoking. Her smoking use included cigarettes. She has a 30.00 pack-year smoking history. She has never used smokeless tobacco. Social: Amanda [name]  reports that she has quit smoking. Her smoking use included cigarettes. She has a 30.00 pack-year smoking history. She has never used smokeless tobacco. She reports that she does not drink alcohol and does not use drugs. Medical:  has a past medical history of Anxiety, Asthma, Complication of anesthesia, GI bleed, Hiatal hernia, Hypertension, and IDA (iron deficiency anemia). 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 Family: family history is not on file.  Laboratory Chemistry Profile   Renal Lab Results  Component Value Date   BUN 44 (H) 01/09/2022   CREATININE 1.17 (H) 01/09/2022   GFRAA 47 (L) 07/03/2019   GFRNONAA 47 (L) 01/09/2022    Hepatic Lab Results  Component Value Date   AST 29 07/03/2019   ALT 13 07/03/2019   ALBUMIN 4.3 07/03/2019   ALKPHOS 74 07/03/2019   LIPASE 160 12/13/2013    Electrolytes Lab Results  Component Value Date   NA 137 01/09/2022   K 4.6 01/09/2022   CL 106 01/09/2022   CALCIUM 9.9 01/09/2022   MG 2.0 02/05/2015    Bone No results found for: VD25OH, VD125OH2TOT, BM8413KG4, WN0272ZD6, 25OHVITD1, 25OHVITD2, 25OHVITD3, TESTOFREE, TESTOSTERONE  Inflammation (CRP: Acute Phase) (ESR: Chronic Phase) Lab Results  Component Value Date   ESRSEDRATE 54 (H) 02/18/2018         Note: Above Lab results reviewed.  Recent Imaging Review  CT Cervical Spine Wo Contrast CLINICAL DATA:  Head trauma, minor (Age >= 65y); Neck trauma (Age >= 65y)  EXAM: CT HEAD WITHOUT CONTRAST  CT CERVICAL SPINE WITHOUT CONTRAST  TECHNIQUE: Multidetector CT imaging of the head and cervical spine was performed following the standard protocol without intravenous contrast. Multiplanar CT image reconstructions of the cervical spine were also generated.  RADIATION DOSE REDUCTION: This exam was performed according to the departmental dose-optimization program which includes automated exposure control, adjustment of the mA and/or kV according to patient  size and/or use of iterative reconstruction technique.  COMPARISON:  Mar 18, 2013  FINDINGS: CT HEAD FINDINGS  Brain: No evidence of acute infarction, hemorrhage, hydrocephalus, extra-axial collection or mass lesion/mass effect. Periventricular white matter hypodensities consistent with sequela of chronic microvascular ischemic disease. Global parenchymal volume loss. Remote LEFT insular lacunar infarction.  Vascular: Vascular calcifications of the carotid siphons.  Skull: Normal. Negative for fracture or focal lesion.  Sinuses/Orbits: No acute finding.  Other: None.  CT CERVICAL SPINE FINDINGS  Alignment: Normal.  Skull base and vertebrae: Osteopenia.  No acute fracture.  Soft tissues and spinal canal: No prevertebral fluid or swelling. No visible canal hematoma.  Disc levels: Multilevel facet arthropathy and uncovertebral hypertrophy results in multilevel osseous neuroforaminal narrowing, most pronounced at LEFT C4-5 and bilateral C5-6.  Upper chest: Mild debris in the trachea.  Other: Atherosclerotic calcifications.  IMPRESSION: 1.  No acute intracranial abnormality. 2.  No acute fracture or static subluxation of the cervical spine.  Electronically Signed   By: Meda Klinefelter M.D.   On: 01/09/2022 15:53 CT Head Wo Contrast CLINICAL DATA:  Head trauma, minor (Age >= 65y); Neck trauma (Age >= 65y)  EXAM: CT HEAD WITHOUT CONTRAST  CT CERVICAL SPINE WITHOUT CONTRAST  TECHNIQUE: Multidetector CT imaging of the head and cervical spine was performed following the standard protocol without intravenous contrast. Multiplanar CT image reconstructions of the cervical spine were also generated.  RADIATION DOSE REDUCTION: This exam was performed according to the departmental dose-optimization program which includes automated exposure control, adjustment of the mA and/or kV according to patient size and/or use of iterative reconstruction technique.  COMPARISON:   Mar 18, 2013  FINDINGS: CT HEAD FINDINGS  Brain: No evidence of acute infarction, hemorrhage, hydrocephalus, extra-axial collection or mass lesion/mass effect. Periventricular white matter hypodensities consistent with sequela of chronic microvascular ischemic disease. Global parenchymal volume loss. Remote LEFT insular lacunar infarction.  Vascular: Vascular calcifications of the carotid siphons.  Skull: Normal. Negative for fracture or focal lesion.  Sinuses/Orbits: No acute finding.  Other: None.  CT CERVICAL SPINE FINDINGS  Alignment: Normal.  Skull base and vertebrae: Osteopenia.  No acute fracture.  Soft tissues and spinal canal: No prevertebral fluid or swelling. No visible canal hematoma.  Disc levels: Multilevel facet arthropathy and uncovertebral hypertrophy results in multilevel osseous neuroforaminal narrowing, most pronounced at LEFT C4-5 and bilateral C5-6.  Upper chest: Mild debris in the trachea.  Other: Atherosclerotic calcifications.  IMPRESSION: 1.  No acute intracranial abnormality. 2.  No acute fracture or static subluxation of the cervical spine.  Electronically Signed   By: Meda Klinefelter M.D.   On: 01/09/2022 15:53 DG Chest 1 View CLINICAL DATA:  Weakness.  EXAM: CHEST  1 VIEW  COMPARISON:  June 05, 2021.  FINDINGS: Mild cardiomegaly is noted. Possible mild bibasilar pulmonary edema is noted. Bony thorax is unremarkable.  IMPRESSION: Possible mild bibasilar pulmonary edema.  Electronically Signed   By: Lupita Raider M.D.   On: 01/09/2022 15:07 DG Hip Unilat W or Wo Pelvis 2-3 Views Right CLINICAL DATA:  RIGHT hip pain, frequent falls  EXAM: DG HIP (WITH OR WITHOUT PELVIS) 2-3V RIGHT  COMPARISON:  None  FINDINGS: Osseous demineralization.  BILATERAL hip prostheses.  SI joints preserved.  No acute fracture, dislocation, or bone destruction.  Degenerative disc disease changes lower lumbar  spine.  IMPRESSION: Osseous demineralization with BILATERAL hip prostheses and lumbar degenerative disc disease changes.  No acute abnormalities.  Electronically Signed   By: Ulyses Southward M.D.   On: 01/09/2022 12:39 DG Shoulder Right CLINICAL DATA:  Frequent falls, RIGHT shoulder pain  EXAM: RIGHT SHOULDER - 2+ VIEW  COMPARISON:  None  FINDINGS: Osseous demineralization.  Glenohumeral joint space narrowing and spur formation.  AC joint alignment normal.  No acute fracture, dislocation, or bone destruction.  Deformities from old fractures of lateral RIGHT fourth and fifth ribs.  IMPRESSION: RIGHT glenohumeral degenerative changes and osseous demineralization.  No acute abnormalities.  Electronically Signed   By: Ulyses Southward M.D.   On: 01/09/2022 12:36 Note: Reviewed        Physical Exam  General appearance: Well nourished, well developed, and well hydrated. In no apparent acute distress Mental status: Alert, oriented x 3 (person, place, & time)       Respiratory: No evidence of acute respiratory distress Eyes: PERLA Vitals: There were no vitals taken for this visit. BMI: Estimated body mass index is 33.84 kg/m as calculated from the following:   Height as of 03/18/22: 5\' 2"  (1.575 m).   Weight as of 03/18/22: 185 lb (83.9 kg). Ideal: Patient weight not recorded  Assessment   Diagnosis Status  No diagnosis found. Controlled Controlled Controlled   Updated Problems: No problems updated.  Plan of Care  Problem-specific:  No problem-specific Assessment & Plan notes found for this encounter.  Ms. Zuriah Ehrgott has a current medication list which includes the following long-term medication(s): carvedilol, cetirizine, colestipol, pregabalin, and triamterene-hydrochlorothiazide.  Pharmacotherapy (Medications Ordered): No orders of the defined types were placed in this encounter.  Orders:  No orders of the defined types were placed in this  encounter.  Follow-up plan:   No follow-ups on file.     {There is no content from the last Plan section.}   Recent Visits Date Type Provider Dept  03/18/22 Office Visit Edward Jolly, MD [facility]  Showing recent visits within past 90 days and meeting all other requirements Future Appointments Date Type Provider Dept  04/09/22 Appointment Delano Metz, MD [facility]  Showing future appointments within next 90 days  and meeting all other requirements  I discussed the assessment and treatment plan with the patient. The patient was provided an opportunity to ask questions and all were answered. The patient agreed with the plan and demonstrated an understanding of the instructions.  Patient advised to call back or seek an in-person evaluation if the symptoms or condition worsens.  Duration of encounter: *** minutes.  Note by: Oswaldo Done, MD Date: 04/09/2022; Time: 3:22 PM

## 2022-04-09 ENCOUNTER — Encounter: Payer: Self-pay | Admitting: Student in an Organized Health Care Education/Training Program

## 2022-04-09 ENCOUNTER — Ambulatory Visit
Payer: Medicare Other | Attending: Pain Medicine | Admitting: Student in an Organized Health Care Education/Training Program

## 2022-04-09 VITALS — BP 128/90 | HR 83 | Temp 97.3°F | Resp 18 | Ht 62.0 in | Wt 175.0 lb

## 2022-04-09 DIAGNOSIS — M5416 Radiculopathy, lumbar region: Secondary | ICD-10-CM | POA: Diagnosis not present

## 2022-04-09 DIAGNOSIS — G894 Chronic pain syndrome: Secondary | ICD-10-CM | POA: Diagnosis not present

## 2022-04-09 DIAGNOSIS — Z0289 Encounter for other administrative examinations: Secondary | ICD-10-CM | POA: Diagnosis not present

## 2022-04-09 DIAGNOSIS — M25511 Pain in right shoulder: Secondary | ICD-10-CM | POA: Diagnosis not present

## 2022-04-09 DIAGNOSIS — G8929 Other chronic pain: Secondary | ICD-10-CM | POA: Diagnosis not present

## 2022-04-09 DIAGNOSIS — M25512 Pain in left shoulder: Secondary | ICD-10-CM | POA: Insufficient documentation

## 2022-04-09 DIAGNOSIS — M48062 Spinal stenosis, lumbar region with neurogenic claudication: Secondary | ICD-10-CM | POA: Diagnosis not present

## 2022-04-09 DIAGNOSIS — M961 Postlaminectomy syndrome, not elsewhere classified: Secondary | ICD-10-CM | POA: Insufficient documentation

## 2022-04-09 DIAGNOSIS — Z91148 Patient's other noncompliance with medication regimen for other reason: Secondary | ICD-10-CM | POA: Insufficient documentation

## 2022-04-09 MED ORDER — HYDROCODONE-ACETAMINOPHEN 5-325 MG PO TABS: 1.0000 | ORAL_TABLET | Freq: Two times a day (BID) | ORAL | 0 refills | Status: DC

## 2022-04-09 MED ORDER — HYDROCODONE-ACETAMINOPHEN 5-325 MG PO TABS: 1.0000 | ORAL_TABLET | Freq: Two times a day (BID) | ORAL | 0 refills | Status: AC

## 2022-04-09 NOTE — Progress Notes (Signed)
PROVIDER NOTE: Information contained herein reflects review and annotations entered in association with encounter. Interpretation of such information and data should be left to medically-trained personnel. Information provided to patient can be located elsewhere in the medical record under "Patient Instructions". Document created using STT-dictation technology, any transcriptional errors that may result from process are unintentional.    [name]  Service Category: E/M  Provider: Gillis Santa, MD  DOB: 1940/12/03  DOS: 04/09/2022  Specialty: Interventional Pain Management  MRN: [id number]  Setting: Ambulatory outpatient  PCP: Gladstone Lighter, MD  Type: Established Patient    Referring Provider: [facility]  Location: Office  Delivery: Face-to-face     Primary Reason(s) for Visit: Encounter for evaluation before starting new chronic pain management plan of care (Level of risk: moderate) CC: Back Pain (lower)  HPI  Amanda [name] is a 81 y.o. year old, female patient, who comes today for a follow-up evaluation to review the test results and decide on a treatment plan. She has Absolute anemia; Acute bronchitis; Fracture of styloid process of radius; Bergmann's syndrome; Diarrhea; Primary localized osteoarthrosis of left hip; History of bilateral hip replacements; Dyspnea; Hypertensive disorder; Pre-op evaluation; Chronic pain of both shoulders; Failed back surgical syndrome; Spinal stenosis of lumbar region with neurogenic claudication; Lumbar facet arthropathy; Chronic radicular lumbar pain; Chronic pain syndrome; and Pain management contract signed on their problem list. Her primarily concern today is the Back Pain (lower)  Pain Assessment: Location: Lower Back Radiating: denies Onset: More than a month ago Duration: Chronic pain Quality: Other (Comment) (agonizing) Severity: 8 /10 (subjective, self-reported pain score)  Effect on ADL:   Timing: Intermittent Modifying  factors: stillness, medications BP: 128/90  HR: 83  Amanda [name] comes in today for a follow-up visit after her initial evaluation on 03/18/2022.    UDS up-to-date and appropriate.  Patient is currently on Lyrica states that she has not been on it long enough to notice a difference.  She is not sure if she took her Celebrex. Her urine toxicology screen is appropriate, we will take over her hydrocodone 5 mg twice daily today. We also discussed injections such as a lumbar epidural steroid injection for her low back and radiating leg pain as well as a glenohumeral joint injection for bilateral shoulder pain related to shoulder osteoarthritis. She states that she would like to see how she does with Lyrica and hydrocodone and if these are not helping her, she will consider interventional therapies.   HPI from initial clinic visit 03/18/22: Amanda [name] is a pleasant 81 year old female who presents with a chief complaint of low back pain as well as bilateral shoulder pain, right greater than left.  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  She states that her mid back pain does not really radiate.  She describes it as throbbing in nature.  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  She has tried oxycodone 5 mg twice a day in the past as well as hydrocodone 5 mg twice daily as needed in the past which she states were beneficial.  She has tried gabapentin 600 mg twice a day in the past with limited response.  She has limited ability walking.  She has significant kyphosis.  She has never had any spinal injections done.  She has a history of bilateral hip replacements.  Controlled Substance Pharmacotherapy Assessment REMS (Risk  Evaluation and Mitigation Strategy)  Opioid Analgesic:  Hydrocodone 5 mg BID prn, first Rx today  Pill Count: None expected due to no  prior prescriptions written by our practice. No notes on file  Pharmacodynamics: Desired effects: Analgesia: [name] reports >50% benefit. Functional ability: Patient reports that medication allows her to accomplish basic ADLs Clinically meaningful improvement in function (CMIF): Sustained CMIF goals met Perceived effectiveness: Described as relatively effective, allowing for increase in activities of daily living (ADL) Undesirable effects: Side-effects or Adverse reactions: None reported Monitoring: [place] PMP: PDMP reviewed during this encounter. Online review of the past 63-monthperiod previously conducted. Not applicable at this point since we have not taken over the patient's medication management yet. List of other Serum/Urine Drug Screening Test(s):  No results found for: AMPHSCRSER, BARBSCRSER, BENZOSCRSER, COCAINSCRSER, COCAINSCRNUR, PCPSCRSER, THCSCRSER, THCU, CANNABQUANT, [place] of [place] UDS test(s) done:  Lab Results  Component Value Date   SUMMARY Note 03/18/2022   Last UDS on record: Summary  Date Value Ref Range Status  03/18/2022 Note  Final    Comment:    ==================================================================== Compliance Drug Analysis, Ur ==================================================================== Test                             Result       Flag       Units  Drug Present not Declared for Prescription Verification   Temazepam                      50           UNEXPECTED ng/mg creat    Temazepam may be administered as a scheduled prescription medication;    it is also an expected metabolite of diazepam.    Hydrocodone                    1034         UNEXPECTED ng/mg creat   Norhydrocodone                 1308         UNEXPECTED ng/mg creat    Sources of hydrocodone include scheduled prescription medications.    Norhydrocodone is an expected metabolite of hydrocodone.    Gabapentin                      PRESENT      UNEXPECTED   Cyclobenzaprine                PRESENT      UNEXPECTED   Desmethylcyclobenzaprine       PRESENT      UNEXPECTED    Desmethylcyclobenzaprine is an expected metabolite of    cyclobenzaprine.    Citalopram                     PRESENT      UNEXPECTED   Desmethylcitalopram            PRESENT      UNEXPECTED    Desmethylcitalopram is an expected metabolite of citalopram or the    enantiomeric form, escitalopram.    Duloxetine                     PRESENT      UNEXPECTED   Acetaminophen  PRESENT      UNEXPECTED  Drug Absent but Declared for Prescription Verification   Pregabalin                     Not Detected UNEXPECTED ==================================================================== Test                      Result    Flag   Units      Ref Range   Creatinine              80               mg/dL      >=20 ==================================================================== Declared Medications:  The flagging and interpretation on this report are based on the  following declared medications.  Unexpected results may arise from  inaccuracies in the declared medications.   **Note: The testing scope of this panel includes these medications:   Pregabalin (Lyrica)   **Note: The testing scope of this panel does not include the  following reported medications:   Atropine (Lomotil)  Carvedilol (Coreg)  Celecoxib (Celebrex)  Cetirizine (Zyrtec)  Cholecalciferol  Colestipol (Colestid)  Cyanocobalamin  Diphenoxylate (Lomotil)  Hydrochlorothiazide (Maxzide)  Meclizine (Antivert)  Oxybutynin (Ditropan)  Pancrelipase (Creon)  Triamterene (Maxzide) ==================================================================== For clinical consultation, please call [phone]. ====================================================================    UDS interpretation: No unexpected findings.          Medication Assessment Form: Patient introduced to  form today Treatment compliance: Treatment may start today if patient agrees with proposed plan. Evaluation of compliance is not applicable at this point Risk Assessment Profile: Aberrant behavior: See initial evaluations. None observed or detected today Comorbid factors increasing risk of overdose: See initial evaluation. No additional risks detected today Opioid risk tool (ORT):     03/18/2022   10:10 AM  Opioid Risk   Alcohol 0  Illegal Drugs 0  Rx Drugs 0  Alcohol 0  Rx Drugs 0  Age between 16-45 years  0  Psychological Disease 0  Depression 0  Opioid Risk Tool Scoring 0  Opioid Risk Interpretation Low Risk    ORT Scoring interpretation table:  Score <3 = Low Risk for SUD  Score between 4-7 = Moderate Risk for SUD  Score >8 = High Risk for Opioid Abuse   Risk of substance use disorder (SUD): Low  Risk Mitigation Strategies:  Patient opioid safety counseling: Completed today. Counseling provided to patient as per "Patient Counseling Document". Document signed by patient, attesting to counseling and understanding Patient-Prescriber Agreement (PPA): Obtained today.  Controlled substance notification to other providers: Written and sent today.  Pharmacologic Plan: Today we may be taking over the patient's pharmacological regimen. See below.             Laboratory Chemistry Profile   Renal Lab Results  Component Value Date   BUN 44 (H) 01/09/2022   CREATININE 1.17 (H) 01/09/2022   GFRAA 47 (L) 07/03/2019   GFRNONAA 47 (L) 01/09/2022   PROTEINUR NEGATIVE 01/09/2022     Electrolytes Lab Results  Component Value Date   NA 137 01/09/2022   K 4.6 01/09/2022   CL 106 01/09/2022   CALCIUM 9.9 01/09/2022   MG 2.0 02/05/2015     Hepatic Lab Results  Component Value Date   AST 29 07/03/2019   ALT 13 07/03/2019   ALBUMIN 4.3 07/03/2019   ALKPHOS 74 07/03/2019   LIPASE 160 12/13/2013     ID Lab Results  Component Value  Date   STAPHAUREUS NEGATIVE 02/18/2018    MRSAPCR NEGATIVE 02/18/2018     Bone No results found for: VD25OH, IR678LF8BOF, BP1025EN2, DP8242PN3, 25OHVITD1, 25OHVITD2, 25OHVITD3, TESTOFREE, TESTOSTERONE   Endocrine Lab Results  Component Value Date   GLUCOSE 107 (H) 01/09/2022   GLUCOSEU NEGATIVE 01/09/2022   HGBA1C SEE COMMENT 02/15/2012     Neuropathy Lab Results  Component Value Date   HGBA1C SEE COMMENT 02/15/2012     CNS No results found for: COLORCSF, APPEARCSF, RBCCOUNTCSF, WBCCSF, POLYSCSF, LYMPHSCSF, EOSCSF, PROTEINCSF, GLUCCSF, JCVIRUS, CSFOLI, IGGCSF, LABACHR, ACETBL, LABACHR, ACETBL   Inflammation (CRP: Acute  ESR: Chronic) Lab Results  Component Value Date   ESRSEDRATE 54 (H) 02/18/2018     Rheumatology No results found for: RF, ANA, LABURIC, URICUR, LYMEIGGIGMAB, LYMEABIGMQN, HLAB27   Coagulation Lab Results  Component Value Date   INR 0.95 02/18/2018   LABPROT 12.6 02/18/2018   APTT 41 (H) 02/18/2018   PLT 197 01/09/2022   LABHEMA Note: 02/21/2015     Cardiovascular Lab Results  Component Value Date   BNP 934 (H) 02/11/2012   CKTOTAL 362 (H) 07/03/2019   CKMB 5.8 (H) 04/12/2014   TROPONINI <0.03 02/05/2015   HGB 13.6 01/09/2022   HCT 42.3 01/09/2022     Screening Lab Results  Component Value Date   STAPHAUREUS NEGATIVE 02/18/2018   MRSAPCR NEGATIVE 02/18/2018     Cancer No results found for: CEA, CA125, LABCA2   Allergens No results found for: ALMOND, APPLE, ASPARAGUS, AVOCADO, BANANA, BARLEY, BASIL, BAYLEAF, GREENBEAN, LIMABEAN, WHITEBEAN, BEEFIGE, REDBEET, BLUEBERRY, BROCCOLI, CABBAGE, MELON, CARROT, CASEIN, CASHEWNUT, CAULIFLOWER, CELERY     Note: Lab results reviewed.  Recent Diagnostic Imaging Review    Narrative CLINICAL DATA:  Head trauma, minor (Age >= 65y); Neck trauma (Age >= 65y)  EXAM: CT HEAD WITHOUT CONTRAST  CT CERVICAL SPINE WITHOUT CONTRAST  TECHNIQUE: Multidetector CT imaging of the head and cervical spine was performed following the standard  protocol without intravenous contrast. Multiplanar CT image reconstructions of the cervical spine were also generated.  RADIATION DOSE REDUCTION: This exam was performed according to the departmental dose-optimization program which includes automated exposure control, adjustment of the mA and/or kV according to patient size and/or use of iterative reconstruction technique.  COMPARISON:  Mar 18, 2013  FINDINGS: CT HEAD FINDINGS  Brain: No evidence of acute infarction, hemorrhage, hydrocephalus, extra-axial collection or mass lesion/mass effect. Periventricular white matter hypodensities consistent with sequela of chronic microvascular ischemic disease. Global parenchymal volume loss. Remote LEFT insular lacunar infarction.  Vascular: Vascular calcifications of the carotid siphons.  Skull: Normal. Negative for fracture or focal lesion.  Sinuses/Orbits: No acute finding.  Other: None.  CT CERVICAL SPINE FINDINGS  Alignment: Normal.  Skull base and vertebrae: Osteopenia.  No acute fracture.  Soft tissues and spinal canal: No prevertebral fluid or swelling. No visible canal hematoma.  Disc levels: Multilevel facet arthropathy and uncovertebral hypertrophy results in multilevel osseous neuroforaminal narrowing, most pronounced at LEFT C4-5 and bilateral C5-6.  Upper chest: Mild debris in the trachea.  Other: Atherosclerotic calcifications.  IMPRESSION: 1.  No acute intracranial abnormality. 2.  No acute fracture or static subluxation of the cervical spine.   Electronically Signed By: Valentino Saxon M.D. On: 01/09/2022 15:53   Narrative CLINICAL DATA:  Frequent falls, RIGHT shoulder pain  EXAM: RIGHT SHOULDER - 2+ VIEW  COMPARISON:  None  FINDINGS: Osseous demineralization.  Glenohumeral joint space narrowing and spur formation.  AC joint alignment normal.  No acute fracture,  dislocation, or bone destruction.  Deformities from old fractures of  lateral RIGHT fourth and fifth ribs.  IMPRESSION: RIGHT glenohumeral degenerative changes and osseous demineralization.  No acute abnormalities.   Electronically Signed By: Lavonia Dana M.D. On: 01/09/2022 12:36   Lumbosacral Imaging: Lumbar MR wo contrast: Results for orders placed during the hospital encounter of 03/09/19  MR LUMBAR SPINE WO CONTRAST  Narrative CLINICAL DATA:  Chronic low back pain. New pain extending into the left lower extremity.  EXAM: MRI LUMBAR SPINE WITHOUT CONTRAST  TECHNIQUE: Multiplanar, multisequence MR imaging of the lumbar spine was performed. No intravenous contrast was administered.  COMPARISON:  MRI of lumbar spine 10/29/2010  FINDINGS: Segmentation: 5 non rib-bearing lumbar type vertebral bodies are present. The lowest fully formed vertebral body is L5.  Alignment: Grade 1 anterolisthesis at L4-5 is progressed slightly. There is new anterolisthesis at L2-3 and L3-4, degenerative. Levoconvex curvature of the lumbar spine is centered at L3.  Vertebrae: Chronic endplate Schmorl's nodes are again noted without significant change. Vertebral body heights are otherwise normal. Endplate sclerotic changes are noted on the left at L4-5.  Conus medullaris and cauda equina: Conus extends to the T12-L1 level. Conus and cauda equina appear normal.  Paraspinal and other soft tissues: Limited imaging the abdomen is unremarkable. There is no significant adenopathy. No solid organ lesions are present.  Disc levels:  L1-2: A broad-based disc protrusion has progressed. There is no significant stenosis.  L2-3: A broad-based disc protrusion is present. Moderate facet hypertrophy is progressed. There is moderate central canal narrowing with subarticular narrowing bilaterally. Moderate foraminal narrowing is worse on the left.  L3-4: A broad-based disc protrusion is present. Patient is status post laminectomy. Posterior canal is decompressed.  Left subarticular narrowing is present. Moderate foraminal narrowing is worse on the right. Anterolisthesis is progressed.  L4-5: A broad-based disc protrusion is present. Right laminectomy is noted. Central canal is decompressed. Disc material extends into the foramina bilaterally. Mild right and severe left foraminal stenosis is present.  L5-S1: A broad-based disc protrusion is asymmetric to the right. Mild right subarticular narrowing has progressed. Foramina are patent.  IMPRESSION: 1. Progressive grade 1 anterolisthesis with lateral disc protrusions at L4-5 results in progressive severe left foraminal stenosis, likely impacting the left L4 nerve root. This is the worst left-sided disease. 2. Central canal is decompressed at L4-5. 3. Left subarticular narrowing at L3-4 despite laminectomy. 4. Progressive moderate bilateral foraminal narrowing at L3-4, right greater than left. 5. Moderate central and bilateral foraminal narrowing at L2-3 is worse on the left. 6. Mild right subarticular narrowing at L5-S1 has progressed.   Electronically Signed By: San Morelle M.D. On: 03/09/2019 08:50    Narrative CLINICAL DATA:  Right hip pain when walking. History of prior left total hip arthroplasty.  EXAM: MR OF THE RIGHT HIP WITHOUT CONTRAST  TECHNIQUE: Multiplanar, multisequence MR imaging was performed. No intravenous contrast was administered.  COMPARISON:  CT scan 02/09/2015  FINDINGS: Significant artifact associated with the left hip prosthesis. No obvious complicating features are identified.  The right hip is normally located. Moderate to significant degenerative changes with symmetric joint space narrowing. No stress fracture or AVN. No hip joint effusion or periarticular fluid collections to suggest a paralabral cyst. There is mild peritendinosis and mild trochanteric bursitis.  The pubic symphysis and SI joints are intact. No pelvic fractures or bone  lesions.  The surrounding hip and pelvic muscles are intact. No muscle tear or myositis. There is moderate  right hamstring tendinopathy and bursitis.  No significant intrapelvic abnormalities. There is severe sigmoid diverticulosis but no findings for acute diverticulitis. No inguinal mass or hernia.  IMPRESSION: 1. Moderate to significant degenerative changes involving the right hip with marked joint space narrowing. No stress fracture or AVN. 2. Peritendinosis and mild trochanteric bursitis. 3. Right hamstring tendinopathy and bursitis. 4. No obvious complicating features associated with the left hip prosthesis.   Electronically Signed By: Marijo Sanes M.D. On: 11/21/2017 22:52  Hip-L MR wo contrast: Results for orders placed during the hospital encounter of 04/15/17  MR HIP LEFT WO CONTRAST  Narrative CLINICAL DATA:  Left hip pain.  Ongoing for 2 years.  EXAM: MR OF THE LEFT HIP WITHOUT CONTRAST  TECHNIQUE: Multiplanar, multisequence MR imaging was performed. No intravenous contrast was administered.  COMPARISON:  None.  FINDINGS: Bones: No hip fracture, dislocation or avascular necrosis. No periosteal reaction or bone destruction. No aggressive osseous lesion.  Mild osteoarthritis of the left sacroiliac joint. No SI joint widening or erosive changes.  Degenerative disc disease with disc height loss at L3-4, L4-5 and L5-S1.  Articular cartilage and labrum  Articular cartilage: High-grade partial-thickness cartilage loss of the left femoral head and acetabulum with subchondral reactive marrow edema in the acetabulum and femoral head. Mild partial-thickness cartilage loss of the right femoral head and acetabulum.  Labrum: Grossly intact, but evaluation is limited by lack of intraarticular fluid.  Joint or bursal effusion  Joint effusion: Small left hip joint effusion. No right hip joint effusion.  Bursae: Small amount of fluid in the right greater  trochanteric bursa. Small amount of fluid in the left greater trochanteric bursa.  Muscles and tendons  Flexors: Normal.  Extensors: Normal.  Abductors: Normal.  Adductors: Normal.  Gluteals: Normal.  Hamstrings: Small partial tear of the right hamstring origin. Mild tendinosis of the left hamstring origin.  Other findings  Miscellaneous: No pelvic free fluid. No fluid collection or hematoma. No inguinal lymphadenopathy. No inguinal hernia. Sigmoid diverticulosis without evidence of diverticulitis.  IMPRESSION: 1. Moderate osteoarthritis of the left hip. 2. Mild osteoarthritis of the right hip. 3. Mild bilateral greater trochanteric bursitis. 4. Mild tendinosis of the left hamstring origin.   Electronically Signed By: Kathreen Devoid On: 04/15/2017 12:39   Narrative CLINICAL DATA:  RIGHT hip pain, frequent falls  EXAM: DG HIP (WITH OR WITHOUT PELVIS) 2-3V RIGHT  COMPARISON:  None  FINDINGS: Osseous demineralization.  BILATERAL hip prostheses.  SI joints preserved.  No acute fracture, dislocation, or bone destruction.  Degenerative disc disease changes lower lumbar spine.  IMPRESSION: Osseous demineralization with BILATERAL hip prostheses and lumbar degenerative disc disease changes.  No acute abnormalities.   Electronically Signed By: Lavonia Dana M.D. On: 01/09/2022 12:39  Hip-L DG 2-3 views: Results for orders placed during the hospital encounter of 07/15/18  DG Hip Unilat W or Wo Pelvis 2-3 Views Left  Narrative CLINICAL DATA:  81 year old female with a history of prior bilateral hip replacements. She tripped over her cane and fell this evening.  EXAM: DG HIP (WITH OR WITHOUT PELVIS) 2-3V LEFT  COMPARISON:  Concurrently obtained radiographs of the right hip  FINDINGS: The bony pelvis appears intact. Surgical changes of bilateral hip arthroplasty. No evidence of periprosthetic fracture or other hardware complication. The visualized bowel  gas pattern is normal.  IMPRESSION: Bilateral hip arthroplasties without evidence of hardware complication.   Electronically Signed By: Jacqulynn Cadet M.D. On: 07/15/2018 20:34  Narrative CLINICAL DATA:  Right  foot trauma, right foot pain  EXAM: RIGHT FOOT COMPLETE - 3+ VIEW  COMPARISON:  None.  FINDINGS: Three view radiograph right foot demonstrates a healed remote second metatarsal neck fracture with minimal residual deformity. Normal overall alignment. No acute fracture or dislocation. Small plantar calcaneal spur. Vascular calcifications are seen within the soft tissues.  IMPRESSION: 1. No acute fracture or dislocation. 2. Healed remote second metatarsal neck fracture.   Electronically Signed By: Fidela Salisbury MD On: 07/01/2020 18:45  Complexity Note: Imaging results reviewed. Results shared with Ms. Duross, using Layman's terms.                         Meds   Current Outpatient Medications:    Cholecalciferol 25 MCG (1000 UT) CHEW, Chew 1 tablet by mouth daily., Disp: , Rfl:    Cyanocobalamin (B-12 PO), Take 1 capsule by mouth daily., Disp: , Rfl:    diclofenac Sodium (VOLTAREN) 1 % GEL, Apply topically 4 (four) times daily as needed., Disp: , Rfl:    DULoxetine (CYMBALTA) 20 MG capsule, Take 20 mg by mouth 2 (two) times daily., Disp: , Rfl:    HYDROcodone-acetaminophen (NORCO/VICODIN) 5-325 MG tablet, Take 1 tablet by mouth every 12 (twelve) hours. Must last 30 days., Disp: 60 tablet, Rfl: 0   [START ON 05/09/2022] HYDROcodone-acetaminophen (NORCO/VICODIN) 5-325 MG tablet, Take 1 tablet by mouth every 12 (twelve) hours. Must last 30 days., Disp: 60 tablet, Rfl: 0   pregabalin (LYRICA) 25 MG capsule, Take 1 capsule (25 mg total) by mouth at bedtime for 15 days, THEN 1 capsule (25 mg total) 2 (two) times daily., Disp: 75 capsule, Rfl: 0   triamterene-hydrochlorothiazide (MAXZIDE-25) 37.5-25 MG tablet, Take 1 tablet by mouth daily., Disp: , Rfl:    valsartan  (DIOVAN) 160 MG tablet, Take 160 mg by mouth daily., Disp: , Rfl:    carvedilol (COREG) 6.25 MG tablet, Take 6.25 mg by mouth daily.  (Patient not taking: Reported on 04/09/2022), Disp: , Rfl:    cetirizine (ZYRTEC) 10 MG tablet, Take 10 mg by mouth daily. (Patient not taking: Reported on 04/09/2022), Disp: , Rfl:    colestipol (COLESTID) 1 g tablet, Take 2 tablets (2 g total) by mouth daily., Disp: 180 tablet, Rfl: 1   diphenoxylate-atropine (LOMOTIL) 2.5-0.025 MG tablet, Take 2 tablets by mouth 4 (four) times daily as needed for diarrhea or loose stools. (Patient not taking: Reported on 04/09/2022), Disp: , Rfl:    lipase/protease/amylase (CREON) 36000 UNITS CPEP capsule, Take 36,000 Units by mouth 3 (three) times daily with meals. (Patient not taking: Reported on 04/09/2022), Disp: , Rfl:    meclizine (ANTIVERT) 12.5 MG tablet, Take 1 tablet by mouth daily. (Patient not taking: Reported on 04/09/2022), Disp: , Rfl:    oxybutynin (DITROPAN) 5 MG tablet, Take 5 mg by mouth 2 (two) times daily. (Patient not taking: Reported on 04/09/2022), Disp: , Rfl:   ROS  Constitutional: Denies any fever or chills Gastrointestinal: No reported hemesis, hematochezia, vomiting, or acute GI distress Musculoskeletal:  Low back radiating bilateral leg pain, bilateral shoulder pain Neurological: No reported episodes of acute onset apraxia, aphasia, dysarthria, agnosia, amnesia, paralysis, loss of coordination, or loss of consciousness  Allergies  Ms. Koelzer is allergic to other, codeine, and penicillins.  [place]  Drug: Ms. Baehr  reports no history of drug use. Alcohol:  reports no history of alcohol use. Tobacco:  reports that she has quit smoking. Her smoking use included cigarettes. She  has a 30.00 pack-year smoking history. She has never used smokeless tobacco. Medical:  has a past medical history of Anxiety, Asthma, Complication of anesthesia, GI bleed, Hiatal hernia, Hypertension, and IDA (iron deficiency  anemia). Surgical: Ms. Matney  has a past surgical history that includes Colonoscopy (04/28/2012); Breast biopsy (Left, 01/17/2002); Upper gi endoscopy (04/28/2012); Colonoscopy (02-12-15); Esophageal manometry (N/A, 06/13/2015); Esophagus surgery (Oct 2016); Paraesophageal hernia repair (07-26-15); Parathyroidectomy (Left); Abdominal hysterectomy; Knee arthroscopy (Left); collapsed  lung (Left); Bilateral carpal tunnel release; Total hip arthroplasty (Left, 05/12/2017); Back surgery; and Total hip arthroplasty (Right, 03/04/2018). Family: family history is not on file.  Constitutional Exam  General appearance: Well nourished, well developed, and well hydrated. In no apparent acute distress Vitals:   04/09/22 1339  BP: 128/90  Pulse: 83  Resp: 18  Temp: (!) 97.3 F (36.3 C)  TempSrc: Temporal  SpO2: 97%  Weight: 175 lb (79.4 kg)  Height: '5\' 2"'  (1.575 m)   BMI Assessment: Estimated body mass index is 32.01 kg/m as calculated from the following:   Height as of this encounter: '5\' 2"'  (1.575 m).   Weight as of this encounter: 175 lb (79.4 kg).  BMI interpretation table: BMI level Category Range association with higher incidence of chronic pain  <18 kg/m2 Underweight   18.5-24.9 kg/m2 Ideal body weight   25-29.9 kg/m2 Overweight Increased incidence by 20%  30-34.9 kg/m2 Obese (Class I) Increased incidence by 68%  35-39.9 kg/m2 Severe obesity (Class II) Increased incidence by 136%  >40 kg/m2 Extreme obesity (Class III) Increased incidence by 254%   Patient's current BMI Ideal Body weight  Body mass index is 32.01 kg/m. Ideal body weight: 50.1 kg (110 lb 7.2 oz) Adjusted ideal body weight: 61.8 kg (136 lb 4.3 oz)   BMI Readings from Last 4 Encounters:  04/09/22 32.01 kg/m  03/18/22 33.84 kg/m  09/13/20 32.61 kg/m  07/01/20 29.18 kg/m   Wt Readings from Last 4 Encounters:  04/09/22 175 lb (79.4 kg)  03/18/22 185 lb (83.9 kg)  09/13/20 190 lb (86.2 kg)  07/01/20 170 lb (77.1 kg)     Psych/Mental status: Alert, oriented x 3 (person, place, & time)       Eyes: PERLA Respiratory: No evidence of acute respiratory distress  Cervical Spine Area Exam  Skin & Axial Inspection: No masses, redness, edema, swelling, or associated skin lesions Alignment: Symmetrical Functional ROM: Pain restricted ROM      Stability: No instability detected Muscle Tone/Strength: Functionally intact. No obvious neuro-muscular anomalies detected. Sensory (Neurological): Musculoskeletal pain pattern Palpation: No palpable anomalies             Upper Extremity (UE) Exam      Side: Right upper extremity   Side: Left upper extremity  Skin & Extremity Inspection: Skin color, temperature, and hair growth are WNL. No peripheral edema or cyanosis. No masses, redness, swelling, asymmetry, or associated skin lesions. No contractures.   Skin & Extremity Inspection: Skin color, temperature, and hair growth are WNL. No peripheral edema or cyanosis. No masses, redness, swelling, asymmetry, or associated skin lesions. No contractures.  Functional ROM: Pain restricted ROM for shoulder   Functional ROM: Pain restricted ROM for shoulder  Muscle Tone/Strength: Functionally intact. No obvious neuro-muscular anomalies detected.   Muscle Tone/Strength: Functionally intact. No obvious neuro-muscular anomalies detected.  Sensory (Neurological): Arthropathic arthralgia           Sensory (Neurological): Arthropathic arthralgia          Palpation: No palpable anomalies  Palpation: No palpable anomalies              Provocative Test(s):  Phalen's test: deferred Tinel's test: deferred Apley's scratch test (touch opposite shoulder):  Action 1 (Across chest): Decreased ROM Action 2 (Overhead): Decreased ROM Action 3 (LB reach): Decreased ROM     Provocative Test(s):  Phalen's test: deferred Tinel's test: deferred Apley's scratch test (touch opposite shoulder):  Action 1 (Across chest): Decreased ROM Action  2 (Overhead): Decreased ROM Action 3 (LB reach): Decreased ROM      Thoracic Spine Area Exam  Skin & Axial Inspection: Significant thoracic kyphosis Alignment: Symmetrical Functional ROM: Unrestricted ROM Stability: No instability detected Muscle Tone/Strength: Functionally intact. No obvious neuro-muscular anomalies detected. Sensory (Neurological): Dermatomal pain pattern Muscle strength & Tone: No palpable anomalies   Lumbar Spine Area Exam  Skin & Axial Inspection: Well healed scar from previous spine surgery detected Alignment: Levoscoliosis Functional ROM: Pain restricted ROM       Stability: No instability detected Muscle Tone/Strength: Functionally intact. No obvious neuro-muscular anomalies detected. Sensory (Neurological): Dermatomal pain pattern     Gait & Posture Assessment  Ambulation: Limited Gait: Antalgic gait (limping) Posture: Difficulty standing up straight, due to pain  Lower Extremity Exam      Side: Right lower extremity   Side: Left lower extremity  Stability: No instability observed           Stability: No instability observed          Skin & Extremity Inspection: Skin color, temperature, and hair growth are WNL. No peripheral edema or cyanosis. No masses, redness, swelling, asymmetry, or associated skin lesions. No contractures.   Skin & Extremity Inspection: Skin color, temperature, and hair growth are WNL. No peripheral edema or cyanosis. No masses, redness, swelling, asymmetry, or associated skin lesions. No contractures.  Functional ROM: Pain restricted ROM for hip and knee joints           Functional ROM: Pain restricted ROM for hip and knee joints                  Muscle Tone/Strength: Functionally intact. No obvious neuro-muscular anomalies detected.   Muscle Tone/Strength: Functionally intact. No obvious neuro-muscular anomalies detected.  Sensory (Neurological): Arthropathic arthralgia         Sensory (Neurological): Arthropathic arthralgia         DTR: Patellar: deferred today Achilles: deferred today Plantar: deferred today   DTR: Patellar: deferred today Achilles: deferred today Plantar: deferred today  Palpation: No palpable anomalies   Palpation: No palpable anomalies       Assessment & Plan  Primary Diagnosis & Pertinent Problem List: The primary encounter diagnosis was Failed back surgical syndrome. Diagnoses of Spinal stenosis of lumbar region with neurogenic claudication, Chronic pain of both shoulders, Chronic radicular lumbar pain, Pain management contract signed, and Chronic pain syndrome were also pertinent to this visit.  Visit Diagnosis: 1. Failed back surgical syndrome   2. Spinal stenosis of lumbar region with neurogenic claudication   3. Chronic pain of both shoulders   4. Chronic radicular lumbar pain   5. Pain management contract signed   6. Chronic pain syndrome    Problems updated and reviewed during this visit: Problem  Pain Management Contract Signed    Plan of Care   Spinal stenosis of lumbar region with neurogenic claudication Reviewed MRI with patient in great detail from 2020.  Multilevel spinal canal stenosis, moderate to severe.  Discussed home PT exercises, addition  of Lyrica as below and consideration of lumbar epidural steroid injection   Lumbar facet arthropathy Consider diagnostic lumbar facet medial branch nerve blocks   History of bilateral hip replacements Stable.   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   Chronic radicular lumbar pain Consider caudal ESI   Chronic pain of both shoulders Consider glenohumeral joint injection, suprascapular nerve block   Pharmacotherapy (Medications Ordered): Meds ordered this encounter  Medications   HYDROcodone-acetaminophen (NORCO/VICODIN) 5-325 MG tablet    Sig: Take 1 tablet by mouth every 12 (twelve) hours. Must last 30 days.    Dispense:  60 tablet     Refill:  0    Chronic Pain: STOP Act (Not applicable) Fill 1 day early if closed on refill date. Avoid benzodiazepines within 8 hours of opioids   HYDROcodone-acetaminophen (NORCO/VICODIN) 5-325 MG tablet    Sig: Take 1 tablet by mouth every 12 (twelve) hours. Must last 30 days.    Dispense:  60 tablet    Refill:  0    Chronic Pain: STOP Act (Not applicable) Fill 1 day early if closed on refill date. Avoid benzodiazepines within 8 hours of opioids   Pharmacological management options:  Opioid Analgesics:  Hydrocodone 5 mg twice daily as needed  Membrane stabilizer:  Has tried and failed gabapentin.  On Lyrica  Muscle relaxant: To be determined at a later time  NSAID:  Trial of Celebrex   Other analgesic(s): To be determined at a later time    Interventional management options: Ms. Tauzin was informed that there is no guarantee that she would be a candidate for interventional therapies. The decision will be based on the results of diagnostic studies, as well as Ms. Atiyeh's risk profile.  Procedure(s) under consideration:  Lumbar epidural steroid injection Lumbar facet medial branch nerve blocks Glenohumeral joint injection Suprascapular nerve block    Provider-requested follow-up: Return in about 8 weeks (around 06/04/2022) for Medication Management, in person.  I spent a total of 30 minutes reviewing chart data, face-to-face evaluation with the patient, counseling and coordination of care as detailed above.'  Recent Visits Date Type Provider Dept  03/18/22 Office Visit Gillis Santa, MD [facility]  Showing recent visits within past 90 days and meeting all other requirements Today's Visits Date Type Provider Dept  04/09/22 Office Visit Gillis Santa, MD [facility]  Showing today's visits and meeting all other requirements Future Appointments Date Type Provider Dept  05/29/22 Appointment Gillis Santa, MD [facility]  Showing future appointments  within next 90 days and meeting all other requirements  Primary Care Physician: Gladstone Lighter, MD Note by: Gillis Santa, MD Date: 04/09/2022; Time: 2:44 PM

## 2022-04-09 NOTE — Patient Instructions (Signed)
Hydrocodone at 6 am and 6 pm (twice a day)

## 2022-04-15 DIAGNOSIS — N1832 Chronic kidney disease, stage 3b: Secondary | ICD-10-CM | POA: Diagnosis not present

## 2022-04-15 DIAGNOSIS — E559 Vitamin D deficiency, unspecified: Secondary | ICD-10-CM | POA: Diagnosis not present

## 2022-04-15 DIAGNOSIS — M810 Age-related osteoporosis without current pathological fracture: Secondary | ICD-10-CM | POA: Diagnosis not present

## 2022-05-20 ENCOUNTER — Emergency Department: Payer: Medicare Other

## 2022-05-20 ENCOUNTER — Observation Stay
Admission: EM | Admit: 2022-05-20 | Discharge: 2022-05-22 | Disposition: A | Discharge status: Home Health | Payer: Medicare Other | Attending: Internal Medicine | Admitting: Internal Medicine

## 2022-05-20 ENCOUNTER — Other Ambulatory Visit: Payer: Self-pay

## 2022-05-20 DIAGNOSIS — E892 Postprocedural hypoparathyroidism: Secondary | ICD-10-CM | POA: Diagnosis not present

## 2022-05-20 DIAGNOSIS — E559 Vitamin D deficiency, unspecified: Secondary | ICD-10-CM | POA: Diagnosis not present

## 2022-05-20 DIAGNOSIS — E860 Dehydration: Secondary | ICD-10-CM | POA: Diagnosis not present

## 2022-05-20 DIAGNOSIS — N200 Calculus of kidney: Secondary | ICD-10-CM | POA: Insufficient documentation

## 2022-05-20 DIAGNOSIS — I129 Hypertensive chronic kidney disease with stage 1 through stage 4 chronic kidney disease, or unspecified chronic kidney disease: Secondary | ICD-10-CM | POA: Insufficient documentation

## 2022-05-20 DIAGNOSIS — Z96643 Presence of artificial hip joint, bilateral: Secondary | ICD-10-CM | POA: Insufficient documentation

## 2022-05-20 DIAGNOSIS — R4182 Altered mental status, unspecified: Secondary | ICD-10-CM | POA: Diagnosis not present

## 2022-05-20 DIAGNOSIS — E875 Hyperkalemia: Principal | ICD-10-CM | POA: Insufficient documentation

## 2022-05-20 DIAGNOSIS — G894 Chronic pain syndrome: Secondary | ICD-10-CM | POA: Diagnosis not present

## 2022-05-20 DIAGNOSIS — I700 Atherosclerosis of aorta: Secondary | ICD-10-CM | POA: Insufficient documentation

## 2022-05-20 DIAGNOSIS — R410 Disorientation, unspecified: Secondary | ICD-10-CM | POA: Diagnosis not present

## 2022-05-20 DIAGNOSIS — K5730 Diverticulosis of large intestine without perforation or abscess without bleeding: Secondary | ICD-10-CM | POA: Diagnosis not present

## 2022-05-20 DIAGNOSIS — Z87891 Personal history of nicotine dependence: Secondary | ICD-10-CM | POA: Insufficient documentation

## 2022-05-20 DIAGNOSIS — N1831 Chronic kidney disease, stage 3a: Secondary | ICD-10-CM | POA: Diagnosis not present

## 2022-05-20 DIAGNOSIS — Z79899 Other long term (current) drug therapy: Secondary | ICD-10-CM | POA: Diagnosis not present

## 2022-05-20 DIAGNOSIS — R778 Other specified abnormalities of plasma proteins: Secondary | ICD-10-CM | POA: Insufficient documentation

## 2022-05-20 DIAGNOSIS — N179 Acute kidney failure, unspecified: Secondary | ICD-10-CM | POA: Insufficient documentation

## 2022-05-20 DIAGNOSIS — N3289 Other specified disorders of bladder: Secondary | ICD-10-CM | POA: Diagnosis not present

## 2022-05-20 DIAGNOSIS — G9341 Metabolic encephalopathy: Secondary | ICD-10-CM | POA: Insufficient documentation

## 2022-05-20 DIAGNOSIS — J45909 Unspecified asthma, uncomplicated: Secondary | ICD-10-CM | POA: Diagnosis not present

## 2022-05-20 DIAGNOSIS — K8689 Other specified diseases of pancreas: Secondary | ICD-10-CM | POA: Diagnosis not present

## 2022-05-20 DIAGNOSIS — T50905A Adverse effect of unspecified drugs, medicaments and biological substances, initial encounter: Secondary | ICD-10-CM | POA: Diagnosis not present

## 2022-05-20 DIAGNOSIS — E782 Mixed hyperlipidemia: Secondary | ICD-10-CM | POA: Diagnosis not present

## 2022-05-20 DIAGNOSIS — G8929 Other chronic pain: Secondary | ICD-10-CM

## 2022-05-20 DIAGNOSIS — R7989 Other specified abnormal findings of blood chemistry: Secondary | ICD-10-CM | POA: Diagnosis present

## 2022-05-20 DIAGNOSIS — I10 Essential (primary) hypertension: Secondary | ICD-10-CM | POA: Diagnosis not present

## 2022-05-20 LAB — CBC WITH DIFFERENTIAL/PLATELET
Abs Immature Granulocytes: 0.02 10*3/uL (ref 0.00–0.07)
Basophils Absolute: 0.1 10*3/uL (ref 0.0–0.1)
Basophils Relative: 1 %
Eosinophils Absolute: 0.1 10*3/uL (ref 0.0–0.5)
Eosinophils Relative: 2 %
HCT: 37.8 % (ref 36.0–46.0)
Hemoglobin: 12.1 g/dL (ref 12.0–15.0)
Immature Granulocytes: 0 %
Lymphocytes Relative: 29 %
Lymphs Abs: 1.4 10*3/uL (ref 0.7–4.0)
MCH: 28.4 pg (ref 26.0–34.0)
MCHC: 32 g/dL (ref 30.0–36.0)
MCV: 88.7 fL (ref 80.0–100.0)
Monocytes Absolute: 0.3 10*3/uL (ref 0.1–1.0)
Monocytes Relative: 7 %
Neutro Abs: 2.8 10*3/uL (ref 1.7–7.7)
Neutrophils Relative %: 61 %
Platelets: 183 10*3/uL (ref 150–400)
RBC: 4.26 MIL/uL (ref 3.87–5.11)
RDW: 14.5 % (ref 11.5–15.5)
WBC: 4.7 10*3/uL (ref 4.0–10.5)
nRBC: 0 % (ref 0.0–0.2)

## 2022-05-20 LAB — URINE DRUG SCREEN, QUALITATIVE (ARMC ONLY)
Amphetamines, Ur Screen: NOT DETECTED
Barbiturates, Ur Screen: NOT DETECTED
Benzodiazepine, Ur Scrn: POSITIVE — AB
Cannabinoid 50 Ng, Ur ~~LOC~~: NOT DETECTED
Cocaine Metabolite,Ur ~~LOC~~: NOT DETECTED
MDMA (Ecstasy)Ur Screen: NOT DETECTED
Methadone Scn, Ur: NOT DETECTED
Opiate, Ur Screen: POSITIVE — AB
Phencyclidine (PCP) Ur S: NOT DETECTED
Tricyclic, Ur Screen: POSITIVE — AB

## 2022-05-20 LAB — COMPREHENSIVE METABOLIC PANEL
ALT: 12 U/L (ref 0–44)
AST: 18 U/L (ref 15–41)
Albumin: 4 g/dL (ref 3.5–5.0)
Alkaline Phosphatase: 63 U/L (ref 38–126)
Anion gap: 9 (ref 5–15)
BUN: 76 mg/dL — ABNORMAL HIGH (ref 8–23)
CO2: 18 mmol/L — ABNORMAL LOW (ref 22–32)
Calcium: 8.9 mg/dL (ref 8.9–10.3)
Chloride: 112 mmol/L — ABNORMAL HIGH (ref 98–111)
Creatinine, Ser: 2.54 mg/dL — ABNORMAL HIGH (ref 0.44–1.00)
GFR, Estimated: 18 mL/min — ABNORMAL LOW (ref >60)
Glucose, Bld: 105 mg/dL — ABNORMAL HIGH (ref 70–99)
Potassium: 6.3 mmol/L (ref 3.5–5.1)
Sodium: 139 mmol/L (ref 135–145)
Total Bilirubin: 0.8 mg/dL (ref 0.3–1.2)
Total Protein: 6.9 g/dL (ref 6.5–8.1)

## 2022-05-20 LAB — TROPONIN I (HIGH SENSITIVITY)
Troponin I (High Sensitivity): 18 ng/L — ABNORMAL HIGH (ref <18)
Troponin I (High Sensitivity): 18 ng/L — ABNORMAL HIGH (ref <18)

## 2022-05-20 LAB — LIPASE, BLOOD: Lipase: 47 U/L (ref 11–51)

## 2022-05-20 MED ORDER — LACTATED RINGERS IV BOLUS
2000.0000 mL | Freq: Once | INTRAVENOUS | Status: AC
  Administered 2022-05-20: 2000 mL via INTRAVENOUS

## 2022-05-20 MED ORDER — SODIUM ZIRCONIUM CYCLOSILICATE 10 G PO PACK
10.0000 g | PACK | ORAL | Status: AC
  Administered 2022-05-20: 10 g via ORAL
  Filled 2022-05-20: qty 1

## 2022-05-20 NOTE — ED Triage Notes (Signed)
Pt presents to ED from [facility] clinic due to abnormal labs. First nurse states labs were as follows:   K+-7, BUN-78 Creatine-2.4  Pt states she was having a panick attack due having issues with breathing per husband. Pt states "I just feel off" but cannot tell this RN exactly what she means. Pt is oriented to her person, pt oriented to place, pt is not able to tell this RN the current year, the current president or the current month. Family denies DX of any dementia.

## 2022-05-20 NOTE — ED Provider Notes (Signed)
[facility] Provider Note    Event Date/Time   First MD Initiated Contact with Patient 05/20/22 2145     (approximate)   History   Abnormal Labs   HPI  Amanda [name] is a 81 y.o. female with past medical history of anxiety, asthma, hiatal hernia, HTN, deficiency anemia and some chronic back pain on gabapentin and Norco who presents coming by husband after being referred to the ED from clinic where she had some routine labs drawn that showed hyperkalemia.  She was initially evaluated due to some mild confusion and concern for possible panic attack on 7/8.  Substantial amount history is from husband as patient states "I do not know" to many direct questions.  Husband states that she seemed to be very anxious and was briefly short of breath and tremulous on this past Saturday.  He states that this seemed to have gotten better but that she still seemed off.  He thinks he is she has been taking her medications as directed but is not sure.  Both husband and patient deny any recent falls or injuries, cough, fevers, vomiting, diarrhea, abdominal pain, change in chronic back pain or any concern for EtOH or illicit drug use.  They think she has been taking her medications as directed.  Both patient has been think she may be getting a little dehydrated.  He has no history of CHF or kidney disease.  No other acute concerns at this time.    Past Medical History:  Diagnosis Date   Anxiety    Asthma    Complication of anesthesia    difficulty waking up from anesthesia   GI bleed    Hiatal hernia    Hypertension    IDA (iron deficiency anemia)      Physical Exam  Triage Vital Signs: ED Triage Vitals  Enc Vitals Group     BP 05/20/22 1654 (!) 110/59     Pulse Rate 05/20/22 1654 78     Resp 05/20/22 1654 17     Temp 05/20/22 1654 (!) 97.4 F (36.3 C)     Temp Source 05/20/22 1654 Oral     SpO2 05/20/22 1654 94 %     Weight --      Height --      Head Circumference  --      Peak Flow --      Pain Score 05/20/22 1655 0     Pain Loc --      Pain Edu? --      Excl. in GC? --     Most recent vital signs: Vitals:   05/20/22 1654  BP: (!) 110/59  Pulse: 78  Resp: 17  Temp: (!) 97.4 F (36.3 C)  SpO2: 94%    General: Awake, no distress.  CV:  1+ bilateral radial pulses.  No significant murmur. Resp:  Normal effort.  We are bilaterally. Abd:  No distention.  Soft. Other:  Patient is not oriented to year.  Patient's husband states that typically she would be oriented to year.  No pronator drift or finger dysmetria.  Cranial nerves II through XII are grossly intact.  Patient has symmetric strength in all extremities.  Sensation is intact to light touch all extremities.  CVA tenderness.  She does appear very dry.   ED Results / Procedures / Treatments  Labs (all labs ordered are listed, but only abnormal results are displayed) Labs Reviewed  COMPREHENSIVE METABOLIC PANEL - Abnormal; Notable for the  following components:      Result Value   Potassium 6.3 (*)    Chloride 112 (*)    CO2 18 (*)    Glucose, Bld 105 (*)    BUN 76 (*)    Creatinine, Ser 2.54 (*)    GFR, Estimated 18 (*)    All other components within normal limits  TROPONIN I (HIGH SENSITIVITY) - Abnormal; Notable for the following components:   Troponin I (High Sensitivity) 18 (*)    All other components within normal limits  CBC WITH DIFFERENTIAL/PLATELET  LIPASE, BLOOD  AMMONIA  URINE DRUG SCREEN, QUALITATIVE ([facility] ONLY)  TROPONIN I (HIGH SENSITIVITY)     EKG  EKG shows sinus rhythm with a ventricular rate of 80, normal axis, unremarkable intervals with some artifact appears possibly related to a tremor in leads I and lead III without other clear evidence of acute ischemia.   RADIOLOGY  CT head on my interpretation not evidence of hemorrhage, ischemia, edema, mass effect or other acute intracranial process.  I reviewed radiology interpretation and agree their findings  of atrophy and chronic small vessel ischemic changes.  CT abdomen pelvis from interpretation shows bilateral kidney stones without evidence of ureteral stone, hydronephrosis or obstruction.  I reviewed radiology interpretation and agree to findings of same in addition to evidence of prior gastric resection, hiatal hernia repair and some spinal canal narrowing in the lumbar region.  There is also evidence of aortic atherosclerosis.   Renal ultrasound on my interpretation without evidence of hydronephrosis possibly right-sided kidney stone.  I also reviewed radiology interpretation and agree with their findings.   PROCEDURES:  Critical Care performed: No  .1-3 Lead EKG Interpretation  Performed by: Gilles Chiquito, MD Authorized by: Gilles Chiquito, MD     Interpretation: normal     ECG rate assessment: normal     Rhythm: sinus rhythm     Ectopy: none     Conduction: normal    The patient is on the cardiac monitor to evaluate for evidence of arrhythmia and/or significant heart rate changes.   MEDICATIONS ORDERED IN ED: Medications  sodium zirconium cyclosilicate (LOKELMA) packet 10 g (has no administration in time range)  lactated ringers bolus 2,000 mL (2,000 mLs Intravenous New Bag/Given 05/20/22 2246)     IMPRESSION / MDM / ASSESSMENT AND PLAN / ED COURSE  I reviewed the triage vital signs and the nursing notes. Patient's presentation is most consistent with acute presentation with potential threat to life or bodily function.                               Differential diagnosis includes, but is not limited to altered mental status secondary to electrolyte derangements, polypharmacy, endocrine derangement, UTI, intracranial hemorrhage with overall lower suspicion for CVA given absence of any clear focal deficits.  Lower suspicion for toxic ingestion.  Was able to review labs obtained in clinic earlier today that markable for TSH WNL, UA with some rare bacteria but otherwise  unremarkable for evidence of cystitis.  EKG shows sinus rhythm with a ventricular rate of 80, normal axis, unremarkable intervals with some artifact appears possibly related to a tremor in leads I and lead III without other clear evidence of acute ischemia.  Ischial troponin slightly elevated 18 which suspect represents a very mild demand ischemia with lower suspicion for occlusion MI although we can trend this.  Patient has no chest pain.  CBC in the emergency room shows no leukocytosis or acute anemia.  CMP emergency room remarkable for K of 6.3, bicarb of 18, glucose of 105, BUN of 76 and a creatinine of 2.54 compared to 1.174 months ago.  No evidence of acute hepatitis or cholestatic process.  Patient WNL not suggestive pancreatitis  CT head on my interpretation not evidence of hemorrhage, ischemia, edema, mass effect or other acute intracranial process.  I reviewed radiology interpretation and agree their findings of atrophy and chronic small vessel ischemic changes.  CT abdomen pelvis from interpretation shows bilateral kidney stones without evidence of ureteral stone, hydronephrosis or obstruction.  I reviewed radiology interpretation and agree to findings of same in addition to evidence of prior gastric resection, hiatal hernia repair and some spinal canal narrowing in the lumbar region.  There is also evidence of aortic atherosclerosis.   Renal ultrasound on my interpretation without evidence of hydronephrosis possibly right-sided kidney stone.  I also reviewed radiology interpretation and agree with their findings.  Suspect patient's confusion is likely secondary to polypharmacy and some uremia in the setting of AKI and significant dehydration.  She has no significant peaked T waves on her EKG.  Intervals are unremarkable.  We will start on IV fluids and give some Lokelma.  I will admit to medicine service for further evaluation and management.      FINAL CLINICAL IMPRESSION(S) / ED  DIAGNOSES   Final diagnoses:  Hyperkalemia  AKI (acute kidney injury) (HCC)  Altered mental status, unspecified altered mental status type  Aortic atherosclerosis (HCC)  Kidney stones     Rx / DC Orders   ED Discharge Orders     None        Note:  This document was prepared using Dragon voice recognition software and may include unintentional dictation errors.   Gilles Chiquito, MD 05/20/22 [phone]

## 2022-05-20 NOTE — ED Provider Triage Note (Signed)
Emergency Medicine Provider Triage Evaluation Note  Amanda [name] , a 81 y.o. female with a history of hypertension, anxiety and asthma was evaluated in triage.  Presents to the emergency department with concerns for hyperkalemia and elevated BUN/creatinine identified by patient's primary care provider earlier this morning.  Patient has been complaining of breathlessness since Saturday and patient's son thought that she was having a panic attack.  He states that her symptoms seemingly improved some over the weekend but have since worsened over the past 24 hours.  No prior history of hyperkalemia. Patient denies chest pain and chest tightness.  Review of Systems  Positive: Patient endorses complaints of weakness. Negative: No current chest pain.   Physical Exam  There were no vitals taken for this visit. Gen:   Patient seems confused. Resp:  Normal effort  MSK:   Moves extremities without difficulty    Medical Decision Making  Medically screening exam initiated at 4:49 PM.  Appropriate orders placed.  Amanda [name] was informed that the remainder of the evaluation will be completed by another provider, this initial triage assessment does not replace that evaluation, and the importance of remaining in the ED until their evaluation is complete.     Amanda [name] [place], [place] 05/20/22 1651

## 2022-05-20 NOTE — H&P (Addendum)
History and Physical    [name] [id number] DOB: 1941/03/22 DOA: 05/20/2022 DOS: the patient was seen and examined on 05/21/2022 PCP: Gladstone Lighter, MD  Patient coming from: Home  Chief Complaint:  Chief Complaint  Patient presents with   Abnormal Labs   HPI: Amanda [name] is a 81 y.o. female with medical history significant of hypertension, depression, vitamin D deficiency, CKD stage 3A, chronic back pain who presents to the emergency department after being referred from nephrologist due to hyperkalemia and worsening BUN/creatinine.  Patient states that she was initially evaluated because of confusion and concern for possible panic attack on 7/8.  Patient was unable to provide details regarding being in the ED, some of the history was obtained from ED physician and ED medical record.  Per report, patient had a panic attack on 7/8 which seemed to have improved.  Patient appears to have been taking her medication as directed, but there was concern regarding dehydration.  Patient denies any history of heart failure or kidney disease.   ED Course:  In the emergency department, she was hemodynamically stable, BP was 159/88 and other vital signs were within normal range.  Work-up in the ED showed normal CBC, BMP showed hyperkalemia, BUN/creatinine 76/2.54 (baseline creatinine at 1.2).  Troponin x2 was flat at 18, lipase 47, ammonia 20.  Urinalysis was unimpressive for UTI.  Urine drug screen was positive for TCA, opiates and benzodiazepine CT abdomen and pelvis without contrast showed nephrolithiasis of the bilateral kidneys largest in the lower pole of the right kidney measuring 10 mm.  No ureteral calculi or hydronephrosis. Renal/urinary tract ultrasound showed possible right kidney stone.  Otherwise negative ultrasound appearance of the kidneys CT head without contrast showed no evidence for acute intracranial abnormality. Lokelma was given, IV hydration was provided.   Hospitalist was asked to admit patient for further evaluation and management.  Review of Systems: Review of systems as noted in the HPI. All other systems reviewed and are negative.   Past Medical History:  Diagnosis Date   Anxiety    Asthma    Complication of anesthesia    difficulty waking up from anesthesia   GI bleed    Hiatal hernia    Hypertension    IDA (iron deficiency anemia)    Past Surgical History:  Procedure Laterality Date   ABDOMINAL HYSTERECTOMY     BACK SURGERY      lumbar fusion   BILATERAL CARPAL TUNNEL RELEASE     BREAST BIOPSY Left 01/17/2002   collapsed  lung Left    COLONOSCOPY  04/28/2012   COLONOSCOPY  02-12-15   Dr Bary Castilla   ESOPHAGEAL MANOMETRY N/A 06/13/2015   Procedure: ESOPHAGEAL MANOMETRY (EM);  Surgeon: Josefine Class, MD;  Location: [facility] ENDOSCOPY;  Service: Endoscopy;  Laterality: N/A;   ESOPHAGUS SURGERY  Oct 2016   Dr Duke Salvia in [place] ARTHROSCOPY Left    PARAESOPHAGEAL HERNIA REPAIR  07-26-15   Dr Duke Salvia   PARATHYROIDECTOMY Left    one gland on left removed   TOTAL HIP ARTHROPLASTY Left 05/12/2017   Procedure: TOTAL HIP ARTHROPLASTY ANTERIOR APPROACH;  Surgeon: Hessie Knows, MD;  Location: [facility] ORS;  Service: Orthopedics;  Laterality: Left;   TOTAL HIP ARTHROPLASTY Right 03/04/2018   Procedure: TOTAL HIP ARTHROPLASTY ANTERIOR APPROACH;  Surgeon: Hessie Knows, MD;  Location: [facility] ORS;  Service: Orthopedics;  Laterality: Right;   UPPER GI ENDOSCOPY  04/28/2012    Social History:  reports that she has quit  smoking. Her smoking use included cigarettes. She has a 30.00 pack-year smoking history. She has never used smokeless tobacco. She reports that she does not drink alcohol and does not use drugs.   Allergies  Allergen Reactions   Other Other (See Comments)    general anesthesia=difficulty waking   Codeine Nausea And Vomiting   Penicillins Hives    Has patient had a PCN reaction causing immediate rash, facial/tongue/throat  swelling, SOB or lightheadedness with hypotension: Yes Has patient had a PCN reaction causing severe rash involving mucus membranes or skin necrosis: Unknown Has patient had a PCN reaction that required hospitalization: No Has patient had a PCN reaction occurring within the last 10 years: No If all of the above answers are "NO", then may proceed with Cephalosporin use.     Family history: No pertinent family history.   Prior to Admission medications   Medication Sig Start Date End Date Taking? Authorizing Provider  carvedilol (COREG) 6.25 MG tablet Take 6.25 mg by mouth daily.  Patient not taking: Reported on 04/09/2022 02/08/15   [provider]  cetirizine (ZYRTEC) 10 MG tablet Take 10 mg by mouth daily. Patient not taking: Reported on 04/09/2022 06/06/20   [provider]  Cholecalciferol 25 MCG (1000 UT) CHEW Chew 1 tablet by mouth daily.    [provider]  colestipol (COLESTID) 1 g tablet Take 2 tablets (2 g total) by mouth daily. 04/11/21 05/11/21  Pasty Spillers, MD  Cyanocobalamin (B-12 PO) Take 1 capsule by mouth daily.    [provider]  diclofenac Sodium (VOLTAREN) 1 % GEL Apply topically 4 (four) times daily as needed.    [provider]  diphenoxylate-atropine (LOMOTIL) 2.5-0.025 MG tablet Take 2 tablets by mouth 4 (four) times daily as needed for diarrhea or loose stools. Patient not taking: Reported on 04/09/2022    [provider]  DULoxetine (CYMBALTA) 20 MG capsule Take 20 mg by mouth 2 (two) times daily.    [provider]  HYDROcodone-acetaminophen (NORCO/VICODIN) 5-325 MG tablet Take 1 tablet by mouth every 12 (twelve) hours. Must last 30 days. 05/09/22 06/08/22  Edward Jolly, MD  lipase/protease/amylase (CREON) 36000 UNITS CPEP capsule Take 36,000 Units by mouth 3 (three) times daily with meals. Patient not taking: Reported on 04/09/2022    [provider]  meclizine (ANTIVERT) 12.5 MG tablet Take 1  tablet by mouth daily. Patient not taking: Reported on 04/09/2022 12/04/20   [provider]  oxybutynin (DITROPAN) 5 MG tablet Take 5 mg by mouth 2 (two) times daily. Patient not taking: Reported on 04/09/2022 08/13/20   [provider]  pregabalin (LYRICA) 25 MG capsule Take 1 capsule (25 mg total) by mouth at bedtime for 15 days, THEN 1 capsule (25 mg total) 2 (two) times daily. 03/18/22 05/02/22  Edward Jolly, MD  triamterene-hydrochlorothiazide (MAXZIDE-25) 37.5-25 MG tablet Take 1 tablet by mouth daily. 02/18/20   [provider]  valsartan (DIOVAN) 160 MG tablet Take 160 mg by mouth daily.    [provider]    Physical Exam: BP 122/75   Pulse 67   Temp (!) 97.4 F (36.3 C) (Oral)   Resp 15   SpO2 99%   General: 81 y.o. year-old female well developed well nourished in no acute distress.  Alert and oriented x3. HEENT: NCAT, EOMI, dry mucous membrane Neck: Supple, trachea medial Cardiovascular: Regular rate and rhythm with no rubs or gallops.  No thyromegaly or JVD noted.  No lower extremity edema.  2/4 pulses in all 4 extremities. Respiratory: Clear to auscultation with no wheezes or rales. Good inspiratory effort. Abdomen: Soft, nontender nondistended with normal bowel sounds x4 quadrants. Muskuloskeletal: No cyanosis, clubbing or edema noted bilaterally Neuro: CN II-XII intact, strength 5/5 x 4, sensation, reflexes intact Skin: No ulcerative lesions noted or rashes Psychiatry: Mood is appropriate for condition and setting          Labs on Admission:  Basic Metabolic Panel: Recent Labs  Lab 05/20/22 1658  NA 139  K 6.3*  CL 112*  CO2 18*  GLUCOSE 105*  BUN 76*  CREATININE 2.54*  CALCIUM 8.9   Liver Function Tests: Recent Labs  Lab 05/20/22 1658  AST 18  ALT 12  ALKPHOS 63  BILITOT 0.8  PROT 6.9  ALBUMIN 4.0   Recent Labs  Lab 05/20/22 1658  LIPASE 47   Recent Labs  Lab 05/20/22 2342  AMMONIA 20   CBC: Recent Labs   Lab 05/20/22 1658  WBC 4.7  NEUTROABS 2.8  HGB 12.1  HCT 37.8  MCV 88.7  PLT 183   Cardiac Enzymes: No results for input(s): "CKTOTAL", "CKMB", "CKMBINDEX", "TROPONINI" in the last 168 hours.  BNP (last 3 results) No results for input(s): "BNP" in the last 8760 hours.  ProBNP (last 3 results) No results for input(s): "PROBNP" in the last 8760 hours.  CBG: No results for input(s): "GLUCAP" in the last 168 hours.  Radiological Exams on Admission: CT Renal Stone Study  Result Date: 05/20/2022 CLINICAL DATA:  A female at age 88 presents for evaluation of nephrolithiasis. EXAM: CT ABDOMEN AND PELVIS WITHOUT CONTRAST TECHNIQUE: Multidetector CT imaging of the abdomen and pelvis was performed following the standard protocol without IV contrast. RADIATION DOSE REDUCTION: This exam was performed according to the departmental dose-optimization program which includes automated exposure control, adjustment of the mA and/or kV according to patient size and/or use of iterative reconstruction technique. COMPARISON:  February 09, 2015. FINDINGS: Lower chest: Basilar atelectasis. No effusion. No consolidative changes. Mitral annular calcifications. No pericardial effusion. Heart is incompletely imaged. Hepatobiliary: No focal hepatic lesion to the extent evaluated on noncontrast imaging. No pericholecystic stranding. Mild sludge in the gallbladder. No gross biliary duct distension. Pancreas: Pancreatic atrophy without signs of inflammation. Spleen: Normal. Adrenals/Urinary Tract: 1.8 cm RIGHT adrenal lesion similar to previous imaging and with density value of 6 Hounsfield units compatible with adrenal adenoma for which no dedicated additional imaging follow-up is recommended. Nephrolithiasis of the bilateral kidneys largest in the lower pole of the RIGHT kidney measuring 10 mm (image 30/2) No ureteral calculi. No hydronephrosis. No perinephric stranding. Smooth renal contours. Urinary bladder with smooth  contours, under distended without perivesical stranding. Limited assessment of pelvic structures due to streak artifact from hip arthroplasties bilaterally. Stomach/Bowel: Partial gastric resection and hiatal hernia repair since prior imaging. No stranding adjacent to the stomach. No sign of small bowel dilation. Normal appendix. Colonic diverticulosis without diverticulitis, moderate to marked diverticular changes of the sigmoid slightly increased from previous imaging. Vascular/Lymphatic: Aortic atherosclerosis. No sign of aneurysm. Smooth contour of the IVC. There is no gastrohepatic or hepatoduodenal ligament lymphadenopathy. No retroperitoneal or mesenteric lymphadenopathy. No pelvic sidewall lymphadenopathy. Limited assessment due to lack of intravenous contrast with respect to vascular structures. Reproductive: Post hysterectomy. Other: No ascites or free air. Musculoskeletal: Spinal degenerative changes. No acute or destructive bone process. Bilateral hip arthroplasty changes. Moderate to marked spinal canal narrowing at the L2-3 level due to facet arthropathy and discogenic changes  and there is mild anterolisthesis of L2 on L3 that is increased compared to more remote imaging from 2016 but is similar compared to MRI imaging from March 09, 2019. Post laminectomy at the L4-5 level. IMPRESSION: 1. Nephrolithiasis of the bilateral kidneys largest in the lower pole of the RIGHT kidney measuring 10 mm. No ureteral calculi or hydronephrosis. 2. Partial gastric resection and hiatal hernia repair since prior imaging. 3. Moderate to marked spinal canal narrowing at L2-3 due to facet arthropathy and discogenic changes similar to previous MR imaging. Aortic Atherosclerosis (ICD10-I70.0). Electronically Signed   By: Zetta Bills M.D.   On: 05/20/2022 22:33   US Renal  Result Date: 05/20/2022 CLINICAL DATA:  Acute kidney injury EXAM: RENAL / URINARY TRACT ULTRASOUND COMPLETE COMPARISON:  Radiograph 05/06/2021  FINDINGS: Right Kidney: Renal measurements: 8.4 x 3.9 x 3.8 cm = volume: 63 mL. Echogenicity normal. No mass or hydronephrosis. Possible stone at the lower pole measuring 9 mm. Left Kidney: Renal measurements: 8.6 x 3.9 x 3.8 cm = volume: 65 mL. Echogenicity within normal limits. No mass or hydronephrosis visualized. Bladder: Appears normal for degree of bladder distention. Other: None. IMPRESSION: Possible right kidney stone. Otherwise negative ultrasound appearance of the kidneys Electronically Signed   By: Donavan Foil M.D.   On: 05/20/2022 20:04   CT Head Wo Contrast  Result Date: 05/20/2022 CLINICAL DATA:  Mental status change EXAM: CT HEAD WITHOUT CONTRAST TECHNIQUE: Contiguous axial images were obtained from the base of the skull through the vertex without intravenous contrast. RADIATION DOSE REDUCTION: This exam was performed according to the departmental dose-optimization program which includes automated exposure control, adjustment of the mA and/or kV according to patient size and/or use of iterative reconstruction technique. COMPARISON:  CT 01/09/2022 FINDINGS: Brain: No acute territorial infarction, hemorrhage, or intracranial mass. Mild atrophy. Mild chronic small vessel ischemic changes of the white matter. 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 Stable ventricle size Vascular: No hyperdense vessels.  Carotid vascular calcification Skull: Normal. Negative for fracture or focal lesion. Sinuses/Orbits: No acute finding. Other: None IMPRESSION: 1. No CT evidence for acute intracranial abnormality. 2. Atrophy and chronic small vessel ischemic changes of the white matter Electronically Signed   By: Donavan Foil M.D.   On: 05/20/2022 17:39    EKG: I independently viewed the EKG done and my findings are as followed: Normal sinus rhythm at a rate of 80 bpm  Assessment/Plan Present on Admission:  Hyperkalemia  Principal Problem:   Hyperkalemia Active Problems:   Essential  hypertension   Acute metabolic encephalopathy   Chronic back pain   Nephrolithiasis   Dehydration   Vitamin D deficiency   AKI (acute kidney injury) (HCC)   Elevated troponin   Mixed hyperlipidemia  Hyperkalemia K+ 6.3 No EKG changes IV NS 500 mL bolus was provided Lokelma was given Continue to monitor potassium level with morning labs  Acute metabolic encephalopathy Urine drug screen was positive for TCA, opiates and benzodiazepine CNS acting drugs will be temporarily held at this time  Acute kidney injury BUN/creatinine 76/2.54 (baseline creatinine at 1.2).  Renally adjust medications, avoid nephrotoxic agents/dehydration/hypotension  Nephrolithiasis CT (noncontrast showed nephrolithiasis with bilateral kidneys with no ureteral calculi or hydronephrosis  Elevated troponin possible secondary to reactive process Troponin x2 was flat at 18.    Essential hypertension BP meds will be held at this time due to soft BP  Dehydration Continue IV hydration  Vitamin D deficiency Continue cholecalciferol  Chronic back pain Continue  Tylenol as needed Norco will be temporarily held at this time due to acute metabolic encephalopathy  Mixed hyperlipidemia Continue colestipol    DVT prophylaxis: Heparin subcu  Code Status: Full code  Consults: None  Family Communication: None at bedside  Severity of Illness: The appropriate patient status for this patient is OBSERVATION. Observation status is judged to be reasonable and necessary in order to provide the required intensity of service to ensure the patient's safety. The patient's presenting symptoms, physical exam findings, and initial radiographic and laboratory data in the context of their medical condition is felt to place them at decreased risk for further clinical deterioration. Furthermore, it is anticipated that the patient will be medically stable for discharge from the hospital within 2 midnights of admission.    Author: Frankey Shown, DO 05/21/2022 1:41 AM  For on call review [email or web address].

## 2022-05-21 ENCOUNTER — Encounter: Payer: Self-pay | Admitting: Internal Medicine

## 2022-05-21 DIAGNOSIS — G8929 Other chronic pain: Secondary | ICD-10-CM

## 2022-05-21 DIAGNOSIS — N179 Acute kidney failure, unspecified: Secondary | ICD-10-CM

## 2022-05-21 DIAGNOSIS — E559 Vitamin D deficiency, unspecified: Secondary | ICD-10-CM

## 2022-05-21 DIAGNOSIS — E860 Dehydration: Secondary | ICD-10-CM

## 2022-05-21 DIAGNOSIS — N200 Calculus of kidney: Secondary | ICD-10-CM

## 2022-05-21 DIAGNOSIS — E782 Mixed hyperlipidemia: Secondary | ICD-10-CM

## 2022-05-21 DIAGNOSIS — G9341 Metabolic encephalopathy: Secondary | ICD-10-CM

## 2022-05-21 DIAGNOSIS — R778 Other specified abnormalities of plasma proteins: Secondary | ICD-10-CM

## 2022-05-21 DIAGNOSIS — E875 Hyperkalemia: Secondary | ICD-10-CM | POA: Diagnosis not present

## 2022-05-21 LAB — COMPREHENSIVE METABOLIC PANEL
ALT: 10 U/L (ref 0–44)
AST: 16 U/L (ref 15–41)
Albumin: 3.4 g/dL — ABNORMAL LOW (ref 3.5–5.0)
Alkaline Phosphatase: 43 U/L (ref 38–126)
Anion gap: 3 — ABNORMAL LOW (ref 5–15)
BUN: 59 mg/dL — ABNORMAL HIGH (ref 8–23)
CO2: 21 mmol/L — ABNORMAL LOW (ref 22–32)
Calcium: 8.2 mg/dL — ABNORMAL LOW (ref 8.9–10.3)
Chloride: 114 mmol/L — ABNORMAL HIGH (ref 98–111)
Creatinine, Ser: 1.65 mg/dL — ABNORMAL HIGH (ref 0.44–1.00)
GFR, Estimated: 31 mL/min — ABNORMAL LOW (ref >60)
Glucose, Bld: 92 mg/dL (ref 70–99)
Potassium: 5.5 mmol/L — ABNORMAL HIGH (ref 3.5–5.1)
Sodium: 138 mmol/L (ref 135–145)
Total Bilirubin: 0.8 mg/dL (ref 0.3–1.2)
Total Protein: 6 g/dL — ABNORMAL LOW (ref 6.5–8.1)

## 2022-05-21 LAB — AMMONIA: Ammonia: 20 umol/L (ref 9–35)

## 2022-05-21 LAB — PHOSPHORUS: Phosphorus: 3.9 mg/dL (ref 2.5–4.6)

## 2022-05-21 LAB — MAGNESIUM: Magnesium: 2.4 mg/dL (ref 1.7–2.4)

## 2022-05-21 MED ORDER — GABAPENTIN 300 MG PO CAPS
300.0000 mg | ORAL_CAPSULE | Freq: Two times a day (BID) | ORAL | Status: DC
  Administered 2022-05-21 – 2022-05-22 (×3): 300 mg via ORAL
  Filled 2022-05-21 (×3): qty 1

## 2022-05-21 MED ORDER — ACETAMINOPHEN 325 MG PO TABS: 650.0000 mg | ORAL_TABLET | Freq: Four times a day (QID) | ORAL | Status: DC | PRN

## 2022-05-21 MED ORDER — CITALOPRAM HYDROBROMIDE 20 MG PO TABS
10.0000 mg | ORAL_TABLET | Freq: Every day | ORAL | Status: DC
  Administered 2022-05-21 – 2022-05-22 (×2): 10 mg via ORAL
  Filled 2022-05-21 (×2): qty 1

## 2022-05-21 MED ORDER — SODIUM CHLORIDE 0.9 % IV SOLN
INTRAVENOUS | Status: AC
Start: 2022-05-21 — End: 2022-05-21

## 2022-05-21 MED ORDER — SODIUM CHLORIDE 0.9 % IV SOLN
INTRAVENOUS | Status: AC
Start: 2022-05-21 — End: 2022-05-22

## 2022-05-21 MED ORDER — OXYBUTYNIN CHLORIDE 5 MG PO TABS
5.0000 mg | ORAL_TABLET | Freq: Two times a day (BID) | ORAL | Status: DC
  Administered 2022-05-21: 5 mg via ORAL
  Filled 2022-05-21: qty 1

## 2022-05-21 MED ORDER — HEPARIN SODIUM (PORCINE) 5000 UNIT/ML IJ SOLN: 5000.0000 [IU] | Freq: Three times a day (TID) | INTRAMUSCULAR | Status: DC

## 2022-05-21 MED ORDER — HEPARIN SODIUM (PORCINE) 5000 UNIT/ML IJ SOLN
5000.0000 [IU] | Freq: Three times a day (TID) | INTRAMUSCULAR | Status: DC
Start: 2022-05-21 — End: 2022-05-22
  Administered 2022-05-21 – 2022-05-22 (×4): 5000 [IU] via SUBCUTANEOUS
  Filled 2022-05-21 (×4): qty 1

## 2022-05-21 MED ORDER — DULOXETINE HCL 20 MG PO CPEP
20.0000 mg | ORAL_CAPSULE | Freq: Two times a day (BID) | ORAL | Status: DC
  Administered 2022-05-21 – 2022-05-22 (×3): 20 mg via ORAL
  Filled 2022-05-21 (×3): qty 1

## 2022-05-21 MED ORDER — CARVEDILOL 6.25 MG PO TABS
6.2500 mg | ORAL_TABLET | Freq: Every day | ORAL | Status: DC
  Administered 2022-05-21: 6.25 mg via ORAL
  Filled 2022-05-21: qty 1

## 2022-05-21 MED ORDER — PREGABALIN 25 MG PO CAPS
25.0000 mg | ORAL_CAPSULE | Freq: Two times a day (BID) | ORAL | Status: DC
  Administered 2022-05-21: 25 mg via ORAL
  Filled 2022-05-21: qty 1

## 2022-05-21 MED ORDER — ACETAMINOPHEN 650 MG RE SUPP: 650.0000 mg | Freq: Four times a day (QID) | RECTAL | Status: DC | PRN

## 2022-05-21 MED ORDER — COLESTIPOL HCL 1 G PO TABS
2.0000 g | ORAL_TABLET | Freq: Every day | ORAL | Status: DC
  Administered 2022-05-22: 2 g via ORAL
  Filled 2022-05-21 (×2): qty 2

## 2022-05-21 MED ORDER — VITAMIN D 25 MCG (1000 UNIT) PO TABS
1000.0000 [IU] | ORAL_TABLET | Freq: Every day | ORAL | Status: DC
  Administered 2022-05-22: 1000 [IU] via ORAL
  Filled 2022-05-21: qty 1

## 2022-05-21 MED ORDER — SODIUM CHLORIDE 0.9 % IV BOLUS
500.0000 mL | Freq: Once | INTRAVENOUS | Status: AC
  Administered 2022-05-21: 500 mL via INTRAVENOUS

## 2022-05-21 NOTE — Evaluation (Signed)
Occupational Therapy Evaluation Patient Details Name: Amanda [name] MRN: [id number] DOB: 27-May-1941 Today's Date: 05/21/2022   History of Present Illness Amanda [name] is a 81 y.o. female with medical history significant of hypertension, depression, vitamin D deficiency, CKD stage 3A, chronic back pain who presents to the emergency department after being referred from nephrologist due to hyperkalemia and worsening BUN/creatinine.  Patient states that she was initially evaluated because of confusion and concern for possible panic attack on 7/8.   Clinical Impression   Amanda [name] was seen for OT evaluation this date. Prior to hospital admission, pt was MOD I using SPC for mobility and ADLs. Pt lives with spouse in home c 5 STE. Pt presents to acute OT demonstrating impaired ADL performance and functional mobility 2/2 decreased activity tolerance and functional strength/ROM/balance deficits. Pt currently requires SBA + SPC for toilet t/f ~30 ft and pericare in standing. SUPERVISION grooming standing sinkside with intermittent UE support. Pt deferred use of RW, prefers [facility] however reaching for support on furniture intermittently. SpO2 mid 90s on RA, decreased to 88% with trunk flexion for standing pericare, resolved with upright posture. Pt would benefit from skilled OT to address noted impairments and functional limitations (see below for any additional details). Upon hospital discharge, recommend HHOT to maximize pt safety and return to PLOF.    Recommendations for follow up therapy are one component of a multi-disciplinary discharge planning process, led by the attending physician.  Recommendations may be updated based on patient status, additional functional criteria and insurance authorization.   Follow Up Recommendations  Home health OT    Assistance Recommended at Discharge Set up Supervision/Assistance  Patient can return home with the following A little help with walking and/or transfers;A  little help with bathing/dressing/bathroom    Functional Status Assessment  Patient has had a recent decline in their functional status and demonstrates the ability to make significant improvements in function in a reasonable and predictable amount of time.  Equipment Recommendations  BSC/3in1    Recommendations for Other Services       Precautions / Restrictions Precautions Precautions: Fall Restrictions Weight Bearing Restrictions: No      Mobility Bed Mobility Overal bed mobility: Modified Independent                  Transfers Overall transfer level: Needs assistance Equipment used: Straight cane Transfers: Sit to/from Stand Sit to Stand: Min guard                  Balance Overall balance assessment: Needs assistance Sitting-balance support: No upper extremity supported Sitting balance-Leahy Scale: Good     Standing balance support: Single extremity supported, During functional activity Standing balance-Leahy Scale: Fair                             ADL either performed or assessed with clinical judgement   ADL Overall ADL's : Needs assistance/impaired                                       General ADL Comments: SBA + SPC for toilet t/f ~30 ft and pericare in standing. SUPERVISION grooming standing sinkside with intermittent UE support. Pt deferred use of RW, prefers [facility] however reaching for support on furniture intermittently.       Pertinent Vitals/Pain Pain Assessment Pain Assessment: No/denies pain  Hand Dominance     Extremity/Trunk Assessment Upper Extremity Assessment Upper Extremity Assessment: Overall WFL for tasks assessed   Lower Extremity Assessment Lower Extremity Assessment: Generalized weakness       Communication Communication Communication: No difficulties   Cognition Arousal/Alertness: Awake/alert Behavior During Therapy: WFL for tasks assessed/performed Overall Cognitive Status:  Within Functional Limits for tasks assessed                                       General Comments  SpO2 mid 90s on RA, decreased to 88% with trunk flexion for standing pericare, resolved with upright posture.     Home Living Family/patient expects to be discharged to:: Private residence Living Arrangements: Spouse/significant other Available Help at Discharge: Family Type of Home: House Home Access: Stairs to enter Entrance Stairs-Number of Steps: 5                   Home Equipment: Agricultural consultant (2 wheels);Cane - single point   Additional Comments: husband is retired but still frequently works, sons live nearby      Prior Functioning/Environment Prior Level of Function : Independent/Modified Independent             Mobility Comments: MOD I using SPC          OT Problem List: Decreased strength;Decreased range of motion;Decreased activity tolerance;Impaired balance (sitting and/or standing);Decreased safety awareness      OT Treatment/Interventions: Therapeutic exercise;Self-care/ADL training;Energy conservation;DME and/or AE instruction;Therapeutic activities;Patient/family education;Balance training    OT Goals(Current goals can be found in the care plan section) Acute Rehab OT Goals Patient Stated Goal: to go home OT Goal Formulation: With patient Time For Goal Achievement: 06/04/22 Potential to Achieve Goals: Good  OT Frequency: Min 2X/week    Co-evaluation              AM-PAC OT "6 Clicks" Daily Activity     Outcome Measure Help from another person eating meals?: None Help from another person taking care of personal grooming?: A Little Help from another person toileting, which includes using toliet, bedpan, or urinal?: A Little Help from another person bathing (including washing, rinsing, drying)?: A Little Help from another person to put on and taking off regular upper body clothing?: None Help from another person to put on  and taking off regular lower body clothing?: A Little 6 Click Score: 20   End of Session Nurse Communication: Mobility status  Activity Tolerance: Patient tolerated treatment well Patient left: in bed;with call bell/phone within reach;with nursing/sitter in room  OT Visit Diagnosis: Other abnormalities of gait and mobility (R26.89);Muscle weakness (generalized) (M62.81)                Time: 4696-2952 OT Time Calculation (min): 22 min Charges:  OT General Charges $OT Visit: 1 Visit OT Evaluation $OT Eval Moderate Complexity: 1 Mod OT Treatments $Self Care/Home Management : 8-22 mins  Kathie Dike, M.S. OTR/L  05/21/22, 12:42 PM  ascom [phone]

## 2022-05-21 NOTE — Progress Notes (Signed)
PROGRESS NOTE    Amanda [name]  WGN:[id number] DOB: 02/27/1941 DOA: 05/20/2022 PCP: Enid Baas, MD    Brief Narrative:  81 year old female with history of hypertension, depression, vitamin D deficiency, CKD stage IIIa, chronic back pain presents to the emergency room from primary care physician's office due to hyperkalemia and worsening BUN/creatinine.  Patient apparently evaluated for confusion and possible panic attack on 7/8 that seem to be improved.  In the emergency room hemodynamically stable.  BUN/creatinine 76/2.54 with recent creatinine 1.2.  Potassium 6.5.  CT scan abdomen pelvis without hydronephrosis.  Started on Lokelma, IV hydration and admitted to the hospital.   Assessment & Plan:   Acute renal failure on CKD stage IIIa: Hyperkalemia: Already improving.  Continue gentle IV fluids.  Low potassium diet.  Recheck levels tomorrow morning.  No EKG changes. No evidence of hydronephrosis or obstruction.  Nonobstructive kidney stones.  Will need close outpatient follow-up. Intake output monitoring.  Measure urinary output.  Acute metabolic encephalopathy: Urine drug screen positive for tricyclic's, opiates and benzodiazepine. Patient on SSRI and Norco, however did not see any benzodiazepine. There is a report that she might have used all of her pain medications before hand, will get more information with husband. Work with PT OT. Hold all narcotics today.  Will resume Lyrica, gabapentin and Cymbalta.  Essential hypertension: Blood pressure fairly stable.  Due to acute renal failure, discontinue triamterene hydrochlorothiazide and Diovan for now.  Chronic pain syndrome: Followed by pain clinic.  Looks like she is on multiple medications.  Will need more careful monitoring of narcotic use at home.   DVT prophylaxis: heparin injection 5,000 Units Start: 05/21/22 0745 SCDs Start: 05/21/22 0743 SCDs Start: 05/21/22 0743   Code Status: Full code Family Communication:  Unsuccessful to reach husband on the phone Disposition Plan: Status is: Observation The patient will require care spanning > 2 midnights and should be moved to inpatient because: IV antibiotics, significant electrolyte abnormalities     Consultants:  None  Procedures:  None  Antimicrobials:  None   Subjective: Patient seen and examined.  Remains in the emergency room.  She denies any complaints.  She is not sure if she was confused.  Patient tells me that she was sent from [facility] clinic but nothing wrong with her.  Repeat potassium improving.  No family at bedside.  Objective: Vitals:   05/21/22 0500 05/21/22 0942 05/21/22 1133 05/21/22 1200  BP: 125/67 105/62 122/77   Pulse: 64 73 (!) 58   Resp: (!) 23 19 17    Temp:  97.7 F (36.5 C) 97.8 F (36.6 C)   TempSrc:  Oral Oral   SpO2: 95% 91% 98%   Height:    5\' 5"  (1.651 m)    Intake/Output Summary (Last 24 hours) at 05/21/2022 1436 Last data filed at 05/21/2022 0155 Gross per 24 hour  Intake 2000 ml  Output --  Net 2000 ml   There were no vitals filed for this visit.  Examination:  General exam: Appears calm and comfortable  Sick looking lethargic but not in any distress.  Flat affect. Alert and awake oriented x3-4.  Responds appropriately but slow to respond. Respiratory system: No added sounds. Cardiovascular system: S1 & S2 heard, RRR. No pedal edema. Gastrointestinal system: Soft.  Nontender.  Bowel sound present.   Central nervous system: Alert and oriented. No focal neurological deficits. Extremities: Symmetric 5 x 5 power.  Generalized weakness.   Data Reviewed: I have personally reviewed following labs and imaging studies  CBC: Recent Labs  Lab 05/20/22 1658  WBC 4.7  NEUTROABS 2.8  HGB 12.1  HCT 37.8  MCV 88.7  PLT [id number]   Basic Metabolic Panel: Recent Labs  Lab 05/20/22 1658 05/21/22 0813  NA 139 138  K 6.3* 5.5*  CL 112* 114*  CO2 18* 21*  GLUCOSE 105* 92  BUN 76* 59*  CREATININE  2.54* 1.65*  CALCIUM 8.9 8.2*  MG  --  2.4  PHOS  --  3.9   GFR: CrCl cannot be calculated (Unknown ideal weight.). Liver Function Tests: Recent Labs  Lab 05/20/22 1658 05/21/22 0813  AST 18 16  ALT 12 10  ALKPHOS 63 43  BILITOT 0.8 0.8  PROT 6.9 6.0*  ALBUMIN 4.0 3.4*   Recent Labs  Lab 05/20/22 1658  LIPASE 47   Recent Labs  Lab 05/20/22 2342  AMMONIA 20   Coagulation Profile: No results for input(s): "INR", "PROTIME" in the last 168 hours. Cardiac Enzymes: No results for input(s): "CKTOTAL", "CKMB", "CKMBINDEX", "TROPONINI" in the last 168 hours. BNP (last 3 results) No results for input(s): "PROBNP" in the last 8760 hours. HbA1C: No results for input(s): "HGBA1C" in the last 72 hours. CBG: No results for input(s): "GLUCAP" in the last 168 hours. Lipid Profile: No results for input(s): "CHOL", "HDL", "LDLCALC", "TRIG", "CHOLHDL", "LDLDIRECT" in the last 72 hours. Thyroid Function Tests: No results for input(s): "TSH", "T4TOTAL", "FREET4", "T3FREE", "THYROIDAB" in the last 72 hours. Anemia Panel: No results for input(s): "VITAMINB12", "FOLATE", "FERRITIN", "TIBC", "IRON", "RETICCTPCT" in the last 72 hours. Sepsis Labs: No results for input(s): "PROCALCITON", "LATICACIDVEN" in the last 168 hours.  No results found for this or any previous visit (from the past 240 hour(s)).       Radiology Studies: CT Renal Stone Study  Result Date: 05/20/2022 CLINICAL DATA:  A female at age 62 presents for evaluation of nephrolithiasis. EXAM: CT ABDOMEN AND PELVIS WITHOUT CONTRAST TECHNIQUE: Multidetector CT imaging of the abdomen and pelvis was performed following the standard protocol without IV contrast. RADIATION DOSE REDUCTION: This exam was performed according to the departmental dose-optimization program which includes automated exposure control, adjustment of the mA and/or kV according to patient size and/or use of iterative reconstruction technique. COMPARISON:   February 09, 2015. FINDINGS: Lower chest: Basilar atelectasis. No effusion. No consolidative changes. Mitral annular calcifications. No pericardial effusion. Heart is incompletely imaged. Hepatobiliary: No focal hepatic lesion to the extent evaluated on noncontrast imaging. No pericholecystic stranding. Mild sludge in the gallbladder. No gross biliary duct distension. Pancreas: Pancreatic atrophy without signs of inflammation. Spleen: Normal. Adrenals/Urinary Tract: 1.8 cm RIGHT adrenal lesion similar to previous imaging and with density value of 6 Hounsfield units compatible with adrenal adenoma for which no dedicated additional imaging follow-up is recommended. Nephrolithiasis of the bilateral kidneys largest in the lower pole of the RIGHT kidney measuring 10 mm (image 30/2) No ureteral calculi. No hydronephrosis. No perinephric stranding. Smooth renal contours. Urinary bladder with smooth contours, under distended without perivesical stranding. Limited assessment of pelvic structures due to streak artifact from hip arthroplasties bilaterally. Stomach/Bowel: Partial gastric resection and hiatal hernia repair since prior imaging. No stranding adjacent to the stomach. No sign of small bowel dilation. Normal appendix. Colonic diverticulosis without diverticulitis, moderate to marked diverticular changes of the sigmoid slightly increased from previous imaging. Vascular/Lymphatic: Aortic atherosclerosis. No sign of aneurysm. Smooth contour of the IVC. There is no gastrohepatic or hepatoduodenal ligament lymphadenopathy. No retroperitoneal or mesenteric lymphadenopathy. No pelvic sidewall lymphadenopathy. Limited  assessment due to lack of intravenous contrast with respect to vascular structures. Reproductive: Post hysterectomy. Other: No ascites or free air. Musculoskeletal: Spinal degenerative changes. No acute or destructive bone process. Bilateral hip arthroplasty changes. Moderate to marked spinal canal narrowing at the  L2-3 level due to facet arthropathy and discogenic changes and there is mild anterolisthesis of L2 on L3 that is increased compared to more remote imaging from 2016 but is similar compared to MRI imaging from March 09, 2019. Post laminectomy at the L4-5 level. IMPRESSION: 1. Nephrolithiasis of the bilateral kidneys largest in the lower pole of the RIGHT kidney measuring 10 mm. No ureteral calculi or hydronephrosis. 2. Partial gastric resection and hiatal hernia repair since prior imaging. 3. Moderate to marked spinal canal narrowing at L2-3 due to facet arthropathy and discogenic changes similar to previous MR imaging. Aortic Atherosclerosis (ICD10-I70.0). Electronically Signed   By: Donzetta Kohut M.D.   On: 05/20/2022 22:33   US Renal  Result Date: 05/20/2022 CLINICAL DATA:  Acute kidney injury EXAM: RENAL / URINARY TRACT ULTRASOUND COMPLETE COMPARISON:  Radiograph 05/06/2021 FINDINGS: Right Kidney: Renal measurements: 8.4 x 3.9 x 3.8 cm = volume: 63 mL. Echogenicity normal. No mass or hydronephrosis. Possible stone at the lower pole measuring 9 mm. Left Kidney: Renal measurements: 8.6 x 3.9 x 3.8 cm = volume: 65 mL. Echogenicity within normal limits. No mass or hydronephrosis visualized. Bladder: Appears normal for degree of bladder distention. Other: None. IMPRESSION: Possible right kidney stone. Otherwise negative ultrasound appearance of the kidneys Electronically Signed   By: Jasmine Pang M.D.   On: 05/20/2022 20:04   CT Head Wo Contrast  Result Date: 05/20/2022 CLINICAL DATA:  Mental status change EXAM: CT HEAD WITHOUT CONTRAST TECHNIQUE: Contiguous axial images were obtained from the base of the skull through the vertex without intravenous contrast. RADIATION DOSE REDUCTION: This exam was performed according to the departmental dose-optimization program which includes automated exposure control, adjustment of the mA and/or kV according to patient size and/or use of iterative reconstruction  technique. COMPARISON:  CT 01/09/2022 FINDINGS: Brain: No acute territorial infarction, hemorrhage, or intracranial mass. Mild atrophy. Mild chronic small vessel ischemic changes of the white matter. 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 Stable ventricle size Vascular: No hyperdense vessels.  Carotid vascular calcification Skull: Normal. Negative for fracture or focal lesion. Sinuses/Orbits: No acute finding. Other: None IMPRESSION: 1. No CT evidence for acute intracranial abnormality. 2. Atrophy and chronic small vessel ischemic changes of the white matter Electronically Signed   By: Jasmine Pang M.D.   On: 05/20/2022 17:39        Scheduled Meds:  carvedilol  6.25 mg Oral Daily   cholecalciferol  1,000 Units Oral Daily   citalopram  10 mg Oral Daily   colestipol  2 g Oral Daily   DULoxetine  20 mg Oral BID   gabapentin  300 mg Oral BID   heparin  5,000 Units Subcutaneous Q8H   oxybutynin  5 mg Oral BID   pregabalin  25 mg Oral BID   Continuous Infusions:  sodium chloride       LOS: 0 days    Time spent: 35 minutes    Dorcas Carrow, MD [facility] Pager [phone]

## 2022-05-21 NOTE — Evaluation (Signed)
Physical Therapy Evaluation Patient Details Name: Amanda [name] MRN: [id number] DOB: Mar 04, 1941 Today's Date: 05/21/2022  History of Present Illness  Amanda [name] is a 81 y.o. female with medical history significant of hypertension, depression, vitamin D deficiency, CKD stage 3A, chronic back pain who presents to the emergency department after being referred from nephrologist due to hyperkalemia and worsening BUN/creatinine.  Patient states that she was initially evaluated because of confusion and concern for possible panic attack on 7/8.  Clinical Impression  Patient received in bed, husband at bedside. She is mod independent with bed mobility and sit to stand. She ambulated 30 feet with SPC and hand held assist, then ambualted 10-15 feet with RW and min guard. Patient has improved safety with RW at this time. She will continue to benefit from skilled PT to improve safety and strength for return home.        Recommendations for follow up therapy are one component of a multi-disciplinary discharge planning process, led by the attending physician.  Recommendations may be updated based on patient status, additional functional criteria and insurance authorization.  Follow Up Recommendations Home health PT      Assistance Recommended at Discharge Frequent or constant Supervision/Assistance  Patient can return home with the following  A little help with walking and/or transfers;A little help with bathing/dressing/bathroom;Assistance with cooking/housework;Assist for transportation;Help with stairs or ramp for entrance;Direct supervision/assist for medications management    Equipment Recommendations None recommended by PT;Other (comment) (patient has walker at home)  Recommendations for Other Services       Functional Status Assessment Patient has had a recent decline in their functional status and demonstrates the ability to make significant improvements in function in a reasonable and  predictable amount of time.     Precautions / Restrictions Precautions Precautions: Fall Restrictions Weight Bearing Restrictions: No      Mobility  Bed Mobility Overal bed mobility: Modified Independent             General bed mobility comments: increased time, bed rails used    Transfers Overall transfer level: Needs assistance Equipment used: None Transfers: Sit to/from Stand Sit to Stand: Min assist                Ambulation/Gait Ambulation/Gait assistance: Min assist Gait Distance (Feet): 50 Feet Assistive device: Straight cane, Rolling walker (2 wheels) Gait Pattern/deviations: Step-through pattern, Decreased step length - right, Decreased step length - left, Trunk flexed, Decreased stride length Gait velocity: decreased     General Gait Details: initially ambulated with SPC and hand held assist 30 feet, then switched to RW and min guard. Patient safer with RW and encouraged patient to use RW for now. Husband and patient in agreement.  Stairs            Wheelchair Mobility    Modified Rankin (Stroke Patients Only)       Balance Overall balance assessment: Needs assistance, History of Falls Sitting-balance support: Feet supported Sitting balance-Leahy Scale: Good     Standing balance support: Bilateral upper extremity supported, During functional activity, Reliant on assistive device for balance Standing balance-Leahy Scale: Fair Standing balance comment: improved balance with B UE support                             Pertinent Vitals/Pain Pain Assessment Pain Assessment: No/denies pain    Home Living Family/patient expects to be discharged to:: Private residence Living Arrangements: Spouse/significant other Available Help  at Discharge: Family;Available 24 hours/day Type of Home: House Home Access: Stairs to enter   Entrance Stairs-Number of Steps: 5     Home Equipment: Agricultural consultant (2 wheels);Cane - single  point Additional Comments: husband is retired but still frequently works, sons live nearby    Prior Function Prior Level of Function : Independent/Modified Independent             Mobility Comments: MOD I using SPC, husband states she has had many falls and mostly furniture walks in the home, does not want to use RW ADLs Comments: mod I     Hand Dominance        Extremity/Trunk Assessment   Upper Extremity Assessment Upper Extremity Assessment: Defer to OT evaluation    Lower Extremity Assessment Lower Extremity Assessment: Generalized weakness    Cervical / Trunk Assessment Cervical / Trunk Assessment: Kyphotic  Communication   Communication: No difficulties  Cognition Arousal/Alertness: Awake/alert Behavior During Therapy: WFL for tasks assessed/performed Overall Cognitive Status: History of cognitive impairments - at baseline                                 General Comments: husband reports she has some memory difficulties        General Comments General comments (skin integrity, edema, etc.): SpO2 mid 90s on RA, decreased to 88% with trunk flexion for standing pericare, resolved with upright posture.    Exercises     Assessment/Plan    PT Assessment Patient needs continued PT services  PT Problem List Decreased strength;Decreased mobility;Decreased activity tolerance;Decreased balance;Decreased safety awareness;Decreased cognition;Decreased knowledge of use of DME       PT Treatment Interventions DME instruction;Therapeutic exercise;Gait training;Balance training;Functional mobility training;Therapeutic activities;Stair training;Patient/family education    PT Goals (Current goals can be found in the Care Plan section)  Acute Rehab PT Goals Patient Stated Goal: to return home, feel better PT Goal Formulation: With patient/family Time For Goal Achievement: 05/28/22 Potential to Achieve Goals: Good    Frequency Min 2X/week      Co-evaluation               AM-PAC PT "6 Clicks" Mobility  Outcome Measure Help needed turning from your back to your side while in a flat bed without using bedrails?: A Little Help needed moving from lying on your back to sitting on the side of a flat bed without using bedrails?: A Little Help needed moving to and from a bed to a chair (including a wheelchair)?: A Little Help needed standing up from a chair using your arms (e.g., wheelchair or bedside chair)?: A Little Help needed to walk in hospital room?: A Little Help needed climbing 3-5 steps with a railing? : A Little 6 Click Score: 18    End of Session Equipment Utilized During Treatment: Gait belt Activity Tolerance: Patient tolerated treatment well;Patient limited by fatigue Patient left: in bed;with call bell/phone within reach;with bed alarm set;with family/visitor present Nurse Communication: Mobility status PT Visit Diagnosis: Other abnormalities of gait and mobility (R26.89);Muscle weakness (generalized) (M62.81);Difficulty in walking, not elsewhere classified (R26.2);Unsteadiness on feet (R26.81)    Time: 5885-0277 PT Time Calculation (min) (ACUTE ONLY): 18 min   Charges:   PT Evaluation $PT Eval Moderate Complexity: 1 Mod PT Treatments $Gait Training: 8-22 mins        [employer], PT, GCS 05/21/22,3:01 PM

## 2022-05-22 DIAGNOSIS — E875 Hyperkalemia: Secondary | ICD-10-CM | POA: Diagnosis not present

## 2022-05-22 LAB — BASIC METABOLIC PANEL
Anion gap: 3 — ABNORMAL LOW (ref 5–15)
BUN: 46 mg/dL — ABNORMAL HIGH (ref 8–23)
CO2: 21 mmol/L — ABNORMAL LOW (ref 22–32)
Calcium: 8.1 mg/dL — ABNORMAL LOW (ref 8.9–10.3)
Chloride: 116 mmol/L — ABNORMAL HIGH (ref 98–111)
Creatinine, Ser: 1.4 mg/dL — ABNORMAL HIGH (ref 0.44–1.00)
GFR, Estimated: 38 mL/min — ABNORMAL LOW (ref >60)
Glucose, Bld: 74 mg/dL (ref 70–99)
Potassium: 4.7 mmol/L (ref 3.5–5.1)
Sodium: 140 mmol/L (ref 135–145)

## 2022-05-22 LAB — MAGNESIUM: Magnesium: 2 mg/dL (ref 1.7–2.4)

## 2022-05-22 NOTE — Discharge Summary (Signed)
Physician Discharge Summary  Amanda [name] BZJ:[id number] DOB: Aug 10, 1941 DOA: 05/20/2022  PCP: Enid Baas, MD  Admit date: 05/20/2022 Discharge date: 05/22/2022  Admitted From: Home Disposition: Home  Recommendations for Outpatient Follow-up:  Follow up with PCP in 1-2 weeks Please obtain BMP/CBC in one week   Home Health: PT/OT Equipment/Devices: None needed  Discharge Condition: Stable CODE STATUS: Full code Diet recommendation: Low-salt diet.  Discharge summary:  81 year old female with history of hypertension, depression, vitamin D deficiency, CKD stage IIIa with baseline creatinine about 1.2-1.3 brought to the ER with confusion that was episodic, potassium of 7 reported at primary care physician's office and sent to the ER.  In the emergency room patient was lethargic but otherwise hemodynamically stable.  Potassium was 6.5, BUN/creatinine 76/2.54.  Admitted with acute renal failure and hyperkalemia as well as metabolic encephalopathy.  Acute renal failure with history of CKD stage IIIa/hyperkalemia: Patient on losartan, triamterene hydrochlorothiazide, Coreg and amlodipine.  No EKG changes. Treated aggressively with IV fluids, Lokelma.  Repeat potassium normalized. Renal functions improved to her baseline levels. Abdominal imaging without evidence of hydronephrosis or retention. Due to significant hypokalemia, will discontinue further losartan. Blood pressures are stable without much intervention, resume beta-blockers and triamterene hydrochlorothiazide.  Discontinue amlodipine.  Acute metabolic encephalopathy: Suspected polypharmacy. Patient was tired and confused but without any neurological deficits. Urine drug screen was positive for TCA, opiates and benzodiazepines. Mental status improved and normalized after improvement of renal functions. Patient is on duloxetine and Cymbalta both, she is not on tricyclic's. Patient is on hydrocodone that she will continue.   Also reported that she had missing pills from her bottles. Patient does not have prescription for benzodiazepine, she was using Xanax in the past but last prescription is more than 3 months ago and patient tells me she has not used it for 3 years. More likely cumulative effects, excess medications or might have taken old prescriptions. Extensive education by provider and pharmacy about medication management, medication organization and bringing them to the doctor's office on follow-up. She will follow-up at pain management clinic.  Stable for discharge.  Discharge Diagnoses:  Principal Problem:   Hyperkalemia Active Problems:   Essential hypertension   Acute metabolic encephalopathy   Chronic back pain   Nephrolithiasis   Dehydration   Vitamin D deficiency   AKI (acute kidney injury) (HCC)   Elevated troponin   Mixed hyperlipidemia    Discharge Instructions  Discharge Instructions     Diet - low sodium heart healthy   Complete by: As directed    Discharge instructions   Complete by: As directed    Verify and only take your medications on this list  Try to use hydrocodone only when hurting   Increase activity slowly   Complete by: As directed       Allergies as of 05/22/2022       Reactions   Other Other (See Comments)   general anesthesia=difficulty waking   Codeine Nausea And Vomiting   Penicillins Hives   Has patient had a PCN reaction causing immediate rash, facial/tongue/throat swelling, SOB or lightheadedness with hypotension: Yes Has patient had a PCN reaction causing severe rash involving mucus membranes or skin necrosis: Unknown Has patient had a PCN reaction that required hospitalization: No Has patient had a PCN reaction occurring within the last 10 years: No If all of the above answers are "NO", then may proceed with Cephalosporin use.        Medication List  STOP taking these medications    amLODipine 5 MG tablet Commonly known as: NORVASC    lipase/protease/amylase 36000 UNITS Cpep capsule Commonly known as: CREON   oxybutynin 5 MG tablet Commonly known as: DITROPAN   pregabalin 25 MG capsule Commonly known as: Lyrica   valsartan 160 MG tablet Commonly known as: DIOVAN       TAKE these medications    B-12 PO Take 1 capsule by mouth daily.   carvedilol 6.25 MG tablet Commonly known as: COREG Take 6.25 mg by mouth daily.   cetirizine 10 MG tablet Commonly known as: ZYRTEC Take 10 mg by mouth daily.   Cholecalciferol 25 MCG (1000 UT) Chew Chew 1 tablet by mouth daily.   citalopram 10 MG tablet Commonly known as: CELEXA Take 10 mg by mouth daily.   colestipol 1 g tablet Commonly known as: COLESTID Take 2 tablets (2 g total) by mouth daily.   diclofenac Sodium 1 % Gel Commonly known as: VOLTAREN Apply topically 4 (four) times daily as needed.   DULoxetine 20 MG capsule Commonly known as: CYMBALTA Take 20 mg by mouth 2 (two) times daily.   gabapentin 300 MG capsule Commonly known as: NEURONTIN Take 300 mg by mouth 2 (two) times daily.   HYDROcodone-acetaminophen 5-325 MG tablet Commonly known as: NORCO/VICODIN Take 1 tablet by mouth every 12 (twelve) hours. Must last 30 days.   triamterene-hydrochlorothiazide 37.5-25 MG tablet Commonly known as: MAXZIDE-25 Take 1 tablet by mouth daily.        Allergies  Allergen Reactions   Other Other (See Comments)    general anesthesia=difficulty waking   Codeine Nausea And Vomiting   Penicillins Hives    Has patient had a PCN reaction causing immediate rash, facial/tongue/throat swelling, SOB or lightheadedness with hypotension: Yes Has patient had a PCN reaction causing severe rash involving mucus membranes or skin necrosis: Unknown Has patient had a PCN reaction that required hospitalization: No Has patient had a PCN reaction occurring within the last 10 years: No If all of the above answers are "NO", then may proceed with Cephalosporin use.      Consultations: None   Procedures/Studies: CT Renal Stone Study  Result Date: 05/20/2022 CLINICAL DATA:  A female at age 17 presents for evaluation of nephrolithiasis. EXAM: CT ABDOMEN AND PELVIS WITHOUT CONTRAST TECHNIQUE: Multidetector CT imaging of the abdomen and pelvis was performed following the standard protocol without IV contrast. RADIATION DOSE REDUCTION: This exam was performed according to the departmental dose-optimization program which includes automated exposure control, adjustment of the mA and/or kV according to patient size and/or use of iterative reconstruction technique. COMPARISON:  February 09, 2015. FINDINGS: Lower chest: Basilar atelectasis. No effusion. No consolidative changes. Mitral annular calcifications. No pericardial effusion. Heart is incompletely imaged. Hepatobiliary: No focal hepatic lesion to the extent evaluated on noncontrast imaging. No pericholecystic stranding. Mild sludge in the gallbladder. No gross biliary duct distension. Pancreas: Pancreatic atrophy without signs of inflammation. Spleen: Normal. Adrenals/Urinary Tract: 1.8 cm RIGHT adrenal lesion similar to previous imaging and with density value of 6 Hounsfield units compatible with adrenal adenoma for which no dedicated additional imaging follow-up is recommended. Nephrolithiasis of the bilateral kidneys largest in the lower pole of the RIGHT kidney measuring 10 mm (image 30/2) No ureteral calculi. No hydronephrosis. No perinephric stranding. Smooth renal contours. Urinary bladder with smooth contours, under distended without perivesical stranding. Limited assessment of pelvic structures due to streak artifact from hip arthroplasties bilaterally. Stomach/Bowel: Partial gastric resection and hiatal  hernia repair since prior imaging. No stranding adjacent to the stomach. No sign of small bowel dilation. Normal appendix. Colonic diverticulosis without diverticulitis, moderate to marked diverticular changes of the  sigmoid slightly increased from previous imaging. Vascular/Lymphatic: Aortic atherosclerosis. No sign of aneurysm. Smooth contour of the IVC. There is no gastrohepatic or hepatoduodenal ligament lymphadenopathy. No retroperitoneal or mesenteric lymphadenopathy. No pelvic sidewall lymphadenopathy. Limited assessment due to lack of intravenous contrast with respect to vascular structures. Reproductive: Post hysterectomy. Other: No ascites or free air. Musculoskeletal: Spinal degenerative changes. No acute or destructive bone process. Bilateral hip arthroplasty changes. Moderate to marked spinal canal narrowing at the L2-3 level due to facet arthropathy and discogenic changes and there is mild anterolisthesis of L2 on L3 that is increased compared to more remote imaging from 2016 but is similar compared to MRI imaging from March 09, 2019. Post laminectomy at the L4-5 level. IMPRESSION: 1. Nephrolithiasis of the bilateral kidneys largest in the lower pole of the RIGHT kidney measuring 10 mm. No ureteral calculi or hydronephrosis. 2. Partial gastric resection and hiatal hernia repair since prior imaging. 3. Moderate to marked spinal canal narrowing at L2-3 due to facet arthropathy and discogenic changes similar to previous MR imaging. Aortic Atherosclerosis (ICD10-I70.0). Electronically Signed   By: Donzetta Kohut M.D.   On: 05/20/2022 22:33   US Renal  Result Date: 05/20/2022 CLINICAL DATA:  Acute kidney injury EXAM: RENAL / URINARY TRACT ULTRASOUND COMPLETE COMPARISON:  Radiograph 05/06/2021 FINDINGS: Right Kidney: Renal measurements: 8.4 x 3.9 x 3.8 cm = volume: 63 mL. Echogenicity normal. No mass or hydronephrosis. Possible stone at the lower pole measuring 9 mm. Left Kidney: Renal measurements: 8.6 x 3.9 x 3.8 cm = volume: 65 mL. Echogenicity within normal limits. No mass or hydronephrosis visualized. Bladder: Appears normal for degree of bladder distention. Other: None. IMPRESSION: Possible right kidney stone.  Otherwise negative ultrasound appearance of the kidneys Electronically Signed   By: Jasmine Pang M.D.   On: 05/20/2022 20:04   CT Head Wo Contrast  Result Date: 05/20/2022 CLINICAL DATA:  Mental status change EXAM: CT HEAD WITHOUT CONTRAST TECHNIQUE: Contiguous axial images were obtained from the base of the skull through the vertex without intravenous contrast. RADIATION DOSE REDUCTION: This exam was performed according to the departmental dose-optimization program which includes automated exposure control, adjustment of the mA and/or kV according to patient size and/or use of iterative reconstruction technique. COMPARISON:  CT 01/09/2022 FINDINGS: Brain: No acute territorial infarction, hemorrhage, or intracranial mass. Mild atrophy. Mild chronic small vessel ischemic changes of the white matter. 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 Stable ventricle size Vascular: No hyperdense vessels.  Carotid vascular calcification Skull: Normal. Negative for fracture or focal lesion. Sinuses/Orbits: No acute finding. Other: None IMPRESSION: 1. No CT evidence for acute intracranial abnormality. 2. Atrophy and chronic small vessel ischemic changes of the white matter Electronically Signed   By: Jasmine Pang M.D.   On: 05/20/2022 17:39   (Echo, Carotid, EGD, Colonoscopy, ERCP)    Subjective: Patient seen and examined.  Today she is without any complaints. Patient complains about stressful life, recent stressful events and probably mixed up medications.  Today without any complaints.   Discharge Exam: Vitals:   05/22/22 0356 05/22/22 0753  BP: (!) 119/92 134/76  Pulse: 62 63  Resp: 18 16  Temp: 97.8 F (36.6 C) 97.7 F (36.5 C)  SpO2: 98% 95%   Vitals:   05/21/22 1958 05/21/22 2138 05/22/22 0356 05/22/22  0753  BP: (!) 95/57 109/66 (!) 119/92 134/76  Pulse: (!) 58 (!) 58 62 63  Resp: 16  18 16   Temp: 97.9 F (36.6 C)  97.8 F (36.6 C) 97.7 F (36.5 C)  TempSrc:   Oral   SpO2: 95% 95%  98% 95%  Height:        General: Pt is alert, awake, not in acute distress Alert and awake.  Oriented.  In normal mood. Cardiovascular: RRR, S1/S2 +, no rubs, no gallops Respiratory: CTA bilaterally, no wheezing, no rhonchi Abdominal: Soft, NT, ND, bowel sounds + Extremities: no edema, no cyanosis    The results of significant diagnostics from this hospitalization (including imaging, microbiology, ancillary and laboratory) are listed below for reference.     Microbiology: No results found for this or any previous visit (from the past 240 hour(s)).   Labs: BNP (last 3 results) No results for input(s): "BNP" in the last 8760 hours. Basic Metabolic Panel: Recent Labs  Lab 05/20/22 1658 05/21/22 0813 05/22/22 0457  NA 139 138 140  K 6.3* 5.5* 4.7  CL 112* 114* 116*  CO2 18* 21* 21*  GLUCOSE 105* 92 74  BUN 76* 59* 46*  CREATININE 2.54* 1.65* 1.40*  CALCIUM 8.9 8.2* 8.1*  MG  --  2.4 2.0  PHOS  --  3.9  --    Liver Function Tests: Recent Labs  Lab 05/20/22 1658 05/21/22 0813  AST 18 16  ALT 12 10  ALKPHOS 63 43  BILITOT 0.8 0.8  PROT 6.9 6.0*  ALBUMIN 4.0 3.4*   Recent Labs  Lab 05/20/22 1658  LIPASE 47   Recent Labs  Lab 05/20/22 2342  AMMONIA 20   CBC: Recent Labs  Lab 05/20/22 1658  WBC 4.7  NEUTROABS 2.8  HGB 12.1  HCT 37.8  MCV 88.7  PLT 183   Cardiac Enzymes: No results for input(s): "CKTOTAL", "CKMB", "CKMBINDEX", "TROPONINI" in the last 168 hours. BNP: Invalid input(s): "POCBNP" CBG: No results for input(s): "GLUCAP" in the last 168 hours. D-Dimer No results for input(s): "DDIMER" in the last 72 hours. Hgb A1c No results for input(s): "HGBA1C" in the last 72 hours. Lipid Profile No results for input(s): "CHOL", "HDL", "LDLCALC", "TRIG", "CHOLHDL", "LDLDIRECT" in the last 72 hours. Thyroid function studies No results for input(s): "TSH", "T4TOTAL", "T3FREE", "THYROIDAB" in the last 72 hours.  Invalid input(s): "FREET3" Anemia  work up No results for input(s): "VITAMINB12", "FOLATE", "FERRITIN", "TIBC", "IRON", "RETICCTPCT" in the last 72 hours. Urinalysis    Component Value Date/Time   COLORURINE YELLOW (A) 01/09/2022 1248   APPEARANCEUR CLEAR (A) 01/09/2022 1248   APPEARANCEUR Clear 02/09/2015 1254   LABSPEC 1.010 01/09/2022 1248   LABSPEC 1.011 02/09/2015 1254   PHURINE 5.0 01/09/2022 1248   GLUCOSEU NEGATIVE 01/09/2022 1248   GLUCOSEU Negative 02/09/2015 1254   HGBUR NEGATIVE 01/09/2022 1248   BILIRUBINUR NEGATIVE 01/09/2022 1248   BILIRUBINUR Negative 02/09/2015 1254   KETONESUR NEGATIVE 01/09/2022 1248   PROTEINUR NEGATIVE 01/09/2022 1248   NITRITE NEGATIVE 01/09/2022 1248   LEUKOCYTESUR NEGATIVE 01/09/2022 1248   LEUKOCYTESUR Trace 02/09/2015 1254   Sepsis Labs Recent Labs  Lab 05/20/22 1658  WBC 4.7   Microbiology No results found for this or any previous visit (from the past 240 hour(s)).   Time coordinating discharge:  35 minutes  SIGNED:   Barb Merino, MD  [facility] 05/22/2022, 4:11 PM

## 2022-05-22 NOTE — Care Management (Addendum)
RNCM reached out to care team re: need to be seen by case management prior to discharge, however patient was discharged prior to case management assessment  Home health set  Home health set up with [facility] Unable to reach patient or spouse via phone after multiple attempts

## 2022-05-22 NOTE — Progress Notes (Signed)
Discharge instructions reviewed with patient and husband at the bedside. Education provided on the importance of stopping correct medications. Stopped medications were high lighted and medications to be continued were listed as AM and PM medications for patients pill organizer at home. Questions and concerns were addressed and answered for both wife and husband. PIV removed with no complications. Patient Dcing home with husband via car.

## 2022-05-24 ENCOUNTER — Other Ambulatory Visit: Payer: Self-pay | Admitting: Student in an Organized Health Care Education/Training Program

## 2022-05-24 DIAGNOSIS — I10 Essential (primary) hypertension: Secondary | ICD-10-CM | POA: Diagnosis not present

## 2022-05-24 DIAGNOSIS — E875 Hyperkalemia: Secondary | ICD-10-CM | POA: Diagnosis not present

## 2022-05-28 DIAGNOSIS — I10 Essential (primary) hypertension: Secondary | ICD-10-CM | POA: Diagnosis not present

## 2022-05-28 DIAGNOSIS — E875 Hyperkalemia: Secondary | ICD-10-CM | POA: Diagnosis not present

## 2022-05-29 ENCOUNTER — Encounter: Payer: Self-pay | Admitting: Student in an Organized Health Care Education/Training Program

## 2022-05-29 ENCOUNTER — Ambulatory Visit
Admission: RE | Admit: 2022-05-29 | Discharge: 2022-05-29 | Disposition: A | Discharge status: Home / Self Care | Payer: Medicare Other | Source: Ambulatory Visit | Attending: Internal Medicine | Admitting: Internal Medicine

## 2022-05-29 ENCOUNTER — Other Ambulatory Visit (HOSPITAL_COMMUNITY): Payer: Self-pay | Admitting: Internal Medicine

## 2022-05-29 ENCOUNTER — Ambulatory Visit (HOSPITAL_BASED_OUTPATIENT_CLINIC_OR_DEPARTMENT_OTHER): Payer: Medicare Other | Admitting: Student in an Organized Health Care Education/Training Program

## 2022-05-29 ENCOUNTER — Other Ambulatory Visit: Payer: Self-pay | Admitting: Internal Medicine

## 2022-05-29 VITALS — BP 180/98 | HR 69 | Temp 97.3°F | Resp 16 | Ht 62.0 in | Wt 160.0 lb

## 2022-05-29 DIAGNOSIS — M25511 Pain in right shoulder: Secondary | ICD-10-CM | POA: Insufficient documentation

## 2022-05-29 DIAGNOSIS — M5416 Radiculopathy, lumbar region: Secondary | ICD-10-CM | POA: Diagnosis not present

## 2022-05-29 DIAGNOSIS — W19XXXA Unspecified fall, initial encounter: Secondary | ICD-10-CM | POA: Insufficient documentation

## 2022-05-29 DIAGNOSIS — M961 Postlaminectomy syndrome, not elsewhere classified: Secondary | ICD-10-CM

## 2022-05-29 DIAGNOSIS — I10 Essential (primary) hypertension: Secondary | ICD-10-CM | POA: Diagnosis not present

## 2022-05-29 DIAGNOSIS — R42 Dizziness and giddiness: Secondary | ICD-10-CM | POA: Insufficient documentation

## 2022-05-29 DIAGNOSIS — G894 Chronic pain syndrome: Secondary | ICD-10-CM | POA: Insufficient documentation

## 2022-05-29 DIAGNOSIS — I6782 Cerebral ischemia: Secondary | ICD-10-CM | POA: Insufficient documentation

## 2022-05-29 DIAGNOSIS — R519 Headache, unspecified: Secondary | ICD-10-CM | POA: Insufficient documentation

## 2022-05-29 DIAGNOSIS — M25512 Pain in left shoulder: Secondary | ICD-10-CM | POA: Insufficient documentation

## 2022-05-29 DIAGNOSIS — G8929 Other chronic pain: Secondary | ICD-10-CM

## 2022-05-29 DIAGNOSIS — M48062 Spinal stenosis, lumbar region with neurogenic claudication: Secondary | ICD-10-CM

## 2022-05-29 DIAGNOSIS — E875 Hyperkalemia: Secondary | ICD-10-CM | POA: Diagnosis not present

## 2022-05-29 DIAGNOSIS — W010XXA Fall on same level from slipping, tripping and stumbling without subsequent striking against object, initial encounter: Secondary | ICD-10-CM | POA: Diagnosis not present

## 2022-05-29 MED ORDER — HYDROCODONE-ACETAMINOPHEN 5-325 MG PO TABS: 1.0000 | ORAL_TABLET | Freq: Every day | ORAL | 0 refills | Status: AC | PRN

## 2022-05-29 MED ORDER — GABAPENTIN 300 MG PO CAPS: 300.0000 mg | ORAL_CAPSULE | Freq: Two times a day (BID) | ORAL | 2 refills | Status: DC

## 2022-05-29 NOTE — Addendum Note (Signed)
Addended by: Edward Jolly on: 05/29/2022 09:22 AM   Modules accepted: Orders

## 2022-05-29 NOTE — Progress Notes (Signed)
Nursing Pain Medication Assessment:  Safety precautions to be maintained throughout the outpatient stay will include: orient to surroundings, keep bed in low position, maintain call bell within reach at all times, provide assistance with transfer out of bed and ambulation.  Medication Inspection Compliance: Amanda [name] did not comply with our request to bring her pills to be counted. She was reminded that bringing the medication bottles, even when empty, is a requirement.  Medication: None brought in. Pill/Patch Count: None available to be counted. Bottle Appearance: No container available. Did not bring bottle(s) to appointment. Filled Date: N/A Last Medication intake:   pt. unsure of last dose

## 2022-05-29 NOTE — Progress Notes (Addendum)
PROVIDER NOTE: Information contained herein reflects review and annotations entered in association with encounter. Interpretation of such information and data should be left to medically-trained personnel. Information provided to patient can be located elsewhere in the medical record under "Patient Instructions". Document created using STT-dictation technology, any transcriptional errors that may result from process are unintentional.    [name]  Service Category: E/M  Provider: Gillis Santa, MD  DOB: 1941-06-28  DOS: 05/29/2022  Specialty: Interventional Pain Management  MRN: [id number]  Setting: Ambulatory outpatient  PCP: Gladstone Lighter, MD  Type: Established Patient    Referring Provider: Gladstone Lighter, MD  Location: Office  Delivery: Face-to-face     HPI  Ms. Amanda [name], a 81 y.o. year old female, is here today because of her Failed back surgical syndrome [M96.1]. Ms. Amanda [name] primary complain today is Shoulder Pain, Back Pain, and Leg Pain Last encounter: My last encounter with her was on 05/24/2022.  Pain Assessment: Severity of Chronic pain is reported as a 7 /10. Location: Shoulder  /to back down both legs to feet. Onset: More than a month ago. Quality: Other (Comment) (agonizing). Timing: Constant. Modifying factor(s): meds. Vitals:  height is '5\' 2"'  (1.575 m) and weight is 160 lb (72.6 kg). Her temporal temperature is 97.3 F (36.3 C) (abnormal). Her blood pressure is 180/98 (abnormal) and her pulse is 69. Her respiration is 16 and oxygen saturation is 99%.   Reason for encounter:   Amanda [name] presents today for medication management.  She complains of diffuse whole body pain most pronounced in her shoulder, low back, bilateral legs.  Of note, there was disagreement between her and her husband about her hydrocodone.  She was hospitalized for 4 days for hyperkalemia.  Furthermore there is concern for anxiety attack, panic, trembling documented in primary care note, this  may have been related to overtaking her medications.  She received a quantity of 60 hydrocodone on 7/3 but on 7/11 she only had a quantity of 2 left.  She has no tablets today.  There is concern of a family member perhaps stealing them.    Amanda [name] was insistent on allowing her to continue with pain management here.  Our nurse manager, Jocelyn Lamer knees had an extensive discussion with her, in the absence of her husband to confirm safety.  Safety was confirmed however they are having some internal family issues.  We discussed strategies of how she could manage her own medications and we recommended they get a pillbox and a lock box.  I informed Amanda [name] that this is her only warning and that should she have a pill count discrepancy in the future, I will be violating her pain contract given safety risk.  Patient endorsed understanding.  Of note, I took on the patient for medication management at her last visit which was on 5/31/ 2023.  I have limited interaction with her since this is only my third visit.  I am unclear of what her cognitive baseline is.  Pain management contract will be violated if this happens again.   ROS  Constitutional: Denies any fever or chills Gastrointestinal: No reported hemesis, hematochezia, vomiting, or acute GI distress Musculoskeletal:  Diffuse musculoskeletal pain as above Neurological: No reported episodes of acute onset apraxia, aphasia, dysarthria, agnosia, amnesia, paralysis, loss of coordination, or loss of consciousness  Medication Review  Cholecalciferol, Cyanocobalamin, DULoxetine, HYDROcodone-acetaminophen, carvedilol, cetirizine, citalopram, colestipol, diclofenac Sodium, gabapentin, and triamterene-hydrochlorothiazide  History Review  Allergy: Ms. Amanda [name] is allergic to other, codeine, and penicillins. Drug:  Ms. Amanda [name]  reports no history of drug use. Alcohol:  reports no history of alcohol use. Tobacco:  reports that she has quit smoking. Her smoking use included  cigarettes. She has a 30.00 pack-year smoking history. She has never used smokeless tobacco. Social: Ms. Amanda [name]  reports that she has quit smoking. Her smoking use included cigarettes. She has a 30.00 pack-year smoking history. She has never used smokeless tobacco. She reports that she does not drink alcohol and does not use drugs. Medical:  has a past medical history of Anxiety, Asthma, Complication of anesthesia, GI bleed, Hiatal hernia, Hypertension, and IDA (iron deficiency anemia). Surgical: Ms. Keathley  has a past surgical history that includes Colonoscopy (04/28/2012); Breast biopsy (Left, 01/17/2002); Upper gi endoscopy (04/28/2012); Colonoscopy (02-12-15); Esophageal manometry (N/A, 06/13/2015); Esophagus surgery (Oct 2016); Paraesophageal hernia repair (07-26-15); Parathyroidectomy (Left); Abdominal hysterectomy; Knee arthroscopy (Left); collapsed  lung (Left); Bilateral carpal tunnel release; Total hip arthroplasty (Left, 05/12/2017); Back surgery; and Total hip arthroplasty (Right, 03/04/2018). Family: family history is not on file.  Laboratory Chemistry Profile   Renal Lab Results  Component Value Date   BUN 46 (H) 05/22/2022   CREATININE 1.40 (H) 05/22/2022   GFRAA 47 (L) 07/03/2019   GFRNONAA 38 (L) 05/22/2022    Hepatic Lab Results  Component Value Date   AST 16 05/21/2022   ALT 10 05/21/2022   ALBUMIN 3.4 (L) 05/21/2022   ALKPHOS 43 05/21/2022   LIPASE 47 05/20/2022   AMMONIA 20 05/20/2022    Electrolytes Lab Results  Component Value Date   NA 140 05/22/2022   K 4.7 05/22/2022   CL 116 (H) 05/22/2022   CALCIUM 8.1 (L) 05/22/2022   MG 2.0 05/22/2022   PHOS 3.9 05/21/2022    Bone No results found for: "VD25OH", "VD125OH2TOT", "BC4888BV6", "XI5038UE2", "25OHVITD1", "25OHVITD2", "25OHVITD3", "TESTOFREE", "TESTOSTERONE"  Inflammation (CRP: Acute Phase) (ESR: Chronic Phase) Lab Results  Component Value Date   ESRSEDRATE 54 (H) 02/18/2018         Note: Above Lab results  reviewed.  Recent Imaging Review  CT Renal Stone Study CLINICAL DATA:  A female at age 48 presents for evaluation of nephrolithiasis.  EXAM: CT ABDOMEN AND PELVIS WITHOUT CONTRAST  TECHNIQUE: Multidetector CT imaging of the abdomen and pelvis was performed following the standard protocol without IV contrast.  RADIATION DOSE REDUCTION: This exam was performed according to the departmental dose-optimization program which includes automated exposure control, adjustment of the mA and/or kV according to patient size and/or use of iterative reconstruction technique.  COMPARISON:  February 09, 2015.  FINDINGS: Lower chest: Basilar atelectasis. No effusion. No consolidative changes. Mitral annular calcifications. No pericardial effusion. Heart is incompletely imaged.  Hepatobiliary: No focal hepatic lesion to the extent evaluated on noncontrast imaging. No pericholecystic stranding. Mild sludge in the gallbladder. No gross biliary duct distension.  Pancreas: Pancreatic atrophy without signs of inflammation.  Spleen: Normal.  Adrenals/Urinary Tract: 1.8 cm RIGHT adrenal lesion similar to previous imaging and with density value of 6 Hounsfield units compatible with adrenal adenoma for which no dedicated additional imaging follow-up is recommended.  Nephrolithiasis of the bilateral kidneys largest in the lower pole of the RIGHT kidney measuring 10 mm (image 30/2)  No ureteral calculi. No hydronephrosis. No perinephric stranding. Smooth renal contours. Urinary bladder with smooth contours, under distended without perivesical stranding. Limited assessment of pelvic structures due to streak artifact from hip arthroplasties bilaterally.  Stomach/Bowel: Partial gastric resection and hiatal hernia repair since prior imaging. No stranding adjacent  to the stomach. No sign of small bowel dilation. Normal appendix. Colonic diverticulosis without diverticulitis, moderate to marked  diverticular changes of the sigmoid slightly increased from previous imaging.  Vascular/Lymphatic:  Aortic atherosclerosis. No sign of aneurysm. Smooth contour of the IVC. There is no gastrohepatic or hepatoduodenal ligament lymphadenopathy. No retroperitoneal or mesenteric lymphadenopathy.  No pelvic sidewall lymphadenopathy.  Limited assessment due to lack of intravenous contrast with respect to vascular structures.  Reproductive: Post hysterectomy.  Other: No ascites or free air.  Musculoskeletal: Spinal degenerative changes. No acute or destructive bone process. Bilateral hip arthroplasty changes.  Moderate to marked spinal canal narrowing at the L2-3 level due to facet arthropathy and discogenic changes and there is mild anterolisthesis of L2 on L3 that is increased compared to more remote imaging from 2016 but is similar compared to MRI imaging from March 09, 2019. Post laminectomy at the L4-5 level.  IMPRESSION: 1. Nephrolithiasis of the bilateral kidneys largest in the lower pole of the RIGHT kidney measuring 10 mm. No ureteral calculi or hydronephrosis. 2. Partial gastric resection and hiatal hernia repair since prior imaging. 3. Moderate to marked spinal canal narrowing at L2-3 due to facet arthropathy and discogenic changes similar to previous MR imaging.  Aortic Atherosclerosis (ICD10-I70.0).  Electronically Signed   By: Zetta Bills M.D.   On: 05/20/2022 22:33 US Renal CLINICAL DATA:  Acute kidney injury  EXAM: RENAL / URINARY TRACT ULTRASOUND COMPLETE  COMPARISON:  Radiograph 05/06/2021  FINDINGS: Right Kidney:  Renal measurements: 8.4 x 3.9 x 3.8 cm = volume: 63 mL. Echogenicity normal. No mass or hydronephrosis. Possible stone at the lower pole measuring 9 mm.  Left Kidney:  Renal measurements: 8.6 x 3.9 x 3.8 cm = volume: 65 mL. Echogenicity within normal limits. No mass or hydronephrosis visualized.  Bladder:  Appears normal for  degree of bladder distention.  Other:  None.  IMPRESSION: Possible right kidney stone. Otherwise negative ultrasound appearance of the kidneys  Electronically Signed   By: Donavan Foil M.D.   On: 05/20/2022 20:04 CT Head Wo Contrast CLINICAL DATA:  Mental status change  EXAM: CT HEAD WITHOUT CONTRAST  TECHNIQUE: Contiguous axial images were obtained from the base of the skull through the vertex without intravenous contrast.  RADIATION DOSE REDUCTION: This exam was performed according to the departmental dose-optimization program which includes automated exposure control, adjustment of the mA and/or kV according to patient size and/or use of iterative reconstruction technique.  COMPARISON:  CT 01/09/2022  FINDINGS: Brain: No acute territorial infarction, hemorrhage, or intracranial mass. Mild atrophy. Mild chronic small vessel ischemic changes of the white matter. 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 Stable ventricle size  Vascular: No hyperdense vessels.  Carotid vascular calcification  Skull: Normal. Negative for fracture or focal lesion.  Sinuses/Orbits: No acute finding.  Other: None  IMPRESSION: 1. No CT evidence for acute intracranial abnormality. 2. Atrophy and chronic small vessel ischemic changes of the white matter  Electronically Signed   By: Donavan Foil M.D.   On: 05/20/2022 17:39 Note: Reviewed        Physical Exam  General appearance: Well nourished, well developed, and well hydrated. In no apparent acute distress Mental status: Alert, oriented x 3 (person, place, & time)       Respiratory: No evidence of acute respiratory distress Eyes: PERLA Vitals: BP (!) 180/98   Pulse 69   Temp (!) 97.3 F (36.3 C) (Temporal)   Resp 16   Ht 5'  2" (1.575 m)   Wt 160 lb (72.6 kg)   SpO2 99%   BMI 29.26 kg/m  BMI: Estimated body mass index is 29.26 kg/m as calculated from the following:   Height as of this encounter: '5\' 2"'  (1.575  m).   Weight as of this encounter: 160 lb (72.6 kg). Ideal: Ideal body weight: 50.1 kg (110 lb 7.2 oz) Adjusted ideal body weight: 59.1 kg (130 lb 4.3 oz)  Bruising of right eye  Assessment   Diagnosis  1. Failed back surgical syndrome   2. Spinal stenosis of lumbar region with neurogenic claudication   3. Chronic pain of both shoulders   4. Chronic pain syndrome       Plan of Care   Ms. Amanda [name] has a current medication list which includes the following long-term medication(s): carvedilol, cetirizine, citalopram, colestipol, duloxetine, gabapentin, and triamterene-hydrochlorothiazide.  Pharmacotherapy (Medications Ordered): Meds ordered this encounter  Medications   gabapentin (NEURONTIN) 300 MG capsule    Sig: Take 1 capsule (300 mg total) by mouth 2 (two) times daily.    Dispense:  60 capsule    Refill:  2   HYDROcodone-acetaminophen (NORCO/VICODIN) 5-325 MG tablet    Sig: Take 1 tablet by mouth daily as needed for moderate pain.    Dispense:  30 tablet    Refill:  0   Orders:  No orders of the defined types were placed in this encounter.  Follow-up plan:   Return in 4 weeks (on 06/26/2022) for Medication Management, in person.    Recent Visits Date Type Provider Dept  04/09/22 Office Visit Gillis Santa, MD [facility]  03/18/22 Office Visit Gillis Santa, MD [facility]  Showing recent visits within past 90 days and meeting all other requirements Today's Visits Date Type Provider Dept  05/29/22 Office Visit Gillis Santa, MD [facility]  Showing today's visits and meeting all other requirements Future Appointments No visits were found meeting these conditions. Showing future appointments within next 90 days and meeting all other requirements  I discussed the assessment and treatment plan with the patient. The patient was provided an opportunity to ask questions and all were answered. The patient agreed with the plan and  demonstrated an understanding of the instructions.  Patient advised to call back or seek an in-person evaluation if the symptoms or condition worsens.  Duration of encounter: 18mnutes.  Total time on encounter, as per AMA guidelines included both the face-to-face and non-face-to-face time personally spent by the physician and/or other qualified health care professional(s) on the day of the encounter (includes time in activities that require the physician or other qualified health care professional and does not include time in activities normally performed by clinical staff). Physician's time may include the following activities when performed: preparing to see the patient (eg, review of tests, pre-charting review of records) obtaining and/or reviewing separately obtained history performing a medically appropriate examination and/or evaluation counseling and educating the patient/family/caregiver ordering medications, tests, or procedures referring and communicating with other health care professionals (when not separately reported) documenting clinical information in the electronic or other health record independently interpreting results (not separately reported) and communicating results to the patient/ family/caregiver care coordination (not separately reported)  Note by: BGillis Santa MD Date: 05/29/2022; Time: 9:22 AM

## 2022-05-30 DIAGNOSIS — E559 Vitamin D deficiency, unspecified: Secondary | ICD-10-CM | POA: Diagnosis not present

## 2022-05-30 DIAGNOSIS — N200 Calculus of kidney: Secondary | ICD-10-CM | POA: Diagnosis not present

## 2022-05-30 DIAGNOSIS — G8929 Other chronic pain: Secondary | ICD-10-CM | POA: Diagnosis not present

## 2022-05-30 DIAGNOSIS — E875 Hyperkalemia: Secondary | ICD-10-CM | POA: Diagnosis not present

## 2022-05-30 DIAGNOSIS — Z79891 Long term (current) use of opiate analgesic: Secondary | ICD-10-CM | POA: Diagnosis not present

## 2022-05-30 DIAGNOSIS — E785 Hyperlipidemia, unspecified: Secondary | ICD-10-CM | POA: Diagnosis not present

## 2022-05-30 DIAGNOSIS — F32A Depression, unspecified: Secondary | ICD-10-CM | POA: Diagnosis not present

## 2022-05-30 DIAGNOSIS — K449 Diaphragmatic hernia without obstruction or gangrene: Secondary | ICD-10-CM | POA: Diagnosis not present

## 2022-05-30 DIAGNOSIS — M549 Dorsalgia, unspecified: Secondary | ICD-10-CM | POA: Diagnosis not present

## 2022-05-30 DIAGNOSIS — J45909 Unspecified asthma, uncomplicated: Secondary | ICD-10-CM | POA: Diagnosis not present

## 2022-05-30 DIAGNOSIS — I129 Hypertensive chronic kidney disease with stage 1 through stage 4 chronic kidney disease, or unspecified chronic kidney disease: Secondary | ICD-10-CM | POA: Diagnosis not present

## 2022-05-30 DIAGNOSIS — Z9181 History of falling: Secondary | ICD-10-CM | POA: Diagnosis not present

## 2022-05-30 DIAGNOSIS — Z87891 Personal history of nicotine dependence: Secondary | ICD-10-CM | POA: Diagnosis not present

## 2022-05-30 DIAGNOSIS — N1831 Chronic kidney disease, stage 3a: Secondary | ICD-10-CM | POA: Diagnosis not present

## 2022-05-30 DIAGNOSIS — D509 Iron deficiency anemia, unspecified: Secondary | ICD-10-CM | POA: Diagnosis not present

## 2022-06-02 DIAGNOSIS — N200 Calculus of kidney: Secondary | ICD-10-CM | POA: Diagnosis not present

## 2022-06-02 DIAGNOSIS — E785 Hyperlipidemia, unspecified: Secondary | ICD-10-CM | POA: Diagnosis not present

## 2022-06-02 DIAGNOSIS — J45909 Unspecified asthma, uncomplicated: Secondary | ICD-10-CM | POA: Diagnosis not present

## 2022-06-02 DIAGNOSIS — Z79891 Long term (current) use of opiate analgesic: Secondary | ICD-10-CM | POA: Diagnosis not present

## 2022-06-02 DIAGNOSIS — E875 Hyperkalemia: Secondary | ICD-10-CM | POA: Diagnosis not present

## 2022-06-02 DIAGNOSIS — K449 Diaphragmatic hernia without obstruction or gangrene: Secondary | ICD-10-CM | POA: Diagnosis not present

## 2022-06-02 DIAGNOSIS — E559 Vitamin D deficiency, unspecified: Secondary | ICD-10-CM | POA: Diagnosis not present

## 2022-06-02 DIAGNOSIS — M549 Dorsalgia, unspecified: Secondary | ICD-10-CM | POA: Diagnosis not present

## 2022-06-02 DIAGNOSIS — F32A Depression, unspecified: Secondary | ICD-10-CM | POA: Diagnosis not present

## 2022-06-02 DIAGNOSIS — D509 Iron deficiency anemia, unspecified: Secondary | ICD-10-CM | POA: Diagnosis not present

## 2022-06-02 DIAGNOSIS — Z9181 History of falling: Secondary | ICD-10-CM | POA: Diagnosis not present

## 2022-06-02 DIAGNOSIS — G8929 Other chronic pain: Secondary | ICD-10-CM | POA: Diagnosis not present

## 2022-06-02 DIAGNOSIS — I129 Hypertensive chronic kidney disease with stage 1 through stage 4 chronic kidney disease, or unspecified chronic kidney disease: Secondary | ICD-10-CM | POA: Diagnosis not present

## 2022-06-02 DIAGNOSIS — N1831 Chronic kidney disease, stage 3a: Secondary | ICD-10-CM | POA: Diagnosis not present

## 2022-06-02 DIAGNOSIS — Z87891 Personal history of nicotine dependence: Secondary | ICD-10-CM | POA: Diagnosis not present

## 2022-06-05 DIAGNOSIS — Z9181 History of falling: Secondary | ICD-10-CM | POA: Diagnosis not present

## 2022-06-05 DIAGNOSIS — M549 Dorsalgia, unspecified: Secondary | ICD-10-CM | POA: Diagnosis not present

## 2022-06-05 DIAGNOSIS — N1831 Chronic kidney disease, stage 3a: Secondary | ICD-10-CM | POA: Diagnosis not present

## 2022-06-05 DIAGNOSIS — E875 Hyperkalemia: Secondary | ICD-10-CM | POA: Diagnosis not present

## 2022-06-05 DIAGNOSIS — E559 Vitamin D deficiency, unspecified: Secondary | ICD-10-CM | POA: Diagnosis not present

## 2022-06-05 DIAGNOSIS — J45909 Unspecified asthma, uncomplicated: Secondary | ICD-10-CM | POA: Diagnosis not present

## 2022-06-05 DIAGNOSIS — N200 Calculus of kidney: Secondary | ICD-10-CM | POA: Diagnosis not present

## 2022-06-05 DIAGNOSIS — Z79891 Long term (current) use of opiate analgesic: Secondary | ICD-10-CM | POA: Diagnosis not present

## 2022-06-05 DIAGNOSIS — I129 Hypertensive chronic kidney disease with stage 1 through stage 4 chronic kidney disease, or unspecified chronic kidney disease: Secondary | ICD-10-CM | POA: Diagnosis not present

## 2022-06-05 DIAGNOSIS — K449 Diaphragmatic hernia without obstruction or gangrene: Secondary | ICD-10-CM | POA: Diagnosis not present

## 2022-06-05 DIAGNOSIS — Z87891 Personal history of nicotine dependence: Secondary | ICD-10-CM | POA: Diagnosis not present

## 2022-06-05 DIAGNOSIS — D509 Iron deficiency anemia, unspecified: Secondary | ICD-10-CM | POA: Diagnosis not present

## 2022-06-05 DIAGNOSIS — E785 Hyperlipidemia, unspecified: Secondary | ICD-10-CM | POA: Diagnosis not present

## 2022-06-05 DIAGNOSIS — F32A Depression, unspecified: Secondary | ICD-10-CM | POA: Diagnosis not present

## 2022-06-05 DIAGNOSIS — G8929 Other chronic pain: Secondary | ICD-10-CM | POA: Diagnosis not present

## 2022-06-09 DIAGNOSIS — Z9181 History of falling: Secondary | ICD-10-CM | POA: Diagnosis not present

## 2022-06-09 DIAGNOSIS — N200 Calculus of kidney: Secondary | ICD-10-CM | POA: Diagnosis not present

## 2022-06-09 DIAGNOSIS — K449 Diaphragmatic hernia without obstruction or gangrene: Secondary | ICD-10-CM | POA: Diagnosis not present

## 2022-06-09 DIAGNOSIS — E785 Hyperlipidemia, unspecified: Secondary | ICD-10-CM | POA: Diagnosis not present

## 2022-06-09 DIAGNOSIS — E559 Vitamin D deficiency, unspecified: Secondary | ICD-10-CM | POA: Diagnosis not present

## 2022-06-09 DIAGNOSIS — Z87891 Personal history of nicotine dependence: Secondary | ICD-10-CM | POA: Diagnosis not present

## 2022-06-09 DIAGNOSIS — J45909 Unspecified asthma, uncomplicated: Secondary | ICD-10-CM | POA: Diagnosis not present

## 2022-06-09 DIAGNOSIS — E875 Hyperkalemia: Secondary | ICD-10-CM | POA: Diagnosis not present

## 2022-06-09 DIAGNOSIS — G8929 Other chronic pain: Secondary | ICD-10-CM | POA: Diagnosis not present

## 2022-06-09 DIAGNOSIS — I129 Hypertensive chronic kidney disease with stage 1 through stage 4 chronic kidney disease, or unspecified chronic kidney disease: Secondary | ICD-10-CM | POA: Diagnosis not present

## 2022-06-09 DIAGNOSIS — N1831 Chronic kidney disease, stage 3a: Secondary | ICD-10-CM | POA: Diagnosis not present

## 2022-06-09 DIAGNOSIS — F32A Depression, unspecified: Secondary | ICD-10-CM | POA: Diagnosis not present

## 2022-06-09 DIAGNOSIS — M549 Dorsalgia, unspecified: Secondary | ICD-10-CM | POA: Diagnosis not present

## 2022-06-09 DIAGNOSIS — Z79891 Long term (current) use of opiate analgesic: Secondary | ICD-10-CM | POA: Diagnosis not present

## 2022-06-09 DIAGNOSIS — D509 Iron deficiency anemia, unspecified: Secondary | ICD-10-CM | POA: Diagnosis not present

## 2022-06-11 DIAGNOSIS — N200 Calculus of kidney: Secondary | ICD-10-CM | POA: Diagnosis not present

## 2022-06-11 DIAGNOSIS — D509 Iron deficiency anemia, unspecified: Secondary | ICD-10-CM | POA: Diagnosis not present

## 2022-06-11 DIAGNOSIS — G8929 Other chronic pain: Secondary | ICD-10-CM | POA: Diagnosis not present

## 2022-06-11 DIAGNOSIS — M5489 Other dorsalgia: Secondary | ICD-10-CM | POA: Diagnosis not present

## 2022-06-11 DIAGNOSIS — E875 Hyperkalemia: Secondary | ICD-10-CM | POA: Diagnosis not present

## 2022-06-11 DIAGNOSIS — I129 Hypertensive chronic kidney disease with stage 1 through stage 4 chronic kidney disease, or unspecified chronic kidney disease: Secondary | ICD-10-CM | POA: Diagnosis not present

## 2022-06-11 DIAGNOSIS — J45909 Unspecified asthma, uncomplicated: Secondary | ICD-10-CM | POA: Diagnosis not present

## 2022-06-11 DIAGNOSIS — N1831 Chronic kidney disease, stage 3a: Secondary | ICD-10-CM | POA: Diagnosis not present

## 2022-06-11 DIAGNOSIS — K449 Diaphragmatic hernia without obstruction or gangrene: Secondary | ICD-10-CM | POA: Diagnosis not present

## 2022-06-12 DIAGNOSIS — J45909 Unspecified asthma, uncomplicated: Secondary | ICD-10-CM | POA: Diagnosis not present

## 2022-06-12 DIAGNOSIS — N1831 Chronic kidney disease, stage 3a: Secondary | ICD-10-CM | POA: Diagnosis not present

## 2022-06-12 DIAGNOSIS — Z79891 Long term (current) use of opiate analgesic: Secondary | ICD-10-CM | POA: Diagnosis not present

## 2022-06-12 DIAGNOSIS — N200 Calculus of kidney: Secondary | ICD-10-CM | POA: Diagnosis not present

## 2022-06-12 DIAGNOSIS — K449 Diaphragmatic hernia without obstruction or gangrene: Secondary | ICD-10-CM | POA: Diagnosis not present

## 2022-06-12 DIAGNOSIS — D509 Iron deficiency anemia, unspecified: Secondary | ICD-10-CM | POA: Diagnosis not present

## 2022-06-12 DIAGNOSIS — E559 Vitamin D deficiency, unspecified: Secondary | ICD-10-CM | POA: Diagnosis not present

## 2022-06-12 DIAGNOSIS — E875 Hyperkalemia: Secondary | ICD-10-CM | POA: Diagnosis not present

## 2022-06-12 DIAGNOSIS — F32A Depression, unspecified: Secondary | ICD-10-CM | POA: Diagnosis not present

## 2022-06-12 DIAGNOSIS — G8929 Other chronic pain: Secondary | ICD-10-CM | POA: Diagnosis not present

## 2022-06-12 DIAGNOSIS — M549 Dorsalgia, unspecified: Secondary | ICD-10-CM | POA: Diagnosis not present

## 2022-06-12 DIAGNOSIS — E785 Hyperlipidemia, unspecified: Secondary | ICD-10-CM | POA: Diagnosis not present

## 2022-06-12 DIAGNOSIS — Z87891 Personal history of nicotine dependence: Secondary | ICD-10-CM | POA: Diagnosis not present

## 2022-06-12 DIAGNOSIS — I129 Hypertensive chronic kidney disease with stage 1 through stage 4 chronic kidney disease, or unspecified chronic kidney disease: Secondary | ICD-10-CM | POA: Diagnosis not present

## 2022-06-12 DIAGNOSIS — Z9181 History of falling: Secondary | ICD-10-CM | POA: Diagnosis not present

## 2022-06-16 DIAGNOSIS — E875 Hyperkalemia: Secondary | ICD-10-CM | POA: Diagnosis not present

## 2022-06-16 DIAGNOSIS — E559 Vitamin D deficiency, unspecified: Secondary | ICD-10-CM | POA: Diagnosis not present

## 2022-06-16 DIAGNOSIS — Z79891 Long term (current) use of opiate analgesic: Secondary | ICD-10-CM | POA: Diagnosis not present

## 2022-06-16 DIAGNOSIS — N200 Calculus of kidney: Secondary | ICD-10-CM | POA: Diagnosis not present

## 2022-06-16 DIAGNOSIS — D509 Iron deficiency anemia, unspecified: Secondary | ICD-10-CM | POA: Diagnosis not present

## 2022-06-16 DIAGNOSIS — J45909 Unspecified asthma, uncomplicated: Secondary | ICD-10-CM | POA: Diagnosis not present

## 2022-06-16 DIAGNOSIS — Z87891 Personal history of nicotine dependence: Secondary | ICD-10-CM | POA: Diagnosis not present

## 2022-06-16 DIAGNOSIS — M549 Dorsalgia, unspecified: Secondary | ICD-10-CM | POA: Diagnosis not present

## 2022-06-16 DIAGNOSIS — Z9181 History of falling: Secondary | ICD-10-CM | POA: Diagnosis not present

## 2022-06-16 DIAGNOSIS — I129 Hypertensive chronic kidney disease with stage 1 through stage 4 chronic kidney disease, or unspecified chronic kidney disease: Secondary | ICD-10-CM | POA: Diagnosis not present

## 2022-06-16 DIAGNOSIS — N1831 Chronic kidney disease, stage 3a: Secondary | ICD-10-CM | POA: Diagnosis not present

## 2022-06-16 DIAGNOSIS — G8929 Other chronic pain: Secondary | ICD-10-CM | POA: Diagnosis not present

## 2022-06-16 DIAGNOSIS — E785 Hyperlipidemia, unspecified: Secondary | ICD-10-CM | POA: Diagnosis not present

## 2022-06-16 DIAGNOSIS — K449 Diaphragmatic hernia without obstruction or gangrene: Secondary | ICD-10-CM | POA: Diagnosis not present

## 2022-06-16 DIAGNOSIS — F32A Depression, unspecified: Secondary | ICD-10-CM | POA: Diagnosis not present

## 2022-06-24 ENCOUNTER — Encounter: Payer: Self-pay | Admitting: Student in an Organized Health Care Education/Training Program

## 2022-06-24 ENCOUNTER — Ambulatory Visit
Payer: Medicare Other | Attending: Student in an Organized Health Care Education/Training Program | Admitting: Student in an Organized Health Care Education/Training Program

## 2022-06-24 VITALS — BP 132/80 | HR 69 | Temp 97.8°F | Resp 16 | Ht 62.5 in | Wt 170.0 lb

## 2022-06-24 DIAGNOSIS — G8929 Other chronic pain: Secondary | ICD-10-CM | POA: Diagnosis not present

## 2022-06-24 DIAGNOSIS — M25512 Pain in left shoulder: Secondary | ICD-10-CM | POA: Diagnosis not present

## 2022-06-24 DIAGNOSIS — M25511 Pain in right shoulder: Secondary | ICD-10-CM | POA: Diagnosis not present

## 2022-06-24 DIAGNOSIS — G894 Chronic pain syndrome: Secondary | ICD-10-CM | POA: Diagnosis not present

## 2022-06-24 DIAGNOSIS — Z91148 Patient's other noncompliance with medication regimen for other reason: Secondary | ICD-10-CM | POA: Insufficient documentation

## 2022-06-24 DIAGNOSIS — M48062 Spinal stenosis, lumbar region with neurogenic claudication: Secondary | ICD-10-CM | POA: Diagnosis not present

## 2022-06-24 DIAGNOSIS — M5416 Radiculopathy, lumbar region: Secondary | ICD-10-CM | POA: Diagnosis not present

## 2022-06-24 DIAGNOSIS — M961 Postlaminectomy syndrome, not elsewhere classified: Secondary | ICD-10-CM | POA: Diagnosis not present

## 2022-06-24 NOTE — Progress Notes (Signed)
PROVIDER NOTE: Information contained herein reflects review and annotations entered in association with encounter. Interpretation of such information and data should be left to medically-trained personnel. Information provided to patient can be located elsewhere in the medical record under "Patient Instructions". Document created using STT-dictation technology, any transcriptional errors that may result from process are unintentional.    [name]  Service Category: E/M  Provider: Gillis Santa, MD  DOB: 17-May-1941  DOS: 06/24/2022  Referring Provider: Gladstone Lighter, MD  MRN: [id number]  Specialty: Interventional Pain Management  PCP: Gladstone Lighter, MD  Type: Established Patient  Setting: Ambulatory outpatient    Location: Office  Delivery: Face-to-face     HPI  Ms. Kenyah Luba, a 81 y.o. year old female, is here today because of her Pain management contract terminated [Z91.148]. Ms. Fernandez primary complain today is Back Pain and Shoulder Pain (right) Last encounter: My last encounter with her was on 05/29/2022. Pertinent problems: Ms. Depriest does not have any pertinent problems on file.Pain Assessment: Severity of Chronic pain is reported as a 8 /10. Location: Back Lower/through hips and down legs bilaterally to calves; RIGHT side is worse. Onset: More than a month ago. Quality: Hervey Ard, Shooting, Constant. Timing: Constant. Modifying factor(s): meds. Vitals:  height is 5' 2.5" (1.588 m) and weight is 170 lb (77.1 kg). Her temporal temperature is 97.8 F (36.6 C). Her blood pressure is 132/80 and her pulse is 69. Her respiration is 16 and oxygen saturation is 96%.   Reason for encounter:    Shacarra presents today for medication management.  Unfortunately, she was not compliant with recommendations of how to take her medications properly after clear instructions were provided to her and her husband.  At this point, she is no longer a candidate for chronic opioid therapy.  I am  concerned about her cognitive status she does not recall as having this conversation on 05/29/2022.  In the previous note, there has been concern of diversion via family members.  Patient filled her prescription on 06/12/2022 and states that she is currently out of her medications.  Her instructions were only to take 1 tablet/day.  She has overtaken her medications.  Her medication bottle is empty.  She can follow-up with her primary care provider for medication management.  Please see HPI from 05/29/2022: Aliegha presents today for medication management.  She complains of diffuse whole body pain most pronounced in her shoulder, low back, bilateral legs.  Of note, there was disagreement between her and her husband about her hydrocodone.  She was hospitalized for 4 days for hyperkalemia.  Furthermore there is concern for anxiety attack, panic, trembling documented in primary care note, this may have been related to overtaking her medications.  She received a quantity of 60 hydrocodone on 7/3 but on 7/11 she only had a quantity of 2 left.  She has no tablets today.  There is concern of a family member perhaps stealing them.     Jacklyn was insistent on allowing her to continue with pain management here.  Our nurse manager, Cherly Anderson had an extensive discussion with her, in the absence of her husband to confirm safety.  Safety was confirmed however they are having some internal family issues.  We discussed strategies of how she could manage her own medications and we recommended they get a pillbox and a lock box.  I informed Joscelyne that this is her only warning and that should she have a pill count discrepancy in the future, I will  be violating her pain contract given safety risk.  Patient endorsed understanding.   Of note, I took on the patient for medication management at her last visit which was on 5/31/ 2023.  I have limited interaction with her since this is only my third visit.  I am unclear of what her cognitive  baseline is.   Pain management contract will be violated if this happens again.    Pharmacotherapy Assessment  Analgesic: prescribed Hydrocodone 5 mg daily for moderate-severe pain however non compliant with instructions and over took medications  Monitoring: [place] PMP: PDMP reviewed during this encounter.       Pharmacotherapy: No side-effects or adverse reactions reported. Compliance: No problems identified. Effectiveness: Clinically acceptable.  Rise Patience, RN  06/24/2022 10:14 AM  Sign when Signing Visit Nursing Pain Medication Assessment:  Safety precautions to be maintained throughout the outpatient stay will include: orient to surroundings, keep bed in low position, maintain call bell within reach at all times, provide assistance with transfer out of bed and ambulation.  Medication Inspection Compliance: Pill count conducted under aseptic conditions, in front of the patient. Neither the pills nor the bottle was removed from the patient's sight at any time. Once count was completed pills were immediately returned to the patient in their original bottle.  Medication: Hydrocodone/APAP Pill/Patch Count:  0 of 30 pills remain Pill/Patch Appearance:  no pills left to assess Bottle Appearance: Standard pharmacy container. Clearly labeled. Filled Date: 08 / 03 / 2023 Last Medication intake:   Pt. States she ran out of pills 4 days ago"    No results found for: "CBDTHCR" No results found for: "D8THCCBX" No results found for: "D9THCCBX"  UDS:  Summary  Date Value Ref Range Status  03/18/2022 Note  Final    Comment:    ==================================================================== Compliance Drug Analysis, Ur ==================================================================== Test                             Result       Flag       Units  Drug Present not Declared for Prescription Verification   Temazepam                      50           UNEXPECTED ng/mg creat     Temazepam may be administered as a scheduled prescription medication;    it is also an expected metabolite of diazepam.    Hydrocodone                    1034         UNEXPECTED ng/mg creat   Norhydrocodone                 1308         UNEXPECTED ng/mg creat    Sources of hydrocodone include scheduled prescription medications.    Norhydrocodone is an expected metabolite of hydrocodone.    Gabapentin                     PRESENT      UNEXPECTED   Cyclobenzaprine                PRESENT      UNEXPECTED   Desmethylcyclobenzaprine       PRESENT      UNEXPECTED    Desmethylcyclobenzaprine is an expected metabolite of  cyclobenzaprine.    Citalopram                     PRESENT      UNEXPECTED   Desmethylcitalopram            PRESENT      UNEXPECTED    Desmethylcitalopram is an expected metabolite of citalopram or the    enantiomeric form, escitalopram.    Duloxetine                     PRESENT      UNEXPECTED   Acetaminophen                  PRESENT      UNEXPECTED  Drug Absent but Declared for Prescription Verification   Pregabalin                     Not Detected UNEXPECTED ==================================================================== Test                      Result    Flag   Units      Ref Range   Creatinine              80               mg/dL      >=20 ==================================================================== Declared Medications:  The flagging and interpretation on this report are based on the  following declared medications.  Unexpected results may arise from  inaccuracies in the declared medications.   **Note: The testing scope of this panel includes these medications:   Pregabalin (Lyrica)   **Note: The testing scope of this panel does not include the  following reported medications:   Atropine (Lomotil)  Carvedilol (Coreg)  Celecoxib (Celebrex)  Cetirizine (Zyrtec)  Cholecalciferol  Colestipol (Colestid)  Cyanocobalamin  Diphenoxylate (Lomotil)   Hydrochlorothiazide (Maxzide)  Meclizine (Antivert)  Oxybutynin (Ditropan)  Pancrelipase (Creon)  Triamterene (Maxzide) ==================================================================== For clinical consultation, please call [phone]. ====================================================================       ROS  Constitutional: Denies any fever or chills Gastrointestinal: No reported hemesis, hematochezia, vomiting, or acute GI distress Musculoskeletal:  Diffuse arthralgias and myalgias. Neurological: No reported episodes of acute onset apraxia, aphasia, dysarthria, agnosia, amnesia, paralysis, loss of coordination, or loss of consciousness  Medication Review  Cholecalciferol, Cyanocobalamin, DULoxetine, HYDROcodone-acetaminophen, carvedilol, cetirizine, citalopram, colestipol, diclofenac Sodium, gabapentin, and triamterene-hydrochlorothiazide  History Review  Allergy: Ms. Buskirk is allergic to other, codeine, and penicillins. Drug: Ms. Carpenter  reports no history of drug use. Alcohol:  reports no history of alcohol use. Tobacco:  reports that she has quit smoking. Her smoking use included cigarettes. She has a 30.00 pack-year smoking history. She has never used smokeless tobacco. Social: Ms. Mailloux  reports that she has quit smoking. Her smoking use included cigarettes. She has a 30.00 pack-year smoking history. She has never used smokeless tobacco. She reports that she does not drink alcohol and does not use drugs. Medical:  has a past medical history of Anxiety, Asthma, Complication of anesthesia, GI bleed, Hiatal hernia, Hypertension, and IDA (iron deficiency anemia). Surgical: Ms. Lasecki  has a past surgical history that includes Colonoscopy (04/28/2012); Breast biopsy (Left, 01/17/2002); Upper gi endoscopy (04/28/2012); Colonoscopy (02-12-15); Esophageal manometry (N/A, 06/13/2015); Esophagus surgery (Oct 2016); Paraesophageal hernia repair (07-26-15); Parathyroidectomy (Left);  Abdominal hysterectomy; Knee arthroscopy (Left); collapsed  lung (Left); Bilateral carpal tunnel release; Total hip arthroplasty (Left, 05/12/2017); Back surgery; and Total  hip arthroplasty (Right, 03/04/2018). Family: family history is not on file.  Laboratory Chemistry Profile   Renal Lab Results  Component Value Date   BUN 46 (H) 05/22/2022   CREATININE 1.40 (H) 05/22/2022   GFRAA 47 (L) 07/03/2019   GFRNONAA 38 (L) 05/22/2022    Hepatic Lab Results  Component Value Date   AST 16 05/21/2022   ALT 10 05/21/2022   ALBUMIN 3.4 (L) 05/21/2022   ALKPHOS 43 05/21/2022   LIPASE 47 05/20/2022   AMMONIA 20 05/20/2022    Electrolytes Lab Results  Component Value Date   NA 140 05/22/2022   K 4.7 05/22/2022   CL 116 (H) 05/22/2022   CALCIUM 8.1 (L) 05/22/2022   MG 2.0 05/22/2022   PHOS 3.9 05/21/2022    Bone No results found for: "VD25OH", "VD125OH2TOT", "YF7494WH6", "PR9163WG6", "25OHVITD1", "25OHVITD2", "25OHVITD3", "TESTOFREE", "TESTOSTERONE"  Inflammation (CRP: Acute Phase) (ESR: Chronic Phase) Lab Results  Component Value Date   ESRSEDRATE 54 (H) 02/18/2018         Note: Above Lab results reviewed.  Recent Imaging Review  CT HEAD WO CONTRAST (5MM) CLINICAL DATA:  Status post fall on Sunday.  Headaches and dizziness  EXAM: CT HEAD WITHOUT CONTRAST  TECHNIQUE: Contiguous axial images were obtained from the base of the skull through the vertex without intravenous contrast.  RADIATION DOSE REDUCTION: This exam was performed according to the departmental dose-optimization program which includes automated exposure control, adjustment of the mA and/or kV according to patient size and/or use of iterative reconstruction technique.  COMPARISON:  05/20/2022  FINDINGS: Brain: No evidence of acute infarction, hemorrhage, extra-axial collection, ventriculomegaly, or mass effect. Generalized cerebral atrophy. Periventricular white matter low attenuation likely secondary to  microangiopathy.  Vascular: Cerebrovascular atherosclerotic calcifications are noted.  Skull: Negative for fracture or focal lesion.  Sinuses/Orbits: Visualized portions of the orbits are unremarkable. Visualized portions of the paranasal sinuses are unremarkable. Visualized portions of the mastoid air cells are unremarkable.  Other: None.  IMPRESSION: 1. No acute intracranial findings. 2. Chronic small vessel ischemic changes.  Electronically Signed   By: Kathreen Devoid M.D.   On: 05/29/2022 13:13 Note: Reviewed        Physical Exam  General appearance: Well nourished, well developed, and well hydrated. In no apparent acute distress Mental status: Alert, oriented x 3 (person, place, & time)       Respiratory: No evidence of acute respiratory distress Eyes: PERLA Vitals: BP 132/80   Pulse 69   Temp 97.8 F (36.6 C) (Temporal)   Resp 16   Ht 5' 2.5" (1.588 m)   Wt 170 lb (77.1 kg)   SpO2 96%   BMI 30.60 kg/m  BMI: Estimated body mass index is 30.6 kg/m as calculated from the following:   Height as of this encounter: 5' 2.5" (1.588 m).   Weight as of this encounter: 170 lb (77.1 kg). Ideal: Ideal body weight: 51.3 kg (112 lb 15.8 oz) Adjusted ideal body weight: 61.6 kg (135 lb 12.7 oz)  Assessment   Diagnosis Status  1. Pain management contract terminated   2. Failed back surgical syndrome   3. Spinal stenosis of lumbar region with neurogenic claudication   4. Chronic pain of both shoulders   5. Chronic pain syndrome   6. Chronic radicular lumbar pain    Controlled Controlled Controlled   Updated Problems: Problem  Pain Management Contract Terminated    Plan of Care   Follow-up with primary care provider for non-opioid based med  management.  Follow-up plan:   No follow-ups on file.    Recent Visits Date Type Provider Dept  05/29/22 Office Visit Gillis Santa, MD [facility]  04/09/22 Office Visit Gillis Santa, MD [facility]   Showing recent visits within past 90 days and meeting all other requirements Today's Visits Date Type Provider Dept  06/24/22 Office Visit Gillis Santa, MD [facility]  Showing today's visits and meeting all other requirements Future Appointments No visits were found meeting these conditions. Showing future appointments within next 90 days and meeting all other requirements  I discussed the assessment and treatment plan with the patient. The patient was provided an opportunity to ask questions and all were answered. The patient agreed with the plan and demonstrated an understanding of the instructions.  Patient advised to call back or seek an in-person evaluation if the symptoms or condition worsens.  Duration of encounter:77mnutes.  Total time on encounter, as per AMA guidelines included both the face-to-face and non-face-to-face time personally spent by the physician and/or other qualified health care professional(s) on the day of the encounter (includes time in activities that require the physician or other qualified health care professional and does not include time in activities normally performed by clinical staff). Physician's time may include the following activities when performed: preparing to see the patient (eg, review of tests, pre-charting review of records) obtaining and/or reviewing separately obtained history performing a medically appropriate examination and/or evaluation counseling and educating the patient/family/caregiver ordering medications, tests, or procedures referring and communicating with other health care professionals (when not separately reported) documenting clinical information in the electronic or other health record independently interpreting results (not separately reported) and communicating results to the patient/ family/caregiver care coordination (not separately reported)  Note by: BGillis Santa MD Date: 06/24/2022; Time: 10:34 AM

## 2022-06-24 NOTE — Patient Instructions (Signed)
Violation of pain contract Follow up with PCP

## 2022-06-24 NOTE — Progress Notes (Signed)
Nursing Pain Medication Assessment:  Safety precautions to be maintained throughout the outpatient stay will include: orient to surroundings, keep bed in low position, maintain call bell within reach at all times, provide assistance with transfer out of bed and ambulation.  Medication Inspection Compliance: Pill count conducted under aseptic conditions, in front of the patient. Neither the pills nor the bottle was removed from the patient's sight at any time. Once count was completed pills were immediately returned to the patient in their original bottle.  Medication: Hydrocodone/APAP Pill/Patch Count:  0 of 30 pills remain Pill/Patch Appearance:  no pills left to assess Bottle Appearance: Standard pharmacy container. Clearly labeled. Filled Date: 08 / 03 / 2023 Last Medication intake:   Pt. States she ran out of pills 4 days ago"

## 2022-06-27 DIAGNOSIS — J45909 Unspecified asthma, uncomplicated: Secondary | ICD-10-CM | POA: Diagnosis not present

## 2022-06-27 DIAGNOSIS — K449 Diaphragmatic hernia without obstruction or gangrene: Secondary | ICD-10-CM | POA: Diagnosis not present

## 2022-06-27 DIAGNOSIS — G8929 Other chronic pain: Secondary | ICD-10-CM | POA: Diagnosis not present

## 2022-06-27 DIAGNOSIS — E559 Vitamin D deficiency, unspecified: Secondary | ICD-10-CM | POA: Diagnosis not present

## 2022-06-27 DIAGNOSIS — D509 Iron deficiency anemia, unspecified: Secondary | ICD-10-CM | POA: Diagnosis not present

## 2022-06-27 DIAGNOSIS — F32A Depression, unspecified: Secondary | ICD-10-CM | POA: Diagnosis not present

## 2022-06-27 DIAGNOSIS — N1831 Chronic kidney disease, stage 3a: Secondary | ICD-10-CM | POA: Diagnosis not present

## 2022-06-27 DIAGNOSIS — Z9181 History of falling: Secondary | ICD-10-CM | POA: Diagnosis not present

## 2022-06-27 DIAGNOSIS — Z87891 Personal history of nicotine dependence: Secondary | ICD-10-CM | POA: Diagnosis not present

## 2022-06-27 DIAGNOSIS — Z79891 Long term (current) use of opiate analgesic: Secondary | ICD-10-CM | POA: Diagnosis not present

## 2022-06-27 DIAGNOSIS — E785 Hyperlipidemia, unspecified: Secondary | ICD-10-CM | POA: Diagnosis not present

## 2022-06-27 DIAGNOSIS — M549 Dorsalgia, unspecified: Secondary | ICD-10-CM | POA: Diagnosis not present

## 2022-06-27 DIAGNOSIS — N200 Calculus of kidney: Secondary | ICD-10-CM | POA: Diagnosis not present

## 2022-06-27 DIAGNOSIS — I129 Hypertensive chronic kidney disease with stage 1 through stage 4 chronic kidney disease, or unspecified chronic kidney disease: Secondary | ICD-10-CM | POA: Diagnosis not present

## 2022-06-27 DIAGNOSIS — E875 Hyperkalemia: Secondary | ICD-10-CM | POA: Diagnosis not present

## 2022-07-02 DIAGNOSIS — Z9181 History of falling: Secondary | ICD-10-CM | POA: Diagnosis not present

## 2022-07-02 DIAGNOSIS — G8929 Other chronic pain: Secondary | ICD-10-CM | POA: Diagnosis not present

## 2022-07-02 DIAGNOSIS — Z87891 Personal history of nicotine dependence: Secondary | ICD-10-CM | POA: Diagnosis not present

## 2022-07-02 DIAGNOSIS — D509 Iron deficiency anemia, unspecified: Secondary | ICD-10-CM | POA: Diagnosis not present

## 2022-07-02 DIAGNOSIS — N200 Calculus of kidney: Secondary | ICD-10-CM | POA: Diagnosis not present

## 2022-07-02 DIAGNOSIS — J45909 Unspecified asthma, uncomplicated: Secondary | ICD-10-CM | POA: Diagnosis not present

## 2022-07-02 DIAGNOSIS — N1831 Chronic kidney disease, stage 3a: Secondary | ICD-10-CM | POA: Diagnosis not present

## 2022-07-02 DIAGNOSIS — F32A Depression, unspecified: Secondary | ICD-10-CM | POA: Diagnosis not present

## 2022-07-02 DIAGNOSIS — M549 Dorsalgia, unspecified: Secondary | ICD-10-CM | POA: Diagnosis not present

## 2022-07-02 DIAGNOSIS — I129 Hypertensive chronic kidney disease with stage 1 through stage 4 chronic kidney disease, or unspecified chronic kidney disease: Secondary | ICD-10-CM | POA: Diagnosis not present

## 2022-07-02 DIAGNOSIS — K449 Diaphragmatic hernia without obstruction or gangrene: Secondary | ICD-10-CM | POA: Diagnosis not present

## 2022-07-02 DIAGNOSIS — E875 Hyperkalemia: Secondary | ICD-10-CM | POA: Diagnosis not present

## 2022-07-02 DIAGNOSIS — E785 Hyperlipidemia, unspecified: Secondary | ICD-10-CM | POA: Diagnosis not present

## 2022-07-02 DIAGNOSIS — Z79891 Long term (current) use of opiate analgesic: Secondary | ICD-10-CM | POA: Diagnosis not present

## 2022-07-02 DIAGNOSIS — E559 Vitamin D deficiency, unspecified: Secondary | ICD-10-CM | POA: Diagnosis not present

## 2022-07-08 DIAGNOSIS — Z79891 Long term (current) use of opiate analgesic: Secondary | ICD-10-CM | POA: Diagnosis not present

## 2022-07-08 DIAGNOSIS — F32A Depression, unspecified: Secondary | ICD-10-CM | POA: Diagnosis not present

## 2022-07-08 DIAGNOSIS — D509 Iron deficiency anemia, unspecified: Secondary | ICD-10-CM | POA: Diagnosis not present

## 2022-07-08 DIAGNOSIS — I129 Hypertensive chronic kidney disease with stage 1 through stage 4 chronic kidney disease, or unspecified chronic kidney disease: Secondary | ICD-10-CM | POA: Diagnosis not present

## 2022-07-08 DIAGNOSIS — G8929 Other chronic pain: Secondary | ICD-10-CM | POA: Diagnosis not present

## 2022-07-08 DIAGNOSIS — Z87891 Personal history of nicotine dependence: Secondary | ICD-10-CM | POA: Diagnosis not present

## 2022-07-08 DIAGNOSIS — N1831 Chronic kidney disease, stage 3a: Secondary | ICD-10-CM | POA: Diagnosis not present

## 2022-07-08 DIAGNOSIS — E875 Hyperkalemia: Secondary | ICD-10-CM | POA: Diagnosis not present

## 2022-07-08 DIAGNOSIS — K449 Diaphragmatic hernia without obstruction or gangrene: Secondary | ICD-10-CM | POA: Diagnosis not present

## 2022-07-08 DIAGNOSIS — E785 Hyperlipidemia, unspecified: Secondary | ICD-10-CM | POA: Diagnosis not present

## 2022-07-08 DIAGNOSIS — Z9181 History of falling: Secondary | ICD-10-CM | POA: Diagnosis not present

## 2022-07-08 DIAGNOSIS — N200 Calculus of kidney: Secondary | ICD-10-CM | POA: Diagnosis not present

## 2022-07-08 DIAGNOSIS — J45909 Unspecified asthma, uncomplicated: Secondary | ICD-10-CM | POA: Diagnosis not present

## 2022-07-08 DIAGNOSIS — M549 Dorsalgia, unspecified: Secondary | ICD-10-CM | POA: Diagnosis not present

## 2022-07-08 DIAGNOSIS — E559 Vitamin D deficiency, unspecified: Secondary | ICD-10-CM | POA: Diagnosis not present

## 2022-07-18 DIAGNOSIS — E559 Vitamin D deficiency, unspecified: Secondary | ICD-10-CM | POA: Diagnosis not present

## 2022-07-18 DIAGNOSIS — K449 Diaphragmatic hernia without obstruction or gangrene: Secondary | ICD-10-CM | POA: Diagnosis not present

## 2022-07-18 DIAGNOSIS — E785 Hyperlipidemia, unspecified: Secondary | ICD-10-CM | POA: Diagnosis not present

## 2022-07-18 DIAGNOSIS — N200 Calculus of kidney: Secondary | ICD-10-CM | POA: Diagnosis not present

## 2022-07-18 DIAGNOSIS — J45909 Unspecified asthma, uncomplicated: Secondary | ICD-10-CM | POA: Diagnosis not present

## 2022-07-18 DIAGNOSIS — G8929 Other chronic pain: Secondary | ICD-10-CM | POA: Diagnosis not present

## 2022-07-18 DIAGNOSIS — Z79891 Long term (current) use of opiate analgesic: Secondary | ICD-10-CM | POA: Diagnosis not present

## 2022-07-18 DIAGNOSIS — F32A Depression, unspecified: Secondary | ICD-10-CM | POA: Diagnosis not present

## 2022-07-18 DIAGNOSIS — Z87891 Personal history of nicotine dependence: Secondary | ICD-10-CM | POA: Diagnosis not present

## 2022-07-18 DIAGNOSIS — E875 Hyperkalemia: Secondary | ICD-10-CM | POA: Diagnosis not present

## 2022-07-18 DIAGNOSIS — N1831 Chronic kidney disease, stage 3a: Secondary | ICD-10-CM | POA: Diagnosis not present

## 2022-07-18 DIAGNOSIS — D509 Iron deficiency anemia, unspecified: Secondary | ICD-10-CM | POA: Diagnosis not present

## 2022-07-18 DIAGNOSIS — I129 Hypertensive chronic kidney disease with stage 1 through stage 4 chronic kidney disease, or unspecified chronic kidney disease: Secondary | ICD-10-CM | POA: Diagnosis not present

## 2022-07-18 DIAGNOSIS — M549 Dorsalgia, unspecified: Secondary | ICD-10-CM | POA: Diagnosis not present

## 2022-07-18 DIAGNOSIS — Z9181 History of falling: Secondary | ICD-10-CM | POA: Diagnosis not present

## 2022-07-25 DIAGNOSIS — F32A Depression, unspecified: Secondary | ICD-10-CM | POA: Diagnosis not present

## 2022-07-25 DIAGNOSIS — M549 Dorsalgia, unspecified: Secondary | ICD-10-CM | POA: Diagnosis not present

## 2022-07-25 DIAGNOSIS — K449 Diaphragmatic hernia without obstruction or gangrene: Secondary | ICD-10-CM | POA: Diagnosis not present

## 2022-07-25 DIAGNOSIS — Z9181 History of falling: Secondary | ICD-10-CM | POA: Diagnosis not present

## 2022-07-25 DIAGNOSIS — G8929 Other chronic pain: Secondary | ICD-10-CM | POA: Diagnosis not present

## 2022-07-25 DIAGNOSIS — J45909 Unspecified asthma, uncomplicated: Secondary | ICD-10-CM | POA: Diagnosis not present

## 2022-07-25 DIAGNOSIS — D509 Iron deficiency anemia, unspecified: Secondary | ICD-10-CM | POA: Diagnosis not present

## 2022-07-25 DIAGNOSIS — N1831 Chronic kidney disease, stage 3a: Secondary | ICD-10-CM | POA: Diagnosis not present

## 2022-07-25 DIAGNOSIS — E559 Vitamin D deficiency, unspecified: Secondary | ICD-10-CM | POA: Diagnosis not present

## 2022-07-25 DIAGNOSIS — Z981 Arthrodesis status: Secondary | ICD-10-CM | POA: Diagnosis not present

## 2022-07-25 DIAGNOSIS — I129 Hypertensive chronic kidney disease with stage 1 through stage 4 chronic kidney disease, or unspecified chronic kidney disease: Secondary | ICD-10-CM | POA: Diagnosis not present

## 2022-07-25 DIAGNOSIS — Z96643 Presence of artificial hip joint, bilateral: Secondary | ICD-10-CM | POA: Diagnosis not present

## 2022-07-25 DIAGNOSIS — N200 Calculus of kidney: Secondary | ICD-10-CM | POA: Diagnosis not present

## 2022-07-25 DIAGNOSIS — E875 Hyperkalemia: Secondary | ICD-10-CM | POA: Diagnosis not present

## 2022-07-25 DIAGNOSIS — E782 Mixed hyperlipidemia: Secondary | ICD-10-CM | POA: Diagnosis not present

## 2022-07-25 DIAGNOSIS — Z79891 Long term (current) use of opiate analgesic: Secondary | ICD-10-CM | POA: Diagnosis not present

## 2022-07-25 DIAGNOSIS — Z87891 Personal history of nicotine dependence: Secondary | ICD-10-CM | POA: Diagnosis not present

## 2022-07-30 DIAGNOSIS — T50905A Adverse effect of unspecified drugs, medicaments and biological substances, initial encounter: Secondary | ICD-10-CM | POA: Diagnosis not present

## 2022-07-30 DIAGNOSIS — M5416 Radiculopathy, lumbar region: Secondary | ICD-10-CM | POA: Diagnosis not present

## 2022-07-30 DIAGNOSIS — G894 Chronic pain syndrome: Secondary | ICD-10-CM | POA: Diagnosis not present

## 2022-07-30 DIAGNOSIS — R410 Disorientation, unspecified: Secondary | ICD-10-CM | POA: Diagnosis not present

## 2022-07-30 DIAGNOSIS — Z23 Encounter for immunization: Secondary | ICD-10-CM | POA: Diagnosis not present

## 2022-07-30 DIAGNOSIS — I10 Essential (primary) hypertension: Secondary | ICD-10-CM | POA: Diagnosis not present

## 2022-07-30 DIAGNOSIS — E892 Postprocedural hypoparathyroidism: Secondary | ICD-10-CM | POA: Diagnosis not present

## 2022-07-31 DIAGNOSIS — E782 Mixed hyperlipidemia: Secondary | ICD-10-CM | POA: Diagnosis not present

## 2022-07-31 DIAGNOSIS — I129 Hypertensive chronic kidney disease with stage 1 through stage 4 chronic kidney disease, or unspecified chronic kidney disease: Secondary | ICD-10-CM | POA: Diagnosis not present

## 2022-07-31 DIAGNOSIS — M549 Dorsalgia, unspecified: Secondary | ICD-10-CM | POA: Diagnosis not present

## 2022-07-31 DIAGNOSIS — F32A Depression, unspecified: Secondary | ICD-10-CM | POA: Diagnosis not present

## 2022-07-31 DIAGNOSIS — G8929 Other chronic pain: Secondary | ICD-10-CM | POA: Diagnosis not present

## 2022-07-31 DIAGNOSIS — J45909 Unspecified asthma, uncomplicated: Secondary | ICD-10-CM | POA: Diagnosis not present

## 2022-07-31 DIAGNOSIS — Z9181 History of falling: Secondary | ICD-10-CM | POA: Diagnosis not present

## 2022-07-31 DIAGNOSIS — E559 Vitamin D deficiency, unspecified: Secondary | ICD-10-CM | POA: Diagnosis not present

## 2022-07-31 DIAGNOSIS — E875 Hyperkalemia: Secondary | ICD-10-CM | POA: Diagnosis not present

## 2022-07-31 DIAGNOSIS — K449 Diaphragmatic hernia without obstruction or gangrene: Secondary | ICD-10-CM | POA: Diagnosis not present

## 2022-07-31 DIAGNOSIS — N200 Calculus of kidney: Secondary | ICD-10-CM | POA: Diagnosis not present

## 2022-07-31 DIAGNOSIS — Z981 Arthrodesis status: Secondary | ICD-10-CM | POA: Diagnosis not present

## 2022-07-31 DIAGNOSIS — Z87891 Personal history of nicotine dependence: Secondary | ICD-10-CM | POA: Diagnosis not present

## 2022-07-31 DIAGNOSIS — R8290 Unspecified abnormal findings in urine: Secondary | ICD-10-CM | POA: Diagnosis not present

## 2022-07-31 DIAGNOSIS — D509 Iron deficiency anemia, unspecified: Secondary | ICD-10-CM | POA: Diagnosis not present

## 2022-07-31 DIAGNOSIS — Z96643 Presence of artificial hip joint, bilateral: Secondary | ICD-10-CM | POA: Diagnosis not present

## 2022-07-31 DIAGNOSIS — N1831 Chronic kidney disease, stage 3a: Secondary | ICD-10-CM | POA: Diagnosis not present

## 2022-07-31 DIAGNOSIS — Z79891 Long term (current) use of opiate analgesic: Secondary | ICD-10-CM | POA: Diagnosis not present

## 2022-08-06 DIAGNOSIS — N1831 Chronic kidney disease, stage 3a: Secondary | ICD-10-CM | POA: Diagnosis not present

## 2022-08-06 DIAGNOSIS — E559 Vitamin D deficiency, unspecified: Secondary | ICD-10-CM | POA: Diagnosis not present

## 2022-08-06 DIAGNOSIS — M549 Dorsalgia, unspecified: Secondary | ICD-10-CM | POA: Diagnosis not present

## 2022-08-06 DIAGNOSIS — I129 Hypertensive chronic kidney disease with stage 1 through stage 4 chronic kidney disease, or unspecified chronic kidney disease: Secondary | ICD-10-CM | POA: Diagnosis not present

## 2022-08-06 DIAGNOSIS — E875 Hyperkalemia: Secondary | ICD-10-CM | POA: Diagnosis not present

## 2022-08-06 DIAGNOSIS — G8929 Other chronic pain: Secondary | ICD-10-CM | POA: Diagnosis not present

## 2022-08-06 DIAGNOSIS — N200 Calculus of kidney: Secondary | ICD-10-CM | POA: Diagnosis not present

## 2022-08-06 DIAGNOSIS — Z96643 Presence of artificial hip joint, bilateral: Secondary | ICD-10-CM | POA: Diagnosis not present

## 2022-08-06 DIAGNOSIS — Z87891 Personal history of nicotine dependence: Secondary | ICD-10-CM | POA: Diagnosis not present

## 2022-08-06 DIAGNOSIS — Z9181 History of falling: Secondary | ICD-10-CM | POA: Diagnosis not present

## 2022-08-06 DIAGNOSIS — E782 Mixed hyperlipidemia: Secondary | ICD-10-CM | POA: Diagnosis not present

## 2022-08-06 DIAGNOSIS — J45909 Unspecified asthma, uncomplicated: Secondary | ICD-10-CM | POA: Diagnosis not present

## 2022-08-06 DIAGNOSIS — Z981 Arthrodesis status: Secondary | ICD-10-CM | POA: Diagnosis not present

## 2022-08-06 DIAGNOSIS — K449 Diaphragmatic hernia without obstruction or gangrene: Secondary | ICD-10-CM | POA: Diagnosis not present

## 2022-08-06 DIAGNOSIS — F32A Depression, unspecified: Secondary | ICD-10-CM | POA: Diagnosis not present

## 2022-08-06 DIAGNOSIS — Z79891 Long term (current) use of opiate analgesic: Secondary | ICD-10-CM | POA: Diagnosis not present

## 2022-08-06 DIAGNOSIS — D509 Iron deficiency anemia, unspecified: Secondary | ICD-10-CM | POA: Diagnosis not present

## 2022-08-12 DIAGNOSIS — N200 Calculus of kidney: Secondary | ICD-10-CM | POA: Diagnosis not present

## 2022-08-12 DIAGNOSIS — N1831 Chronic kidney disease, stage 3a: Secondary | ICD-10-CM | POA: Diagnosis not present

## 2022-08-12 DIAGNOSIS — E875 Hyperkalemia: Secondary | ICD-10-CM | POA: Diagnosis not present

## 2022-08-12 DIAGNOSIS — J45909 Unspecified asthma, uncomplicated: Secondary | ICD-10-CM | POA: Diagnosis not present

## 2022-08-12 DIAGNOSIS — F32A Depression, unspecified: Secondary | ICD-10-CM | POA: Diagnosis not present

## 2022-08-12 DIAGNOSIS — Z981 Arthrodesis status: Secondary | ICD-10-CM | POA: Diagnosis not present

## 2022-08-12 DIAGNOSIS — E559 Vitamin D deficiency, unspecified: Secondary | ICD-10-CM | POA: Diagnosis not present

## 2022-08-12 DIAGNOSIS — Z87891 Personal history of nicotine dependence: Secondary | ICD-10-CM | POA: Diagnosis not present

## 2022-08-12 DIAGNOSIS — M549 Dorsalgia, unspecified: Secondary | ICD-10-CM | POA: Diagnosis not present

## 2022-08-12 DIAGNOSIS — I129 Hypertensive chronic kidney disease with stage 1 through stage 4 chronic kidney disease, or unspecified chronic kidney disease: Secondary | ICD-10-CM | POA: Diagnosis not present

## 2022-08-12 DIAGNOSIS — Z96643 Presence of artificial hip joint, bilateral: Secondary | ICD-10-CM | POA: Diagnosis not present

## 2022-08-12 DIAGNOSIS — Z79891 Long term (current) use of opiate analgesic: Secondary | ICD-10-CM | POA: Diagnosis not present

## 2022-08-12 DIAGNOSIS — G8929 Other chronic pain: Secondary | ICD-10-CM | POA: Diagnosis not present

## 2022-08-12 DIAGNOSIS — D509 Iron deficiency anemia, unspecified: Secondary | ICD-10-CM | POA: Diagnosis not present

## 2022-08-12 DIAGNOSIS — E782 Mixed hyperlipidemia: Secondary | ICD-10-CM | POA: Diagnosis not present

## 2022-08-12 DIAGNOSIS — K449 Diaphragmatic hernia without obstruction or gangrene: Secondary | ICD-10-CM | POA: Diagnosis not present

## 2022-08-12 DIAGNOSIS — Z9181 History of falling: Secondary | ICD-10-CM | POA: Diagnosis not present

## 2022-08-19 DIAGNOSIS — N1831 Chronic kidney disease, stage 3a: Secondary | ICD-10-CM | POA: Diagnosis not present

## 2022-08-19 DIAGNOSIS — M5489 Other dorsalgia: Secondary | ICD-10-CM | POA: Diagnosis not present

## 2022-08-19 DIAGNOSIS — G8929 Other chronic pain: Secondary | ICD-10-CM | POA: Diagnosis not present

## 2022-08-19 DIAGNOSIS — N200 Calculus of kidney: Secondary | ICD-10-CM | POA: Diagnosis not present

## 2022-08-19 DIAGNOSIS — K449 Diaphragmatic hernia without obstruction or gangrene: Secondary | ICD-10-CM | POA: Diagnosis not present

## 2022-08-19 DIAGNOSIS — E785 Hyperlipidemia, unspecified: Secondary | ICD-10-CM | POA: Diagnosis not present

## 2022-08-19 DIAGNOSIS — J45909 Unspecified asthma, uncomplicated: Secondary | ICD-10-CM | POA: Diagnosis not present

## 2022-08-19 DIAGNOSIS — I129 Hypertensive chronic kidney disease with stage 1 through stage 4 chronic kidney disease, or unspecified chronic kidney disease: Secondary | ICD-10-CM | POA: Diagnosis not present

## 2022-08-20 DIAGNOSIS — E875 Hyperkalemia: Secondary | ICD-10-CM | POA: Diagnosis not present

## 2022-08-20 DIAGNOSIS — G8929 Other chronic pain: Secondary | ICD-10-CM | POA: Diagnosis not present

## 2022-08-20 DIAGNOSIS — I129 Hypertensive chronic kidney disease with stage 1 through stage 4 chronic kidney disease, or unspecified chronic kidney disease: Secondary | ICD-10-CM | POA: Diagnosis not present

## 2022-08-20 DIAGNOSIS — Z79891 Long term (current) use of opiate analgesic: Secondary | ICD-10-CM | POA: Diagnosis not present

## 2022-08-20 DIAGNOSIS — E559 Vitamin D deficiency, unspecified: Secondary | ICD-10-CM | POA: Diagnosis not present

## 2022-08-20 DIAGNOSIS — M549 Dorsalgia, unspecified: Secondary | ICD-10-CM | POA: Diagnosis not present

## 2022-08-20 DIAGNOSIS — D509 Iron deficiency anemia, unspecified: Secondary | ICD-10-CM | POA: Diagnosis not present

## 2022-08-20 DIAGNOSIS — Z87891 Personal history of nicotine dependence: Secondary | ICD-10-CM | POA: Diagnosis not present

## 2022-08-20 DIAGNOSIS — Z9181 History of falling: Secondary | ICD-10-CM | POA: Diagnosis not present

## 2022-08-20 DIAGNOSIS — F32A Depression, unspecified: Secondary | ICD-10-CM | POA: Diagnosis not present

## 2022-08-20 DIAGNOSIS — N200 Calculus of kidney: Secondary | ICD-10-CM | POA: Diagnosis not present

## 2022-08-20 DIAGNOSIS — E782 Mixed hyperlipidemia: Secondary | ICD-10-CM | POA: Diagnosis not present

## 2022-08-20 DIAGNOSIS — K449 Diaphragmatic hernia without obstruction or gangrene: Secondary | ICD-10-CM | POA: Diagnosis not present

## 2022-08-20 DIAGNOSIS — N1831 Chronic kidney disease, stage 3a: Secondary | ICD-10-CM | POA: Diagnosis not present

## 2022-08-20 DIAGNOSIS — Z981 Arthrodesis status: Secondary | ICD-10-CM | POA: Diagnosis not present

## 2022-08-20 DIAGNOSIS — Z96643 Presence of artificial hip joint, bilateral: Secondary | ICD-10-CM | POA: Diagnosis not present

## 2022-08-20 DIAGNOSIS — J45909 Unspecified asthma, uncomplicated: Secondary | ICD-10-CM | POA: Diagnosis not present

## 2022-08-28 DIAGNOSIS — N200 Calculus of kidney: Secondary | ICD-10-CM | POA: Diagnosis not present

## 2022-08-28 DIAGNOSIS — K449 Diaphragmatic hernia without obstruction or gangrene: Secondary | ICD-10-CM | POA: Diagnosis not present

## 2022-08-28 DIAGNOSIS — M549 Dorsalgia, unspecified: Secondary | ICD-10-CM | POA: Diagnosis not present

## 2022-08-28 DIAGNOSIS — Z981 Arthrodesis status: Secondary | ICD-10-CM | POA: Diagnosis not present

## 2022-08-28 DIAGNOSIS — J45909 Unspecified asthma, uncomplicated: Secondary | ICD-10-CM | POA: Diagnosis not present

## 2022-08-28 DIAGNOSIS — I129 Hypertensive chronic kidney disease with stage 1 through stage 4 chronic kidney disease, or unspecified chronic kidney disease: Secondary | ICD-10-CM | POA: Diagnosis not present

## 2022-08-28 DIAGNOSIS — Z96643 Presence of artificial hip joint, bilateral: Secondary | ICD-10-CM | POA: Diagnosis not present

## 2022-08-28 DIAGNOSIS — Z87891 Personal history of nicotine dependence: Secondary | ICD-10-CM | POA: Diagnosis not present

## 2022-08-28 DIAGNOSIS — G8929 Other chronic pain: Secondary | ICD-10-CM | POA: Diagnosis not present

## 2022-08-28 DIAGNOSIS — Z79891 Long term (current) use of opiate analgesic: Secondary | ICD-10-CM | POA: Diagnosis not present

## 2022-08-28 DIAGNOSIS — Z9181 History of falling: Secondary | ICD-10-CM | POA: Diagnosis not present

## 2022-08-28 DIAGNOSIS — D509 Iron deficiency anemia, unspecified: Secondary | ICD-10-CM | POA: Diagnosis not present

## 2022-08-28 DIAGNOSIS — E875 Hyperkalemia: Secondary | ICD-10-CM | POA: Diagnosis not present

## 2022-08-28 DIAGNOSIS — N1831 Chronic kidney disease, stage 3a: Secondary | ICD-10-CM | POA: Diagnosis not present

## 2022-08-28 DIAGNOSIS — E782 Mixed hyperlipidemia: Secondary | ICD-10-CM | POA: Diagnosis not present

## 2022-08-28 DIAGNOSIS — F32A Depression, unspecified: Secondary | ICD-10-CM | POA: Diagnosis not present

## 2022-08-28 DIAGNOSIS — E559 Vitamin D deficiency, unspecified: Secondary | ICD-10-CM | POA: Diagnosis not present

## 2022-09-03 DIAGNOSIS — E782 Mixed hyperlipidemia: Secondary | ICD-10-CM | POA: Diagnosis not present

## 2022-09-03 DIAGNOSIS — E559 Vitamin D deficiency, unspecified: Secondary | ICD-10-CM | POA: Diagnosis not present

## 2022-09-03 DIAGNOSIS — Z87891 Personal history of nicotine dependence: Secondary | ICD-10-CM | POA: Diagnosis not present

## 2022-09-03 DIAGNOSIS — M549 Dorsalgia, unspecified: Secondary | ICD-10-CM | POA: Diagnosis not present

## 2022-09-03 DIAGNOSIS — G8929 Other chronic pain: Secondary | ICD-10-CM | POA: Diagnosis not present

## 2022-09-03 DIAGNOSIS — I129 Hypertensive chronic kidney disease with stage 1 through stage 4 chronic kidney disease, or unspecified chronic kidney disease: Secondary | ICD-10-CM | POA: Diagnosis not present

## 2022-09-03 DIAGNOSIS — Z981 Arthrodesis status: Secondary | ICD-10-CM | POA: Diagnosis not present

## 2022-09-03 DIAGNOSIS — J45909 Unspecified asthma, uncomplicated: Secondary | ICD-10-CM | POA: Diagnosis not present

## 2022-09-03 DIAGNOSIS — F32A Depression, unspecified: Secondary | ICD-10-CM | POA: Diagnosis not present

## 2022-09-03 DIAGNOSIS — K449 Diaphragmatic hernia without obstruction or gangrene: Secondary | ICD-10-CM | POA: Diagnosis not present

## 2022-09-03 DIAGNOSIS — E875 Hyperkalemia: Secondary | ICD-10-CM | POA: Diagnosis not present

## 2022-09-03 DIAGNOSIS — N1831 Chronic kidney disease, stage 3a: Secondary | ICD-10-CM | POA: Diagnosis not present

## 2022-09-03 DIAGNOSIS — Z96643 Presence of artificial hip joint, bilateral: Secondary | ICD-10-CM | POA: Diagnosis not present

## 2022-09-03 DIAGNOSIS — N200 Calculus of kidney: Secondary | ICD-10-CM | POA: Diagnosis not present

## 2022-09-03 DIAGNOSIS — D509 Iron deficiency anemia, unspecified: Secondary | ICD-10-CM | POA: Diagnosis not present

## 2022-09-03 DIAGNOSIS — Z79891 Long term (current) use of opiate analgesic: Secondary | ICD-10-CM | POA: Diagnosis not present

## 2022-09-03 DIAGNOSIS — Z9181 History of falling: Secondary | ICD-10-CM | POA: Diagnosis not present

## 2022-09-09 DIAGNOSIS — N1831 Chronic kidney disease, stage 3a: Secondary | ICD-10-CM | POA: Diagnosis not present

## 2022-09-09 DIAGNOSIS — Z79891 Long term (current) use of opiate analgesic: Secondary | ICD-10-CM | POA: Diagnosis not present

## 2022-09-09 DIAGNOSIS — Z981 Arthrodesis status: Secondary | ICD-10-CM | POA: Diagnosis not present

## 2022-09-09 DIAGNOSIS — E782 Mixed hyperlipidemia: Secondary | ICD-10-CM | POA: Diagnosis not present

## 2022-09-09 DIAGNOSIS — N200 Calculus of kidney: Secondary | ICD-10-CM | POA: Diagnosis not present

## 2022-09-09 DIAGNOSIS — G8929 Other chronic pain: Secondary | ICD-10-CM | POA: Diagnosis not present

## 2022-09-09 DIAGNOSIS — Z9181 History of falling: Secondary | ICD-10-CM | POA: Diagnosis not present

## 2022-09-09 DIAGNOSIS — E875 Hyperkalemia: Secondary | ICD-10-CM | POA: Diagnosis not present

## 2022-09-09 DIAGNOSIS — Z96643 Presence of artificial hip joint, bilateral: Secondary | ICD-10-CM | POA: Diagnosis not present

## 2022-09-09 DIAGNOSIS — Z87891 Personal history of nicotine dependence: Secondary | ICD-10-CM | POA: Diagnosis not present

## 2022-09-09 DIAGNOSIS — E559 Vitamin D deficiency, unspecified: Secondary | ICD-10-CM | POA: Diagnosis not present

## 2022-09-09 DIAGNOSIS — D509 Iron deficiency anemia, unspecified: Secondary | ICD-10-CM | POA: Diagnosis not present

## 2022-09-09 DIAGNOSIS — M549 Dorsalgia, unspecified: Secondary | ICD-10-CM | POA: Diagnosis not present

## 2022-09-09 DIAGNOSIS — I129 Hypertensive chronic kidney disease with stage 1 through stage 4 chronic kidney disease, or unspecified chronic kidney disease: Secondary | ICD-10-CM | POA: Diagnosis not present

## 2022-09-09 DIAGNOSIS — K449 Diaphragmatic hernia without obstruction or gangrene: Secondary | ICD-10-CM | POA: Diagnosis not present

## 2022-09-09 DIAGNOSIS — J45909 Unspecified asthma, uncomplicated: Secondary | ICD-10-CM | POA: Diagnosis not present

## 2022-09-09 DIAGNOSIS — F32A Depression, unspecified: Secondary | ICD-10-CM | POA: Diagnosis not present

## 2022-09-15 DIAGNOSIS — G8929 Other chronic pain: Secondary | ICD-10-CM | POA: Diagnosis not present

## 2022-09-15 DIAGNOSIS — Z981 Arthrodesis status: Secondary | ICD-10-CM | POA: Diagnosis not present

## 2022-09-15 DIAGNOSIS — Z79891 Long term (current) use of opiate analgesic: Secondary | ICD-10-CM | POA: Diagnosis not present

## 2022-09-15 DIAGNOSIS — E782 Mixed hyperlipidemia: Secondary | ICD-10-CM | POA: Diagnosis not present

## 2022-09-15 DIAGNOSIS — I129 Hypertensive chronic kidney disease with stage 1 through stage 4 chronic kidney disease, or unspecified chronic kidney disease: Secondary | ICD-10-CM | POA: Diagnosis not present

## 2022-09-15 DIAGNOSIS — N200 Calculus of kidney: Secondary | ICD-10-CM | POA: Diagnosis not present

## 2022-09-15 DIAGNOSIS — E559 Vitamin D deficiency, unspecified: Secondary | ICD-10-CM | POA: Diagnosis not present

## 2022-09-15 DIAGNOSIS — F32A Depression, unspecified: Secondary | ICD-10-CM | POA: Diagnosis not present

## 2022-09-15 DIAGNOSIS — D509 Iron deficiency anemia, unspecified: Secondary | ICD-10-CM | POA: Diagnosis not present

## 2022-09-15 DIAGNOSIS — N1831 Chronic kidney disease, stage 3a: Secondary | ICD-10-CM | POA: Diagnosis not present

## 2022-09-15 DIAGNOSIS — Z9181 History of falling: Secondary | ICD-10-CM | POA: Diagnosis not present

## 2022-09-15 DIAGNOSIS — K449 Diaphragmatic hernia without obstruction or gangrene: Secondary | ICD-10-CM | POA: Diagnosis not present

## 2022-09-15 DIAGNOSIS — M549 Dorsalgia, unspecified: Secondary | ICD-10-CM | POA: Diagnosis not present

## 2022-09-15 DIAGNOSIS — J45909 Unspecified asthma, uncomplicated: Secondary | ICD-10-CM | POA: Diagnosis not present

## 2022-09-15 DIAGNOSIS — Z87891 Personal history of nicotine dependence: Secondary | ICD-10-CM | POA: Diagnosis not present

## 2022-09-15 DIAGNOSIS — Z96643 Presence of artificial hip joint, bilateral: Secondary | ICD-10-CM | POA: Diagnosis not present

## 2022-09-15 DIAGNOSIS — E875 Hyperkalemia: Secondary | ICD-10-CM | POA: Diagnosis not present

## 2022-10-29 DIAGNOSIS — R202 Paresthesia of skin: Secondary | ICD-10-CM | POA: Diagnosis not present

## 2022-10-29 DIAGNOSIS — F32A Depression, unspecified: Secondary | ICD-10-CM | POA: Diagnosis not present

## 2022-10-29 DIAGNOSIS — M5416 Radiculopathy, lumbar region: Secondary | ICD-10-CM | POA: Diagnosis not present

## 2022-10-29 DIAGNOSIS — T50905A Adverse effect of unspecified drugs, medicaments and biological substances, initial encounter: Secondary | ICD-10-CM | POA: Diagnosis not present

## 2022-10-29 DIAGNOSIS — J209 Acute bronchitis, unspecified: Secondary | ICD-10-CM | POA: Diagnosis not present

## 2022-10-29 DIAGNOSIS — G894 Chronic pain syndrome: Secondary | ICD-10-CM | POA: Diagnosis not present

## 2022-10-29 DIAGNOSIS — R410 Disorientation, unspecified: Secondary | ICD-10-CM | POA: Diagnosis not present

## 2022-10-29 DIAGNOSIS — R413 Other amnesia: Secondary | ICD-10-CM | POA: Diagnosis not present

## 2022-10-29 DIAGNOSIS — I10 Essential (primary) hypertension: Secondary | ICD-10-CM | POA: Diagnosis not present

## 2022-10-30 ENCOUNTER — Other Ambulatory Visit (HOSPITAL_COMMUNITY): Payer: Self-pay | Admitting: Internal Medicine

## 2022-10-30 ENCOUNTER — Other Ambulatory Visit: Payer: Self-pay | Admitting: Internal Medicine

## 2022-10-30 ENCOUNTER — Other Ambulatory Visit: Payer: Self-pay | Admitting: Student in an Organized Health Care Education/Training Program

## 2022-10-30 DIAGNOSIS — R413 Other amnesia: Secondary | ICD-10-CM

## 2022-11-14 ENCOUNTER — Ambulatory Visit (HOSPITAL_COMMUNITY)
Admission: RE | Admit: 2022-11-14 | Discharge: 2022-11-14 | Disposition: A | Discharge status: Home / Self Care | Payer: Medicare Other | Source: Ambulatory Visit | Attending: Internal Medicine | Admitting: Internal Medicine

## 2022-11-14 DIAGNOSIS — R413 Other amnesia: Secondary | ICD-10-CM | POA: Diagnosis not present

## 2022-11-14 DIAGNOSIS — J3489 Other specified disorders of nose and nasal sinuses: Secondary | ICD-10-CM | POA: Diagnosis not present

## 2022-11-14 DIAGNOSIS — I6782 Cerebral ischemia: Secondary | ICD-10-CM | POA: Diagnosis not present

## 2022-11-14 DIAGNOSIS — I639 Cerebral infarction, unspecified: Secondary | ICD-10-CM | POA: Diagnosis not present

## 2022-12-03 DIAGNOSIS — E538 Deficiency of other specified B group vitamins: Secondary | ICD-10-CM | POA: Diagnosis not present

## 2023-02-03 ENCOUNTER — Emergency Department: Payer: Medicare Other

## 2023-02-03 ENCOUNTER — Emergency Department
Admission: EM | Admit: 2023-02-03 | Discharge: 2023-02-03 | Disposition: A | Discharge status: Home / Self Care | Payer: Medicare Other | Attending: Emergency Medicine | Admitting: Emergency Medicine

## 2023-02-03 DIAGNOSIS — W06XXXA Fall from bed, initial encounter: Secondary | ICD-10-CM | POA: Diagnosis not present

## 2023-02-03 DIAGNOSIS — S20211A Contusion of right front wall of thorax, initial encounter: Secondary | ICD-10-CM | POA: Diagnosis not present

## 2023-02-03 DIAGNOSIS — R079 Chest pain, unspecified: Secondary | ICD-10-CM | POA: Diagnosis not present

## 2023-02-03 DIAGNOSIS — R0789 Other chest pain: Secondary | ICD-10-CM | POA: Diagnosis not present

## 2023-02-03 DIAGNOSIS — Z743 Need for continuous supervision: Secondary | ICD-10-CM | POA: Diagnosis not present

## 2023-02-03 DIAGNOSIS — R6889 Other general symptoms and signs: Secondary | ICD-10-CM | POA: Diagnosis not present

## 2023-02-03 LAB — CBC
HCT: 38.2 % (ref 36.0–46.0)
Hemoglobin: 12.2 g/dL (ref 12.0–15.0)
MCH: 28.7 pg (ref 26.0–34.0)
MCHC: 31.9 g/dL (ref 30.0–36.0)
MCV: 89.9 fL (ref 80.0–100.0)
Platelets: 199 10*3/uL (ref 150–400)
RBC: 4.25 MIL/uL (ref 3.87–5.11)
RDW: 12.9 % (ref 11.5–15.5)
WBC: 4.1 10*3/uL (ref 4.0–10.5)
nRBC: 0 % (ref 0.0–0.2)

## 2023-02-03 LAB — COMPREHENSIVE METABOLIC PANEL
ALT: 13 U/L (ref 0–44)
AST: 21 U/L (ref 15–41)
Albumin: 3.7 g/dL (ref 3.5–5.0)
Alkaline Phosphatase: 65 U/L (ref 38–126)
Anion gap: 11 (ref 5–15)
BUN: 34 mg/dL — ABNORMAL HIGH (ref 8–23)
CO2: 27 mmol/L (ref 22–32)
Calcium: 9.4 mg/dL (ref 8.9–10.3)
Chloride: 102 mmol/L (ref 98–111)
Creatinine, Ser: 1.25 mg/dL — ABNORMAL HIGH (ref 0.44–1.00)
GFR, Estimated: 43 mL/min — ABNORMAL LOW (ref >60)
Glucose, Bld: 78 mg/dL (ref 70–99)
Potassium: 4.2 mmol/L (ref 3.5–5.1)
Sodium: 140 mmol/L (ref 135–145)
Total Bilirubin: 0.8 mg/dL (ref 0.3–1.2)
Total Protein: 6.7 g/dL (ref 6.5–8.1)

## 2023-02-03 LAB — TROPONIN I (HIGH SENSITIVITY): Troponin I (High Sensitivity): 13 ng/L (ref <18)

## 2023-02-03 MED ORDER — TRAMADOL HCL 50 MG PO TABS: 50.0000 mg | ORAL_TABLET | Freq: Four times a day (QID) | ORAL | 0 refills | Status: DC | PRN

## 2023-02-03 MED ORDER — TRAMADOL HCL 50 MG PO TABS
50.0000 mg | ORAL_TABLET | Freq: Once | ORAL | Status: AC
  Administered 2023-02-03: 50 mg via ORAL
  Filled 2023-02-03: qty 1

## 2023-02-03 NOTE — ED Triage Notes (Signed)
Pt presents to the ED via [facility] from home. Pt fell out of bed about 1 week ago hitting her chest against her night stand. Pt states that she has had pain in her chest since. Pain is worse with movement and inspiration.

## 2023-02-03 NOTE — ED Provider Notes (Signed)
[facility] Provider Note    Event Date/Time   First MD Initiated Contact with Patient 02/03/23 1551     (approximate)   History   Fall and Chest Pain   HPI  Amanda [name] is a 82 y.o. female with past medical history of chronic pain syndrome, kidney injury, osteoarthritis who presents with complaints of chest pain.  Patient reports she fell out of bed 1 week ago and injured her chest, she has some bruising in the area.  She reports it is continued to hurt and is actually worsened over the last week.  Worse with deep inspiration     Physical Exam   Triage Vital Signs: ED Triage Vitals  Enc Vitals Group     BP 02/03/23 1551 129/81     Pulse Rate 02/03/23 1551 70     Resp 02/03/23 1551 18     Temp 02/03/23 1551 98.6 F (37 C)     Temp Source 02/03/23 1551 Oral     SpO2 02/03/23 1551 96 %     Weight 02/03/23 1553 77.1 kg (170 lb)     Height 02/03/23 1553 1.588 m (5' 2.5")     Head Circumference --      Peak Flow --      Pain Score 02/03/23 1553 9     Pain Loc --      Pain Edu? --      Excl. in [place]? --     Most recent vital signs: Vitals:   02/03/23 1551 02/03/23 1700  BP: 129/81 132/82  Pulse: 70 70  Resp: 18 18  Temp: 98.6 F (37 C) 98.4 F (36.9 C)  SpO2: 96% 98%     General: Awake, no distress.  CV:  Good peripheral perfusion.  Patient has a bruise on the right side of her chest just lateral to the sternum which is where she complains of pain Resp:  Normal effort.  Abd:  No distention.  Other:  No extremity injuries noted   ED Results / Procedures / Treatments   Labs (all labs ordered are listed, but only abnormal results are displayed) Labs Reviewed  COMPREHENSIVE METABOLIC PANEL - Abnormal; Notable for the following components:      Result Value   BUN 34 (*)    Creatinine, Ser 1.25 (*)    GFR, Estimated 43 (*)    All other components within normal limits  CBC  TROPONIN I (HIGH SENSITIVITY)     EKG  ED ECG  REPORT I, Lavonia Drafts, the attending physician, personally viewed and interpreted this ECG.  Date: 02/03/2023  Rhythm: normal sinus rhythm QRS Axis: normal Intervals: normal ST/T Wave abnormalities: normal Narrative Interpretation: no evidence of acute ischemia    RADIOLOGY Chest x-ray viewed interpreted by me, no acute Hilda Blades    PROCEDURES:  Critical Care performed:   Procedures   MEDICATIONS ORDERED IN ED: Medications  traMADol (ULTRAM) tablet 50 mg (50 mg Oral Given 02/03/23 1709)     IMPRESSION / MDM / ASSESSMENT AND PLAN / ED COURSE  I reviewed the triage vital signs and the nursing notes. Patient's presentation is most consistent with acute presentation with potential threat to life or bodily function.  Patient with multiple comorbidities presents with complaints of chest pain.  Differential includes chest contusion, sternal fracture, rib fracture, also possible is ACS given that her symptoms have worsened over the last several days, doubt PE, no tachycardia, no hemoptysis, no calf pain or swelling  Will obtain labs, chest x-ray 2 view, EKG is reassuring.  Chest x-ray is unremarkable, lab work is reassuring, no significant change in high sensitive troponin from prior levels  Presentation is most consistent with chest wall pain given bruising over the area and point tenderness in that location no evidence of fracture of the sternum or rib fracture, recommend supportive care, analgesics, outpatient follow-up        FINAL CLINICAL IMPRESSION(S) / ED DIAGNOSES   Final diagnoses:  Chest wall pain     Rx / DC Orders   ED Discharge Orders          Ordered    traMADol (ULTRAM) 50 MG tablet  Every 6 hours PRN        02/03/23 1706             Note:  This document was prepared using Dragon voice recognition software and may include unintentional dictation errors.   Lavonia Drafts, MD 02/03/23 1750

## 2023-02-05 ENCOUNTER — Telehealth: Payer: Self-pay

## 2023-02-05 NOTE — Telephone Encounter (Signed)
     Patient  visit on 3/26  at [facility]   Have you been able to follow up with your primary care physician?yes  The patient was or was not able to obtain any needed medicine or equipment. Yes   Are there diet recommendations that you are having difficulty following? Na   Patient expresses understanding of discharge instructions and education provided has no other needs at this time.  Yes      [place] [phone] [address], [place],  [address] Phone: [phone] Email: [email or web address]

## 2023-03-19 DIAGNOSIS — M810 Age-related osteoporosis without current pathological fracture: Secondary | ICD-10-CM | POA: Diagnosis not present

## 2023-03-26 DIAGNOSIS — R413 Other amnesia: Secondary | ICD-10-CM | POA: Diagnosis not present

## 2023-03-26 DIAGNOSIS — M810 Age-related osteoporosis without current pathological fracture: Secondary | ICD-10-CM | POA: Diagnosis not present

## 2023-03-26 DIAGNOSIS — E559 Vitamin D deficiency, unspecified: Secondary | ICD-10-CM | POA: Diagnosis not present

## 2023-03-26 DIAGNOSIS — Z8673 Personal history of transient ischemic attack (TIA), and cerebral infarction without residual deficits: Secondary | ICD-10-CM | POA: Diagnosis not present

## 2023-03-26 DIAGNOSIS — N1832 Chronic kidney disease, stage 3b: Secondary | ICD-10-CM | POA: Diagnosis not present

## 2023-04-01 DIAGNOSIS — Z8673 Personal history of transient ischemic attack (TIA), and cerebral infarction without residual deficits: Secondary | ICD-10-CM | POA: Diagnosis not present

## 2023-04-01 DIAGNOSIS — M810 Age-related osteoporosis without current pathological fracture: Secondary | ICD-10-CM | POA: Diagnosis not present

## 2023-04-01 DIAGNOSIS — R413 Other amnesia: Secondary | ICD-10-CM | POA: Diagnosis not present

## 2023-04-01 DIAGNOSIS — G479 Sleep disorder, unspecified: Secondary | ICD-10-CM | POA: Diagnosis not present

## 2023-06-02 DIAGNOSIS — Z1389 Encounter for screening for other disorder: Secondary | ICD-10-CM | POA: Diagnosis not present

## 2023-06-02 DIAGNOSIS — Z0000 Encounter for general adult medical examination without abnormal findings: Secondary | ICD-10-CM | POA: Diagnosis not present

## 2023-06-02 DIAGNOSIS — F03A0 Unspecified dementia, mild, without behavioral disturbance, psychotic disturbance, mood disturbance, and anxiety: Secondary | ICD-10-CM | POA: Diagnosis not present

## 2023-06-02 DIAGNOSIS — H9193 Unspecified hearing loss, bilateral: Secondary | ICD-10-CM | POA: Diagnosis not present

## 2023-06-02 DIAGNOSIS — R413 Other amnesia: Secondary | ICD-10-CM | POA: Diagnosis not present

## 2023-06-02 DIAGNOSIS — R8290 Unspecified abnormal findings in urine: Secondary | ICD-10-CM | POA: Diagnosis not present

## 2023-06-02 DIAGNOSIS — G894 Chronic pain syndrome: Secondary | ICD-10-CM | POA: Diagnosis not present

## 2023-06-02 DIAGNOSIS — I10 Essential (primary) hypertension: Secondary | ICD-10-CM | POA: Diagnosis not present

## 2023-06-02 DIAGNOSIS — F03B0 Unspecified dementia, moderate, without behavioral disturbance, psychotic disturbance, mood disturbance, and anxiety: Secondary | ICD-10-CM | POA: Diagnosis not present

## 2023-06-02 DIAGNOSIS — Z8673 Personal history of transient ischemic attack (TIA), and cerebral infarction without residual deficits: Secondary | ICD-10-CM | POA: Diagnosis not present

## 2023-06-02 DIAGNOSIS — E538 Deficiency of other specified B group vitamins: Secondary | ICD-10-CM | POA: Diagnosis not present

## 2023-06-23 DIAGNOSIS — E7800 Pure hypercholesterolemia, unspecified: Secondary | ICD-10-CM | POA: Diagnosis not present

## 2023-06-23 DIAGNOSIS — I10 Essential (primary) hypertension: Secondary | ICD-10-CM | POA: Diagnosis not present

## 2023-06-23 DIAGNOSIS — M810 Age-related osteoporosis without current pathological fracture: Secondary | ICD-10-CM | POA: Diagnosis not present

## 2023-09-03 ENCOUNTER — Other Ambulatory Visit: Payer: Self-pay

## 2023-09-03 ENCOUNTER — Encounter: Payer: Self-pay | Admitting: Emergency Medicine

## 2023-09-03 ENCOUNTER — Emergency Department: Payer: Medicare Other

## 2023-09-03 ENCOUNTER — Observation Stay
Admission: EM | Admit: 2023-09-03 | Discharge: 2023-09-04 | Disposition: A | Discharge status: Home / Self Care | Payer: Medicare Other | Attending: Internal Medicine | Admitting: Internal Medicine

## 2023-09-03 DIAGNOSIS — N201 Calculus of ureter: Secondary | ICD-10-CM | POA: Diagnosis not present

## 2023-09-03 DIAGNOSIS — R1084 Generalized abdominal pain: Principal | ICD-10-CM

## 2023-09-03 DIAGNOSIS — Z743 Need for continuous supervision: Secondary | ICD-10-CM | POA: Diagnosis not present

## 2023-09-03 DIAGNOSIS — N202 Calculus of kidney with calculus of ureter: Secondary | ICD-10-CM | POA: Diagnosis not present

## 2023-09-03 DIAGNOSIS — Z96643 Presence of artificial hip joint, bilateral: Secondary | ICD-10-CM | POA: Insufficient documentation

## 2023-09-03 DIAGNOSIS — Z79899 Other long term (current) drug therapy: Secondary | ICD-10-CM | POA: Diagnosis not present

## 2023-09-03 DIAGNOSIS — G309 Alzheimer's disease, unspecified: Secondary | ICD-10-CM | POA: Insufficient documentation

## 2023-09-03 DIAGNOSIS — N200 Calculus of kidney: Secondary | ICD-10-CM | POA: Diagnosis not present

## 2023-09-03 DIAGNOSIS — N1832 Chronic kidney disease, stage 3b: Secondary | ICD-10-CM | POA: Diagnosis not present

## 2023-09-03 DIAGNOSIS — N1330 Unspecified hydronephrosis: Secondary | ICD-10-CM

## 2023-09-03 DIAGNOSIS — J45909 Unspecified asthma, uncomplicated: Secondary | ICD-10-CM | POA: Diagnosis not present

## 2023-09-03 DIAGNOSIS — Z87891 Personal history of nicotine dependence: Secondary | ICD-10-CM | POA: Insufficient documentation

## 2023-09-03 DIAGNOSIS — R197 Diarrhea, unspecified: Secondary | ICD-10-CM | POA: Diagnosis present

## 2023-09-03 DIAGNOSIS — R1111 Vomiting without nausea: Secondary | ICD-10-CM | POA: Diagnosis not present

## 2023-09-03 DIAGNOSIS — R531 Weakness: Secondary | ICD-10-CM | POA: Diagnosis not present

## 2023-09-03 DIAGNOSIS — R7303 Prediabetes: Secondary | ICD-10-CM | POA: Insufficient documentation

## 2023-09-03 DIAGNOSIS — F0390 Unspecified dementia without behavioral disturbance: Secondary | ICD-10-CM | POA: Diagnosis not present

## 2023-09-03 DIAGNOSIS — I129 Hypertensive chronic kidney disease with stage 1 through stage 4 chronic kidney disease, or unspecified chronic kidney disease: Secondary | ICD-10-CM | POA: Diagnosis not present

## 2023-09-03 DIAGNOSIS — I10 Essential (primary) hypertension: Secondary | ICD-10-CM | POA: Diagnosis present

## 2023-09-03 DIAGNOSIS — K5730 Diverticulosis of large intestine without perforation or abscess without bleeding: Secondary | ICD-10-CM | POA: Diagnosis not present

## 2023-09-03 DIAGNOSIS — R109 Unspecified abdominal pain: Secondary | ICD-10-CM | POA: Diagnosis not present

## 2023-09-03 DIAGNOSIS — R6889 Other general symptoms and signs: Secondary | ICD-10-CM | POA: Diagnosis not present

## 2023-09-03 DIAGNOSIS — G894 Chronic pain syndrome: Secondary | ICD-10-CM | POA: Diagnosis present

## 2023-09-03 LAB — URINALYSIS, ROUTINE W REFLEX MICROSCOPIC
Bacteria, UA: NONE SEEN
Bilirubin Urine: NEGATIVE
Bilirubin Urine: NEGATIVE
Glucose, UA: NEGATIVE mg/dL
Glucose, UA: NEGATIVE mg/dL
Ketones, ur: NEGATIVE mg/dL
Ketones, ur: NEGATIVE mg/dL
Leukocytes,Ua: NEGATIVE
Nitrite: NEGATIVE
Nitrite: NEGATIVE
Protein, ur: NEGATIVE mg/dL
Protein, ur: NEGATIVE mg/dL
RBC / HPF: >50 RBC/hpf (ref 0–5)
RBC / HPF: >50 RBC/hpf (ref 0–5)
Specific Gravity, Urine: 1.011 (ref 1.005–1.030)
Specific Gravity, Urine: 1.017 (ref 1.005–1.030)
WBC, UA: >50 WBC/hpf (ref 0–5)
pH: 5 (ref 5.0–8.0)
pH: 5 (ref 5.0–8.0)

## 2023-09-03 LAB — COMPREHENSIVE METABOLIC PANEL
ALT: 27 U/L (ref 0–44)
AST: 26 U/L (ref 15–41)
Albumin: 3.7 g/dL (ref 3.5–5.0)
Alkaline Phosphatase: 66 U/L (ref 38–126)
Anion gap: 8 (ref 5–15)
BUN: 31 mg/dL — ABNORMAL HIGH (ref 8–23)
CO2: 24 mmol/L (ref 22–32)
Calcium: 9.3 mg/dL (ref 8.9–10.3)
Chloride: 107 mmol/L (ref 98–111)
Creatinine, Ser: 1.4 mg/dL — ABNORMAL HIGH (ref 0.44–1.00)
GFR, Estimated: 37 mL/min — ABNORMAL LOW (ref >60)
Glucose, Bld: 106 mg/dL — ABNORMAL HIGH (ref 70–99)
Potassium: 5 mmol/L (ref 3.5–5.1)
Sodium: 139 mmol/L (ref 135–145)
Total Bilirubin: 0.8 mg/dL (ref 0.3–1.2)
Total Protein: 6.7 g/dL (ref 6.5–8.1)

## 2023-09-03 LAB — CBC
HCT: 38.5 % (ref 36.0–46.0)
Hemoglobin: 12.3 g/dL (ref 12.0–15.0)
MCH: 30.3 pg (ref 26.0–34.0)
MCHC: 31.9 g/dL (ref 30.0–36.0)
MCV: 94.8 fL (ref 80.0–100.0)
Platelets: 195 10*3/uL (ref 150–400)
RBC: 4.06 MIL/uL (ref 3.87–5.11)
RDW: 15.2 % (ref 11.5–15.5)
WBC: 7.4 10*3/uL (ref 4.0–10.5)
nRBC: 0 % (ref 0.0–0.2)

## 2023-09-03 LAB — LIPASE, BLOOD: Lipase: 32 U/L (ref 11–51)

## 2023-09-03 MED ORDER — ATORVASTATIN CALCIUM 20 MG PO TABS
20.0000 mg | ORAL_TABLET | Freq: Every day | ORAL | Status: DC
  Administered 2023-09-03: 20 mg via ORAL
  Filled 2023-09-03: qty 1

## 2023-09-03 MED ORDER — ONDANSETRON HCL 4 MG/2ML IJ SOLN
4.0000 mg | Freq: Once | INTRAMUSCULAR | Status: AC
  Administered 2023-09-03: 4 mg via INTRAVENOUS
  Filled 2023-09-03: qty 2

## 2023-09-03 MED ORDER — ACETAMINOPHEN 325 MG PO TABS
650.0000 mg | ORAL_TABLET | Freq: Four times a day (QID) | ORAL | Status: DC | PRN
  Administered 2023-09-03: 650 mg via ORAL
  Filled 2023-09-03: qty 2

## 2023-09-03 MED ORDER — MORPHINE SULFATE (PF) 2 MG/ML IV SOLN
2.0000 mg | Freq: Once | INTRAVENOUS | Status: AC
  Administered 2023-09-03: 2 mg via INTRAVENOUS
  Filled 2023-09-03: qty 1

## 2023-09-03 MED ORDER — MEMANTINE HCL 5 MG PO TABS
10.0000 mg | ORAL_TABLET | Freq: Two times a day (BID) | ORAL | Status: DC
  Administered 2023-09-03: 10 mg via ORAL
  Filled 2023-09-03: qty 2

## 2023-09-03 MED ORDER — ONDANSETRON HCL 4 MG/2ML IJ SOLN: 4.0000 mg | Freq: Four times a day (QID) | INTRAMUSCULAR | Status: DC | PRN

## 2023-09-03 MED ORDER — FENTANYL CITRATE PF 50 MCG/ML IJ SOSY
50.0000 ug | PREFILLED_SYRINGE | Freq: Once | INTRAMUSCULAR | Status: AC
  Administered 2023-09-03: 50 ug via INTRAVENOUS
  Filled 2023-09-03: qty 1

## 2023-09-03 MED ORDER — ONDANSETRON HCL 4 MG PO TABS: 4.0000 mg | ORAL_TABLET | Freq: Four times a day (QID) | ORAL | Status: DC | PRN

## 2023-09-03 MED ORDER — GABAPENTIN 300 MG PO CAPS
300.0000 mg | ORAL_CAPSULE | Freq: Two times a day (BID) | ORAL | Status: DC
  Administered 2023-09-03: 300 mg via ORAL
  Filled 2023-09-03: qty 1

## 2023-09-03 MED ORDER — SODIUM CHLORIDE 0.9 % IV SOLN
2.0000 g | Freq: Once | INTRAVENOUS | Status: AC
  Administered 2023-09-03: 2 g via INTRAVENOUS
  Filled 2023-09-03: qty 20

## 2023-09-03 MED ORDER — OXYCODONE HCL 5 MG PO TABS
5.0000 mg | ORAL_TABLET | Freq: Four times a day (QID) | ORAL | Status: DC | PRN
Start: 2023-09-03 — End: 2023-09-03

## 2023-09-03 MED ORDER — IOHEXOL 300 MG/ML  SOLN
80.0000 mL | Freq: Once | INTRAMUSCULAR | Status: AC | PRN
  Administered 2023-09-03: 80 mL via INTRAVENOUS

## 2023-09-03 MED ORDER — VITAMIN B-12 1000 MCG PO TABS: 1000.0000 ug | ORAL_TABLET | Freq: Every day | ORAL | Status: DC

## 2023-09-03 MED ORDER — ASPIRIN 81 MG PO TBEC: 81.0000 mg | DELAYED_RELEASE_TABLET | Freq: Every day | ORAL | Status: DC

## 2023-09-03 MED ORDER — ALBUTEROL SULFATE (2.5 MG/3ML) 0.083% IN NEBU: 2.5000 mg | INHALATION_SOLUTION | Freq: Four times a day (QID) | RESPIRATORY_TRACT | Status: DC | PRN

## 2023-09-03 MED ORDER — LACTATED RINGERS IV BOLUS
1000.0000 mL | Freq: Once | INTRAVENOUS | Status: AC
  Administered 2023-09-03: 1000 mL via INTRAVENOUS

## 2023-09-03 MED ORDER — DONEPEZIL HCL 5 MG PO TABS
5.0000 mg | ORAL_TABLET | Freq: Every day | ORAL | Status: DC
  Administered 2023-09-03: 5 mg via ORAL
  Filled 2023-09-03: qty 1

## 2023-09-03 MED ORDER — CITALOPRAM HYDROBROMIDE 20 MG PO TABS: 10.0000 mg | ORAL_TABLET | Freq: Every day | ORAL | Status: DC

## 2023-09-03 MED ORDER — ACETAMINOPHEN 650 MG RE SUPP: 650.0000 mg | Freq: Four times a day (QID) | RECTAL | Status: DC | PRN

## 2023-09-03 MED ORDER — TRAZODONE HCL 50 MG PO TABS
50.0000 mg | ORAL_TABLET | Freq: Once | ORAL | Status: AC
  Administered 2023-09-03: 50 mg via ORAL
  Filled 2023-09-03: qty 1

## 2023-09-03 MED ORDER — DULOXETINE HCL 30 MG PO CPEP
30.0000 mg | ORAL_CAPSULE | Freq: Two times a day (BID) | ORAL | Status: DC
  Administered 2023-09-03: 30 mg via ORAL
  Filled 2023-09-03: qty 1

## 2023-09-03 MED ORDER — CARVEDILOL 6.25 MG PO TABS
6.2500 mg | ORAL_TABLET | Freq: Two times a day (BID) | ORAL | Status: DC
  Administered 2023-09-04: 6.25 mg via ORAL
  Filled 2023-09-03: qty 1

## 2023-09-03 MED ORDER — SODIUM CHLORIDE 0.9% FLUSH
3.0000 mL | Freq: Two times a day (BID) | INTRAVENOUS | Status: DC
  Administered 2023-09-03 (×2): 3 mL via INTRAVENOUS

## 2023-09-03 MED ORDER — OXYCODONE HCL 5 MG PO TABS
5.0000 mg | ORAL_TABLET | Freq: Four times a day (QID) | ORAL | Status: DC | PRN
  Administered 2023-09-04: 10 mg via ORAL
  Filled 2023-09-03: qty 2

## 2023-09-03 NOTE — Consult Note (Addendum)
Urology Consult  I have been asked to see the patient by Dr. Erma Heritage, for evaluation and management of right ureteral stone.  Chief Complaint: Lower abdominal pain, diarrhea  History of Present Illness: Amanda [name] is a 82 y.o. year old female who presented to the ED this morning with reports of lower abdominal pain and loose stool since last night.  CTAP with contrast was notable for a 10 mm proximal right ureteral stone with moderate upstream hydroureteronephrosis.  Admission labs notable for creatinine 1.40 (this line 1.2-1.3); white count 7.4; and UA with >50 RBC/hpf, >50 WBC/hpf, rare bacteria, and 21-50 squamous epithelial cells/hpf.  She was given antibiotics as below.  UA was repeated, this time significantly improved with >50 RBC/hpf, 6-10 WBC/hpf, no bacteria, and 0-5 squamous epithelial cells/hpf.  She denies a history of stone episodes.  Stone was previously seen on CT stone study dated 05/20/2022, at that time nonobstructing in the right lower pole.  Anti-infectives (From admission, onward)    Start     Dose/Rate Route Frequency Ordered Stop   09/03/23 1200  cefTRIAXone (ROCEPHIN) 2 g in sodium chloride 0.9 % 100 mL IVPB        2 g 200 mL/hr over 30 Minutes Intravenous  Once 09/03/23 1149 09/03/23 1300       Past Medical History:  Diagnosis Date   Anxiety    Asthma    Complication of anesthesia    difficulty waking up from anesthesia   GI bleed    Hiatal hernia    Hypertension    IDA (iron deficiency anemia)     Past Surgical History:  Procedure Laterality Date   ABDOMINAL HYSTERECTOMY     BACK SURGERY      lumbar fusion   BILATERAL CARPAL TUNNEL RELEASE     BREAST BIOPSY Left 01/17/2002   collapsed  lung Left    COLONOSCOPY  04/28/2012   COLONOSCOPY  02-12-15   Dr Lemar Livings   ESOPHAGEAL MANOMETRY N/A 06/13/2015   Procedure: ESOPHAGEAL MANOMETRY (EM);  Surgeon: Elnita Maxwell, MD;  Location: [facility] ENDOSCOPY;  Service: Endoscopy;  Laterality: N/A;    ESOPHAGUS SURGERY  Oct 2016   Dr Alva Garnet in [place]   KNEE ARTHROSCOPY Left    PARAESOPHAGEAL HERNIA REPAIR  07-26-15   Dr Alva Garnet   PARATHYROIDECTOMY Left    one gland on left removed   TOTAL HIP ARTHROPLASTY Left 05/12/2017   Procedure: TOTAL HIP ARTHROPLASTY ANTERIOR APPROACH;  Surgeon: Kennedy Bucker, MD;  Location: [facility] ORS;  Service: Orthopedics;  Laterality: Left;   TOTAL HIP ARTHROPLASTY Right 03/04/2018   Procedure: TOTAL HIP ARTHROPLASTY ANTERIOR APPROACH;  Surgeon: Kennedy Bucker, MD;  Location: [facility] ORS;  Service: Orthopedics;  Laterality: Right;   UPPER GI ENDOSCOPY  04/28/2012    Home Medications:  No outpatient medications have been marked as taking for the 09/03/23 encounter [facility] Encounter).    Allergies:  Allergies  Allergen Reactions   Other Other (See Comments)    general anesthesia=difficulty waking   Codeine Nausea And Vomiting   Penicillins Hives    Has patient had a PCN reaction causing immediate rash, facial/tongue/throat swelling, SOB or lightheadedness with hypotension: Yes Has patient had a PCN reaction causing severe rash involving mucus membranes or skin necrosis: Unknown Has patient had a PCN reaction that required hospitalization: No Has patient had a PCN reaction occurring within the last 10 years: No If all of the above answers are "NO", then may proceed with Cephalosporin use.  History reviewed. No pertinent family history.  Social History:  reports that she has quit smoking. Her smoking use included cigarettes. She has a 30 pack-year smoking history. She has never used smokeless tobacco. She reports that she does not drink alcohol and does not use drugs.  ROS: A complete review of systems was performed.  All systems are negative except for pertinent findings as noted.  Physical Exam:  Vital signs in last 24 hours: Temp:  [97.8 F (36.6 C)-98.3 F (36.8 C)] 97.8 F (36.6 C) (10/24 1146) Pulse Rate:  [62-74] 65 (10/24 1330) Resp:  [12-18]  16 (10/24 1330) BP: (110-139)/(60-78) 139/76 (10/24 1330) SpO2:  [96 %-100 %] 100 % (10/24 1330) Weight:  [68.5 kg] 68.5 kg (10/24 0746) Constitutional:  Alert and oriented, no acute distress HEENT: [place] AT, moist mucus membranes Cardiovascular: No clubbing, cyanosis, or edema Respiratory: Normal respiratory effort Skin: No rashes, bruises or suspicious lesions Neurologic: Grossly intact, no focal deficits, moving all 4 extremities Psychiatric: Normal mood and affect  Laboratory Data:  Recent Labs    09/03/23 0750  WBC 7.4  HGB 12.3  HCT 38.5   Recent Labs    09/03/23 0750  NA 139  K 5.0  CL 107  CO2 24  GLUCOSE 106*  BUN 31*  CREATININE 1.40*  CALCIUM 9.3   Urinalysis    Component Value Date/Time   COLORURINE STRAW (A) 09/03/2023 1354   APPEARANCEUR CLEAR (A) 09/03/2023 1354   APPEARANCEUR Clear 02/09/2015 1254   LABSPEC 1.017 09/03/2023 1354   LABSPEC 1.011 02/09/2015 1254   PHURINE 5.0 09/03/2023 1354   GLUCOSEU NEGATIVE 09/03/2023 1354   GLUCOSEU Negative 02/09/2015 1254   HGBUR LARGE (A) 09/03/2023 1354   BILIRUBINUR NEGATIVE 09/03/2023 1354   BILIRUBINUR Negative 02/09/2015 1254   KETONESUR NEGATIVE 09/03/2023 1354   PROTEINUR NEGATIVE 09/03/2023 1354   NITRITE NEGATIVE 09/03/2023 1354   LEUKOCYTESUR NEGATIVE 09/03/2023 1354   LEUKOCYTESUR Trace 02/09/2015 1254   Results for orders placed or performed during the hospital encounter of 02/18/18  Surgical pcr screen     Status: None   Collection Time: 02/18/18 12:01 PM   Specimen: Nasal Mucosa; Nasal Swab  Result Value Ref Range Status   MRSA, PCR NEGATIVE NEGATIVE Final   Staphylococcus aureus NEGATIVE NEGATIVE Final    Comment: (NOTE) The Xpert SA Assay (FDA approved for NASAL specimens in patients 11 years of age and older), is one component of a comprehensive surveillance program. It is not intended to diagnose infection nor to guide or monitor treatment. Performed at [facility], [address], [place], [place] [address]     Radiologic Imaging: CT ABDOMEN PELVIS W CONTRAST  Result Date: 09/03/2023 CLINICAL DATA:  Abdominal pain, acute, nonlocalized. EXAM: CT ABDOMEN AND PELVIS WITH CONTRAST TECHNIQUE: Multidetector CT imaging of the abdomen and pelvis was performed using the standard protocol following bolus administration of intravenous contrast. RADIATION DOSE REDUCTION: This exam was performed according to the departmental dose-optimization program which includes automated exposure control, adjustment of the mA and/or kV according to patient size and/or use of iterative reconstruction technique. CONTRAST:  80mL OMNIPAQUE IOHEXOL 300 MG/ML  SOLN COMPARISON:  CT abdomen/pelvis dated May 20, 2022 FINDINGS: Lower chest: No acute abnormality. There is a 5 mm posterior right lower lobe nodule and a 4 mm left lower lobe nodule (series 4, images 6 and 8), which are not significantly changed in size since the prior exam dated February 09, 2015, favoring a benign etiology and  requiring no further follow-up. Bibasilar linear atelectasis or scarring. Hepatobiliary: No suspicious focal hepatic lesion. Stable subcentimeter focal hypodensity within the inferior right hepatic lobe, too small to definitively characterize. No gallstones. No gallbladder wall thickening. No biliary dilatation. Pancreas: Unremarkable. No pancreatic ductal dilatation or surrounding inflammatory changes. Spleen: Normal in size without focal abnormality. Adrenals/Urinary Tract: Stable 1.8 cm right adrenal lesion, previously characterized as an adrenal adenoma. Kidneys enhance symmetrically. The left adrenal gland is unremarkable. There is a 10 mm calculus in the proximal right ureter with moderate upstream hydroureteronephrosis. No left-sided renal calculi or hydronephrosis. The bladder is grossly unremarkable although evaluation is limited due to streak artifact from surrounding orthopedic hardware. Stomach/Bowel:  Postoperative changes related to prior partial gastric resection and hiatal hernia repair. Appendix appears normal. No evidence of bowel wall thickening, distention, or focal inflammatory changes. Colonic diverticulosis without evidence of acute diverticulitis. Vascular/Lymphatic: Aortic atherosclerosis. No enlarged abdominal or pelvic lymph nodes. Reproductive: Status post hysterectomy. No adnexal masses. Other: No intra-abdominal free fluid or free air. Musculoskeletal: Status post bilateral total hip arthroplasties. Multilevel degenerative changes of the spine, most pronounced at L4-L5. Redemonstration moderate to severe spinal canal at L2-L3. IMPRESSION: 1. 10 mm calculus in the proximal right ureter with moderate upstream hydroureteronephrosis. 2.  Aortic Atherosclerosis (ICD10-I70.0). 3. Additional unchanged ancillary findings as described above. Electronically Signed   By: Hart Robinsons M.D.   On: 09/03/2023 11:21    Assessment & Plan:  82 year old female admitted with a 10 mm proximal right ureteral stone.  Initial UA was suspicious for UTI, though contaminated.  Repeat UA collected after Rocephin was significantly improved.  Overall, low suspicion for infection and no signs of sepsis.  Lower abdominal pain and diarrhea possibly secondary to ureteral stone, though cannot rule out GI source.  I offered her right ureteroscopy with laser lithotripsy and stent placement with Dr. Richardo Hanks tomorrow.  We discussed that if there are any intraoperative signs of infection, we will not perform a ureteroscopy and only place a ureteral stent to relieve urinary obstruction and allow for source control of her infection.  In that case, she would need outpatient ureteroscopy 2 to 3 weeks thereafter, after completion of culture appropriate antibiotics.  She expressed understanding and wishes to proceed.  Recommendations: -N.p.o. at midnight (ordered) -Supportive care and pain medication per primary team -Right  ureteroscopy with laser lithotripsy and stent placement with Dr. Richardo Hanks tomorrow (informed consent ordered)  Thank you for involving me in this patient's care, I will continue to follow along.  Carman Ching, PA-C 09/03/2023 2:10 PM

## 2023-09-03 NOTE — Assessment & Plan Note (Addendum)
Patient had a large kidney stone with hydroureteronephrosis seen on CT scan.  Patient was brought to the operating room on 10/25 by Dr. Richardo Hanks for stent placement and laser lithotripsy of the stone.  They will call her for follow-up for stent removal in about 2 weeks.

## 2023-09-03 NOTE — H&P (Signed)
History and Physical    [name] NWG:[id number] DOB: Jul 15, 1941 DOA: 09/03/2023 DOS: the patient was seen and examined on 09/03/2023 PCP: Enid Baas, MD  Patient coming from: Home  Chief Complaint:  Chief Complaint  Patient presents with   Abdominal Pain   Diarrhea   HPI: Amanda [name] is a 82 y.o. female with medical history significant of mixed Alzheimer's and vascular dementia, CKD stage IIIb, CVA, chronic pain syndrome, chronic nephrolithiasis, hypertension, hyperlipidemia, osteoporosis, B12 deficiency, who presents to the ED due to abdominal pain and diarrhea.  Amanda [name] states that for the last 3-4 months, she has been experiencing daily diarrhea with at least 4 episodes per day.  Her stool is dark brown in color and generally watery; she has not noticed any bright red or steatorrhea.  During this time, she has been experiencing some increased generalized weakness but is still able to walk around her home with her cane.  She denies any focal weakness.  She denies any bowel incontinence, fever, chills.  Over the last 1 day, she has experiencing generalized abdominal pain that is intermittent.  She also endorses increased urinary frequency but denies any dysuria or urgency.  Urinary frequency has been ongoing for several months as well.  Amanda [name] states she cannot recall being told she had dementia.   ED course: On arrival to the ED, patient was hypertensive at 135/77 with heart rate of 74.  She was saturating at 96% on room air.  She was afebrile at 98.3.Initial workup notable for normal CBC, glucose of 106, BUN 31, creatinine 1.40 with GFR 37.CT of the abdomen was obtained that demonstrated a large 10 mm right renal stone with moderate upstream hydroureteronephrosis.  No stomach or bowel findings.  Patient started on morphine, fentanyl, Rocephin, and IV fluids.  Urology consulted.  [facility] contacted for admission.  Review of Systems: As mentioned in the history  of present illness. All other systems reviewed and are negative.  Past Medical History:  Diagnosis Date   Anxiety    Asthma    Complication of anesthesia    difficulty waking up from anesthesia   GI bleed    Hiatal hernia    Hypertension    IDA (iron deficiency anemia)    Past Surgical History:  Procedure Laterality Date   ABDOMINAL HYSTERECTOMY     BACK SURGERY      lumbar fusion   BILATERAL CARPAL TUNNEL RELEASE     BREAST BIOPSY Left 01/17/2002   collapsed  lung Left    COLONOSCOPY  04/28/2012   COLONOSCOPY  02-12-15   Dr Lemar Livings   ESOPHAGEAL MANOMETRY N/A 06/13/2015   Procedure: ESOPHAGEAL MANOMETRY (EM);  Surgeon: Elnita Maxwell, MD;  Location[facility] ENDOSCOPY;  Service: Endoscopy;  Laterality: N/A;   ESOPHAGUS SURGERY  Oct 2016   Dr Alva Garnet in [place]   KNEE ARTHROSCOPY Left    PARAESOPHAGEAL HERNIA REPAIR  07-26-15   Dr Alva Garnet   PARATHYROIDECTOMY Left    one gland on left removed   TOTAL HIP ARTHROPLASTY Left 05/12/2017   Procedure: TOTAL HIP ARTHROPLASTY ANTERIOR APPROACH;  Surgeon: Kennedy Bucker, MD;  Location: [facility] ORS;  Service: Orthopedics;  Laterality: Left;   TOTAL HIP ARTHROPLASTY Right 03/04/2018   Procedure: TOTAL HIP ARTHROPLASTY ANTERIOR APPROACH;  Surgeon: Kennedy Bucker, MD;  Location: [facility] ORS;  Service: Orthopedics;  Laterality: Right;   UPPER GI ENDOSCOPY  04/28/2012   Social History:  reports that she has quit smoking. Her smoking use included cigarettes.  She has a 30 pack-year smoking history. She has never used smokeless tobacco. She reports that she does not drink alcohol and does not use drugs.  Allergies  Allergen Reactions   Other Other (See Comments)    general anesthesia=difficulty waking   Codeine Nausea And Vomiting   Penicillins Hives    Has patient had a PCN reaction causing immediate rash, facial/tongue/throat swelling, SOB or lightheadedness with hypotension: Yes Has patient had a PCN reaction causing severe rash involving mucus membranes or  skin necrosis: Unknown Has patient had a PCN reaction that required hospitalization: No Has patient had a PCN reaction occurring within the last 10 years: No If all of the above answers are "NO", then may proceed with Cephalosporin use.     History reviewed. No pertinent family history.  Prior to Admission medications   Medication Sig Start Date End Date Taking? Authorizing Provider  albuterol (VENTOLIN HFA) 108 (90 Base) MCG/ACT inhaler Inhale into the lungs every 6 (six) hours as needed for wheezing or shortness of breath.    [provider]  carvedilol (COREG) 6.25 MG tablet Take 6.25 mg by mouth daily. 02/08/15   [provider]  cetirizine (ZYRTEC) 10 MG tablet Take 10 mg by mouth daily. Patient not taking: Reported on 02/03/2023 06/06/20   [provider]  Cholecalciferol 25 MCG (1000 UT) CHEW Chew 1 tablet by mouth daily. Patient not taking: Reported on 02/03/2023    [provider]  citalopram (CELEXA) 10 MG tablet Take 10 mg by mouth daily. Patient not taking: Reported on 02/03/2023 05/05/22   [provider]  citalopram (CELEXA) 20 MG tablet Take 20 mg by mouth daily.    [provider]  colestipol (COLESTID) 1 g tablet Take 2 tablets (2 g total) by mouth daily. 04/11/21 05/29/22  Pasty Spillers, MD  Cyanocobalamin (B-12 PO) Take 1 capsule by mouth daily. Patient not taking: Reported on 02/03/2023    [provider]  diclofenac Sodium (VOLTAREN) 1 % GEL Apply topically 4 (four) times daily as needed. Patient not taking: Reported on 02/03/2023    [provider]  DULoxetine (CYMBALTA) 20 MG capsule Take 20 mg by mouth 2 (two) times daily. Patient not taking: Reported on 02/03/2023    [provider]  DULoxetine (CYMBALTA) 30 MG capsule Take 30 mg by mouth 2 (two) times daily.    [provider]  gabapentin (NEURONTIN) 300 MG capsule Take 1 capsule (300 mg total) by mouth 2 (two) times daily.  05/29/22   Edward Jolly, MD  traMADol (ULTRAM) 50 MG tablet Take 1 tablet (50 mg total) by mouth every 6 (six) hours as needed. 02/03/23 02/03/24  Jene Every, MD  triamterene-hydrochlorothiazide (MAXZIDE-25) 37.5-25 MG tablet Take 1 tablet by mouth daily. Patient not taking: Reported on 02/03/2023 02/18/20   [provider]    Physical Exam: Vitals:   09/03/23 0830 09/03/23 1145 09/03/23 1146 09/03/23 1330  BP: 110/60 137/78  139/76  Pulse: 64 62  65  Resp: 17 12  16   Temp:   97.8 F (36.6 C)   TempSrc:   Oral   SpO2: 99% 100%  100%  Weight:      Height:       Physical Exam Vitals and nursing note reviewed.  Constitutional:      General: She is not in acute distress.    Appearance: She is obese. She is not toxic-appearing.  HENT:     Head: Normocephalic and atraumatic.  Mouth/Throat:     Mouth: Mucous membranes are moist.     Pharynx: Oropharynx is clear.  Eyes:     Extraocular Movements: Extraocular movements intact.     Pupils: Pupils are equal, round, and reactive to light.  Cardiovascular:     Rate and Rhythm: Normal rate and regular rhythm.     Heart sounds: No murmur heard. Pulmonary:     Effort: Pulmonary effort is normal. No respiratory distress.     Breath sounds: Normal breath sounds.  Abdominal:     General: Bowel sounds are normal. There is no distension. There are no signs of injury.     Palpations: Abdomen is soft.     Tenderness: There is no abdominal tenderness.     Hernia: No hernia is present.  Musculoskeletal:     Right lower leg: No edema.     Left lower leg: No edema.  Skin:    General: Skin is warm and dry.  Neurological:     Mental Status: She is alert.     Comments:  Patient is alert and oriented to person, place but not time.  She was unclear on the month and stated it was 2004 No focal weakness  Psychiatric:        Mood and Affect: Mood is anxious.     Comments: Tearful regarding daughter's passing from years prior. Memory  is impaired.    Data Reviewed: CBC with WBC 7.4, hemoglobin of 12.3, platelets of 195 CMP with sodium of 139, potassium 5.0, bicarb 24, glucose 106, BUN 31, creatinine 1.40, AST 26, ALT 27 and GFR 37  Urinalysis with very high squamous epithelial cell count at 21-50.  Will need to repeat for accurate results.  Did note large hematuria, large leukocytes and rare bacteria though.  EKG personally reviewed.  Sinus rhythm with rate of 68.  No ST or T wave changes concerning for acute ischemia.  CT ABDOMEN PELVIS W CONTRAST  Result Date: 09/03/2023 CLINICAL DATA:  Abdominal pain, acute, nonlocalized. EXAM: CT ABDOMEN AND PELVIS WITH CONTRAST TECHNIQUE: Multidetector CT imaging of the abdomen and pelvis was performed using the standard protocol following bolus administration of intravenous contrast. RADIATION DOSE REDUCTION: This exam was performed according to the departmental dose-optimization program which includes automated exposure control, adjustment of the mA and/or kV according to patient size and/or use of iterative reconstruction technique. CONTRAST:  80mL OMNIPAQUE IOHEXOL 300 MG/ML  SOLN COMPARISON:  CT abdomen/pelvis dated May 20, 2022 FINDINGS: Lower chest: No acute abnormality. There is a 5 mm posterior right lower lobe nodule and a 4 mm left lower lobe nodule (series 4, images 6 and 8), which are not significantly changed in size since the prior exam dated February 09, 2015, favoring a benign etiology and requiring no further follow-up. Bibasilar linear atelectasis or scarring. Hepatobiliary: No suspicious focal hepatic lesion. Stable subcentimeter focal hypodensity within the inferior right hepatic lobe, too small to definitively characterize. No gallstones. No gallbladder wall thickening. No biliary dilatation. Pancreas: Unremarkable. No pancreatic ductal dilatation or surrounding inflammatory changes. Spleen: Normal in size without focal abnormality. Adrenals/Urinary Tract: Stable 1.8 cm right  adrenal lesion, previously characterized as an adrenal adenoma. Kidneys enhance symmetrically. The left adrenal gland is unremarkable. There is a 10 mm calculus in the proximal right ureter with moderate upstream hydroureteronephrosis. No left-sided renal calculi or hydronephrosis. The bladder is grossly unremarkable although evaluation is limited due to streak artifact from surrounding orthopedic hardware. Stomach/Bowel: Postoperative changes related to prior partial  gastric resection and hiatal hernia repair. Appendix appears normal. No evidence of bowel wall thickening, distention, or focal inflammatory changes. Colonic diverticulosis without evidence of acute diverticulitis. Vascular/Lymphatic: Aortic atherosclerosis. No enlarged abdominal or pelvic lymph nodes. Reproductive: Status post hysterectomy. No adnexal masses. Other: No intra-abdominal free fluid or free air. Musculoskeletal: Status post bilateral total hip arthroplasties. Multilevel degenerative changes of the spine, most pronounced at L4-L5. Redemonstration moderate to severe spinal canal at L2-L3. IMPRESSION: 1. 10 mm calculus in the proximal right ureter with moderate upstream hydroureteronephrosis. 2.  Aortic Atherosclerosis (ICD10-I70.0). 3. Additional unchanged ancillary findings as described above. Electronically Signed   By: Hart Robinsons M.D.   On: 09/03/2023 11:21    Results are pending, will review when available.  Assessment and Plan:  * Generalized weakness Patient reporting increasing generalized weakness in the setting of chronic diarrhea.  Patient son at bedside is worried her diet is insufficient as well.  Overall, likely multifactorial in addition to chronic deconditioning.  No focal weakness on exam to suggest central process.  - PT/OT  Diarrhea Patient reports 3-52-month history of daily diarrhea of approximate 4 episodes that is nonbloody, nonmelanotic.  - Will check GI panel given patient is on well system - If  negative, start Imodium - Push oral fluid intake  Nephrolithiasis CT imaging notable for large right kidney stone measuring 10 mm.  This was present back in July 2023, however she is now slowly developing hydroureteronephrosis.  Renal function is at baseline.  No CVA tenderness or dysuria noted; no bacteria noted on catheterized UA, so we will hold off on further antibiotics.  - Urology consulted; appreciate their recommendations - Plan for ureteroscopy tomorrow - N.p.o. after midnight  Chronic kidney disease, stage 3b (HCC) Per chart review, patient has a history of CKD dating back to 2020, currently stage IIIb with creatinine ranging between 1.25 and 1.4 in the past, currently 1.4 at this time.   - S/p 1 L bolus - Push p.o. oral intake - Avoid nephrotoxic agents - Repeat BMP in the a.m.  Dementia [facility]) Patient has a history of mixed Alzheimer's and vascular dementia with recent evaluation by neurology in July.  She was started on Namenda.  Most MMSE: 16/30 (May 2024).  Patient seems at baseline at this time  - Continue home regimen - Delirium precautions  Essential hypertension - Continue home regimen with the exception of nephrotoxic agents  Chronic pain syndrome In the setting of chronic back pain. Previous pain contract with Dr. Cherylann Ratel, however voided due to recurrent violations. There was concern of family diverting patient's medications. PDMP reviewed and no longer on chronic opioid therapy.  Advance Care Planning:   Code Status: Full Code verified by patient with her son at bedside  Consults: Urology  Family Communication: Patient's son updated at bedside  Severity of Illness: The appropriate patient status for this patient is OBSERVATION. Observation status is judged to be reasonable and necessary in order to provide the required intensity of service to ensure the patient's safety. The patient's presenting symptoms, physical exam findings, and initial radiographic and  laboratory data in the context of their medical condition is felt to place them at decreased risk for further clinical deterioration. Furthermore, it is anticipated that the patient will be medically stable for discharge from the hospital within 2 midnights of admission.   Author: Verdene Lennert, MD 09/03/2023 2:54 PM  For on call review [email or web address].

## 2023-09-03 NOTE — H&P (View-Only) (Signed)
Urology Consult  I have been asked to see the patient by Dr. Erma Heritage, for evaluation and management of right ureteral stone.  Chief Complaint: Lower abdominal pain, diarrhea  History of Present Illness: Amanda [name] is a 82 y.o. year old female who presented to the ED this morning with reports of lower abdominal pain and loose stool since last night.  CTAP with contrast was notable for a 10 mm proximal right ureteral stone with moderate upstream hydroureteronephrosis.  Admission labs notable for creatinine 1.40 (this line 1.2-1.3); white count 7.4; and UA with >50 RBC/hpf, >50 WBC/hpf, rare bacteria, and 21-50 squamous epithelial cells/hpf.  She was given antibiotics as below.  UA was repeated, this time significantly improved with >50 RBC/hpf, 6-10 WBC/hpf, no bacteria, and 0-5 squamous epithelial cells/hpf.  She denies a history of stone episodes.  Stone was previously seen on CT stone study dated 05/20/2022, at that time nonobstructing in the right lower pole.  Anti-infectives (From admission, onward)    Start     Dose/Rate Route Frequency Ordered Stop   09/03/23 1200  cefTRIAXone (ROCEPHIN) 2 g in sodium chloride 0.9 % 100 mL IVPB        2 g 200 mL/hr over 30 Minutes Intravenous  Once 09/03/23 1149 09/03/23 1300       Past Medical History:  Diagnosis Date   Anxiety    Asthma    Complication of anesthesia    difficulty waking up from anesthesia   GI bleed    Hiatal hernia    Hypertension    IDA (iron deficiency anemia)     Past Surgical History:  Procedure Laterality Date   ABDOMINAL HYSTERECTOMY     BACK SURGERY      lumbar fusion   BILATERAL CARPAL TUNNEL RELEASE     BREAST BIOPSY Left 01/17/2002   collapsed  lung Left    COLONOSCOPY  04/28/2012   COLONOSCOPY  02-12-15   Dr Lemar Livings   ESOPHAGEAL MANOMETRY N/A 06/13/2015   Procedure: ESOPHAGEAL MANOMETRY (EM);  Surgeon: Elnita Maxwell, MD;  Location: [facility] ENDOSCOPY;  Service: Endoscopy;  Laterality: N/A;    ESOPHAGUS SURGERY  Oct 2016   Dr Alva Garnet in [place]   KNEE ARTHROSCOPY Left    PARAESOPHAGEAL HERNIA REPAIR  07-26-15   Dr Alva Garnet   PARATHYROIDECTOMY Left    one gland on left removed   TOTAL HIP ARTHROPLASTY Left 05/12/2017   Procedure: TOTAL HIP ARTHROPLASTY ANTERIOR APPROACH;  Surgeon: Kennedy Bucker, MD;  Location: [facility] ORS;  Service: Orthopedics;  Laterality: Left;   TOTAL HIP ARTHROPLASTY Right 03/04/2018   Procedure: TOTAL HIP ARTHROPLASTY ANTERIOR APPROACH;  Surgeon: Kennedy Bucker, MD;  Location: [facility] ORS;  Service: Orthopedics;  Laterality: Right;   UPPER GI ENDOSCOPY  04/28/2012    Home Medications:  No outpatient medications have been marked as taking for the 09/03/23 encounter [facility] Encounter).    Allergies:  Allergies  Allergen Reactions   Other Other (See Comments)    general anesthesia=difficulty waking   Codeine Nausea And Vomiting   Penicillins Hives    Has patient had a PCN reaction causing immediate rash, facial/tongue/throat swelling, SOB or lightheadedness with hypotension: Yes Has patient had a PCN reaction causing severe rash involving mucus membranes or skin necrosis: Unknown Has patient had a PCN reaction that required hospitalization: No Has patient had a PCN reaction occurring within the last 10 years: No If all of the above answers are "NO", then may proceed with Cephalosporin use.  History reviewed. No pertinent family history.  Social History:  reports that she has quit smoking. Her smoking use included cigarettes. She has a 30 pack-year smoking history. She has never used smokeless tobacco. She reports that she does not drink alcohol and does not use drugs.  ROS: A complete review of systems was performed.  All systems are negative except for pertinent findings as noted.  Physical Exam:  Vital signs in last 24 hours: Temp:  [97.8 F (36.6 C)-98.3 F (36.8 C)] 97.8 F (36.6 C) (10/24 1146) Pulse Rate:  [62-74] 65 (10/24 1330) Resp:  [12-18]  16 (10/24 1330) BP: (110-139)/(60-78) 139/76 (10/24 1330) SpO2:  [96 %-100 %] 100 % (10/24 1330) Weight:  [68.5 kg] 68.5 kg (10/24 0746) Constitutional:  Alert and oriented, no acute distress HEENT: [place] AT, moist mucus membranes Cardiovascular: No clubbing, cyanosis, or edema Respiratory: Normal respiratory effort Skin: No rashes, bruises or suspicious lesions Neurologic: Grossly intact, no focal deficits, moving all 4 extremities Psychiatric: Normal mood and affect  Laboratory Data:  Recent Labs    09/03/23 0750  WBC 7.4  HGB 12.3  HCT 38.5   Recent Labs    09/03/23 0750  NA 139  K 5.0  CL 107  CO2 24  GLUCOSE 106*  BUN 31*  CREATININE 1.40*  CALCIUM 9.3   Urinalysis    Component Value Date/Time   COLORURINE STRAW (A) 09/03/2023 1354   APPEARANCEUR CLEAR (A) 09/03/2023 1354   APPEARANCEUR Clear 02/09/2015 1254   LABSPEC 1.017 09/03/2023 1354   LABSPEC 1.011 02/09/2015 1254   PHURINE 5.0 09/03/2023 1354   GLUCOSEU NEGATIVE 09/03/2023 1354   GLUCOSEU Negative 02/09/2015 1254   HGBUR LARGE (A) 09/03/2023 1354   BILIRUBINUR NEGATIVE 09/03/2023 1354   BILIRUBINUR Negative 02/09/2015 1254   KETONESUR NEGATIVE 09/03/2023 1354   PROTEINUR NEGATIVE 09/03/2023 1354   NITRITE NEGATIVE 09/03/2023 1354   LEUKOCYTESUR NEGATIVE 09/03/2023 1354   LEUKOCYTESUR Trace 02/09/2015 1254   Results for orders placed or performed during the hospital encounter of 02/18/18  Surgical pcr screen     Status: None   Collection Time: 02/18/18 12:01 PM   Specimen: Nasal Mucosa; Nasal Swab  Result Value Ref Range Status   MRSA, PCR NEGATIVE NEGATIVE Final   Staphylococcus aureus NEGATIVE NEGATIVE Final    Comment: (NOTE) The Xpert SA Assay (FDA approved for NASAL specimens in patients 11 years of age and older), is one component of a comprehensive surveillance program. It is not intended to diagnose infection nor to guide or monitor treatment. Performed at [facility], [address], [place], [place] [address]     Radiologic Imaging: CT ABDOMEN PELVIS W CONTRAST  Result Date: 09/03/2023 CLINICAL DATA:  Abdominal pain, acute, nonlocalized. EXAM: CT ABDOMEN AND PELVIS WITH CONTRAST TECHNIQUE: Multidetector CT imaging of the abdomen and pelvis was performed using the standard protocol following bolus administration of intravenous contrast. RADIATION DOSE REDUCTION: This exam was performed according to the departmental dose-optimization program which includes automated exposure control, adjustment of the mA and/or kV according to patient size and/or use of iterative reconstruction technique. CONTRAST:  80mL OMNIPAQUE IOHEXOL 300 MG/ML  SOLN COMPARISON:  CT abdomen/pelvis dated May 20, 2022 FINDINGS: Lower chest: No acute abnormality. There is a 5 mm posterior right lower lobe nodule and a 4 mm left lower lobe nodule (series 4, images 6 and 8), which are not significantly changed in size since the prior exam dated February 09, 2015, favoring a benign etiology and  requiring no further follow-up. Bibasilar linear atelectasis or scarring. Hepatobiliary: No suspicious focal hepatic lesion. Stable subcentimeter focal hypodensity within the inferior right hepatic lobe, too small to definitively characterize. No gallstones. No gallbladder wall thickening. No biliary dilatation. Pancreas: Unremarkable. No pancreatic ductal dilatation or surrounding inflammatory changes. Spleen: Normal in size without focal abnormality. Adrenals/Urinary Tract: Stable 1.8 cm right adrenal lesion, previously characterized as an adrenal adenoma. Kidneys enhance symmetrically. The left adrenal gland is unremarkable. There is a 10 mm calculus in the proximal right ureter with moderate upstream hydroureteronephrosis. No left-sided renal calculi or hydronephrosis. The bladder is grossly unremarkable although evaluation is limited due to streak artifact from surrounding orthopedic hardware. Stomach/Bowel:  Postoperative changes related to prior partial gastric resection and hiatal hernia repair. Appendix appears normal. No evidence of bowel wall thickening, distention, or focal inflammatory changes. Colonic diverticulosis without evidence of acute diverticulitis. Vascular/Lymphatic: Aortic atherosclerosis. No enlarged abdominal or pelvic lymph nodes. Reproductive: Status post hysterectomy. No adnexal masses. Other: No intra-abdominal free fluid or free air. Musculoskeletal: Status post bilateral total hip arthroplasties. Multilevel degenerative changes of the spine, most pronounced at L4-L5. Redemonstration moderate to severe spinal canal at L2-L3. IMPRESSION: 1. 10 mm calculus in the proximal right ureter with moderate upstream hydroureteronephrosis. 2.  Aortic Atherosclerosis (ICD10-I70.0). 3. Additional unchanged ancillary findings as described above. Electronically Signed   By: Hart Robinsons M.D.   On: 09/03/2023 11:21    Assessment & Plan:  82 year old female admitted with a 10 mm proximal right ureteral stone.  Initial UA was suspicious for UTI, though contaminated.  Repeat UA collected after Rocephin was significantly improved.  Overall, low suspicion for infection and no signs of sepsis.  Lower abdominal pain and diarrhea possibly secondary to ureteral stone, though cannot rule out GI source.  I offered her right ureteroscopy with laser lithotripsy and stent placement with Dr. Richardo Hanks tomorrow.  We discussed that if there are any intraoperative signs of infection, we will not perform a ureteroscopy and only place a ureteral stent to relieve urinary obstruction and allow for source control of her infection.  In that case, she would need outpatient ureteroscopy 2 to 3 weeks thereafter, after completion of culture appropriate antibiotics.  She expressed understanding and wishes to proceed.  Recommendations: -N.p.o. at midnight (ordered) -Supportive care and pain medication per primary team -Right  ureteroscopy with laser lithotripsy and stent placement with Dr. Richardo Hanks tomorrow (informed consent ordered)  Thank you for involving me in this patient's care, I will continue to follow along.  Carman Ching, PA-C 09/03/2023 2:10 PM

## 2023-09-03 NOTE — Assessment & Plan Note (Addendum)
Patient has a history of mixed Alzheimer's and vascular dementia with recent evaluation by neurology in July.  Patient on Aricept and Namenda.  I did have our TOC contact the patient's granddaughter about resources and options moving forward.  I think patient's granddaughter is interested in assisted living for her grandparents.

## 2023-09-03 NOTE — Assessment & Plan Note (Addendum)
Can continue Coreg

## 2023-09-03 NOTE — ED Provider Notes (Signed)
[facility] Provider Note    Event Date/Time   First MD Initiated Contact with Patient 09/03/23 0830     (approximate)   History   Abdominal Pain and Diarrhea   HPI  Amanda [name] is a 82 y.o. female with history of hypertension, depression, CKD, here with abdominal pain and loose stools.  The patient states that starting last night, she developed mild lower abdominal pain.  This is diffuse.  It is aching and throbbing.  She then developed diarrhea that is persistent on the day.  She has had difficulty keeping anything down today.  No fevers or chills.  No urinary symptoms.     Physical Exam   Triage Vital Signs: ED Triage Vitals  Encounter Vitals Group     BP 09/03/23 0749 135/77     Systolic BP Percentile --      Diastolic BP Percentile --      Pulse Rate 09/03/23 0749 74     Resp 09/03/23 0749 18     Temp 09/03/23 0749 98.3 F (36.8 C)     Temp Source 09/03/23 0749 Oral     SpO2 09/03/23 0749 96 %     Weight 09/03/23 0746 151 lb (68.5 kg)     Height 09/03/23 0746 5' 4.5" (1.638 m)     Head Circumference --      Peak Flow --      Pain Score 09/03/23 0746 9     Pain Loc --      Pain Education --      Exclude from Growth Chart --     Most recent vital signs: Vitals:   09/03/23 2100 09/03/23 2200  BP: 96/64 (!) 100/53  Pulse: 67 70  Resp: 13 16  Temp:    SpO2: 93% 94%     General: Awake, no distress.  CV:  Good peripheral perfusion.  Resp:  Normal work of breathing.  Lungs clear. Abd:  No distention.  Mild diffuse lower abdominal tenderness.  No guarding or rebound. Other:  No lower extremity edema.   ED Results / Procedures / Treatments   Labs (all labs ordered are listed, but only abnormal results are displayed) Labs Reviewed  COMPREHENSIVE METABOLIC PANEL - Abnormal; Notable for the following components:      Result Value   Glucose, Bld 106 (*)    BUN 31 (*)    Creatinine, Ser 1.40 (*)    GFR, Estimated 37 (*)    All  other components within normal limits  URINALYSIS, ROUTINE W REFLEX MICROSCOPIC - Abnormal; Notable for the following components:   Color, Urine YELLOW (*)    APPearance CLOUDY (*)    Hgb urine dipstick LARGE (*)    Leukocytes,Ua LARGE (*)    Bacteria, UA RARE (*)    All other components within normal limits  URINALYSIS, ROUTINE W REFLEX MICROSCOPIC - Abnormal; Notable for the following components:   Color, Urine STRAW (*)    APPearance CLEAR (*)    Hgb urine dipstick LARGE (*)    All other components within normal limits  URINE CULTURE  GASTROINTESTINAL PANEL BY PCR, STOOL (REPLACES STOOL CULTURE)  LIPASE, BLOOD  CBC  CBC WITH DIFFERENTIAL/PLATELET  BASIC METABOLIC PANEL  MAGNESIUM     EKG Normal sinus rhythm, Triklo rate 68.  PR 153, QRS 151, QTc 406.  No acute ST elevation or depression.  No EKG evidence of acute ischemia or infarct.   RADIOLOGY CT A/p: 10 mm calculus  of R ureter with moderate hydro   I also independently reviewed and agree with radiologist interpretations.   PROCEDURES:  Critical Care performed: No   MEDICATIONS ORDERED IN ED: Medications  sodium chloride flush (NS) 0.9 % injection 3 mL (3 mLs Intravenous Given 09/03/23 2134)  acetaminophen (TYLENOL) tablet 650 mg (650 mg Oral Given 09/03/23 1531)    Or  acetaminophen (TYLENOL) suppository 650 mg ( Rectal See Alternative 09/03/23 1531)  ondansetron (ZOFRAN) tablet 4 mg (has no administration in time range)    Or  ondansetron (ZOFRAN) injection 4 mg (has no administration in time range)  oxyCODONE (Oxy IR/ROXICODONE) immediate release tablet 5-10 mg (has no administration in time range)  albuterol (PROVENTIL) (2.5 MG/3ML) 0.083% nebulizer solution 2.5 mg (has no administration in time range)  aspirin EC tablet 81 mg (has no administration in time range)  carvedilol (COREG) tablet 6.25 mg (has no administration in time range)  atorvastatin (LIPITOR) tablet 20 mg (20 mg Oral Given 09/03/23 2133)   citalopram (CELEXA) tablet 10 mg (has no administration in time range)  cyanocobalamin (VITAMIN B12) tablet 1,000 mcg (has no administration in time range)  donepezil (ARICEPT) tablet 5 mg (5 mg Oral Given 09/03/23 2134)  DULoxetine (CYMBALTA) DR capsule 30 mg (30 mg Oral Given 09/03/23 2133)  gabapentin (NEURONTIN) capsule 300 mg (300 mg Oral Given 09/03/23 2133)  memantine (NAMENDA) tablet 10 mg (10 mg Oral Given 09/03/23 2133)  lactated ringers bolus 1,000 mL (0 mLs Intravenous Stopped 09/03/23 1230)  morphine (PF) 2 MG/ML injection 2 mg (2 mg Intravenous Given 09/03/23 1025)  ondansetron (ZOFRAN) injection 4 mg (4 mg Intravenous Given 09/03/23 1024)  iohexol (OMNIPAQUE) 300 MG/ML solution 80 mL (80 mLs Intravenous Contrast Given 09/03/23 1004)  fentaNYL (SUBLIMAZE) injection 50 mcg (50 mcg Intravenous Given 09/03/23 1159)  cefTRIAXone (ROCEPHIN) 2 g in sodium chloride 0.9 % 100 mL IVPB (0 g Intravenous Stopped 09/03/23 1300)  traZODone (DESYREL) tablet 50 mg (50 mg Oral Given 09/03/23 2026)     IMPRESSION / MDM / ASSESSMENT AND PLAN / ED COURSE  I reviewed the triage vital signs and the nursing notes.                              Differential diagnosis includes, but is not limited to, diverticulitis, colitis, enteritis, appendicitis, obstruction, UTI  Patient's presentation is most consistent with acute presentation with potential threat to life or bodily function.  The patient is on the cardiac monitor to evaluate for evidence of arrhythmia and/or significant heart rate changes   82 yo F here with abdominal pain, diarrhea, weakness. Interestingly, imaging shows large 10 mm stone in the R ureter with moderate hydro. Pt also has UA concerning for possible UTI, though she has no fever or signs of sepsis. Urology consulted, IV abx started, and will admit. While this stone has been noted previously, pt's pain and UA with hydro c/f obstruction. CMP shows mild dehydration with BUN 31, Cr  1.4. Will admit to medicine.   FINAL CLINICAL IMPRESSION(S) / ED DIAGNOSES   Final diagnoses:  Generalized abdominal pain  Ureteral stone     Rx / DC Orders   ED Discharge Orders     None        Note:  This document was prepared using Dragon voice recognition software and may include unintentional dictation errors.   Shaune Pollack, MD 09/04/23 [phone]

## 2023-09-03 NOTE — ED Notes (Signed)
The pt was assisted to a standing position and able to ambulate with assistance to the toilet without incident. The pt advises she is having a headache and some pain all over.

## 2023-09-03 NOTE — Assessment & Plan Note (Addendum)
Creatinine 1.42 upon discharge.  GFR of 36.

## 2023-09-03 NOTE — ED Notes (Signed)
Pt placed in hospital bed at this time

## 2023-09-03 NOTE — Assessment & Plan Note (Addendum)
Did well with PT and OT.  Recommended no follow-up.

## 2023-09-03 NOTE — ED Triage Notes (Signed)
First Nurse Note;  Pt via [facility] from home. Pt c/o diarrhea. Pt has a hx of same and last night it got worse, and states it was uncontrollable. Lower abd pain. Pt is A&Ox4 and NAD.  98.6 oral  137 CBG  72 HR  93% on RA  174/83

## 2023-09-03 NOTE — ED Notes (Addendum)
Call light within reach.

## 2023-09-03 NOTE — Assessment & Plan Note (Addendum)
Patient did not have any diarrhea here in the hospital.  Could be dietary related at home.  Patient drinks coffee at home.

## 2023-09-03 NOTE — Assessment & Plan Note (Addendum)
Patient asking for pain medications.  We do not give pain medications for chronic back pain.  Recommend Tylenol.  She can talk with her PCP about this.

## 2023-09-04 ENCOUNTER — Observation Stay: Payer: Medicare Other | Admitting: Certified Registered"

## 2023-09-04 ENCOUNTER — Encounter
Admission: EM | Disposition: A | Discharge status: Home / Self Care | Payer: Self-pay | Source: Home / Self Care | Attending: Emergency Medicine

## 2023-09-04 ENCOUNTER — Encounter: Payer: Self-pay | Admitting: Urology

## 2023-09-04 ENCOUNTER — Observation Stay: Payer: Medicare Other

## 2023-09-04 DIAGNOSIS — F01A0 Vascular dementia, mild, without behavioral disturbance, psychotic disturbance, mood disturbance, and anxiety: Secondary | ICD-10-CM

## 2023-09-04 DIAGNOSIS — R1084 Generalized abdominal pain: Principal | ICD-10-CM

## 2023-09-04 DIAGNOSIS — R197 Diarrhea, unspecified: Secondary | ICD-10-CM | POA: Diagnosis not present

## 2023-09-04 DIAGNOSIS — N200 Calculus of kidney: Secondary | ICD-10-CM | POA: Diagnosis not present

## 2023-09-04 DIAGNOSIS — R531 Weakness: Secondary | ICD-10-CM | POA: Diagnosis not present

## 2023-09-04 DIAGNOSIS — G894 Chronic pain syndrome: Secondary | ICD-10-CM | POA: Diagnosis not present

## 2023-09-04 DIAGNOSIS — Z466 Encounter for fitting and adjustment of urinary device: Secondary | ICD-10-CM | POA: Diagnosis not present

## 2023-09-04 DIAGNOSIS — N1330 Unspecified hydronephrosis: Secondary | ICD-10-CM | POA: Diagnosis not present

## 2023-09-04 DIAGNOSIS — N1832 Chronic kidney disease, stage 3b: Secondary | ICD-10-CM | POA: Diagnosis not present

## 2023-09-04 DIAGNOSIS — I10 Essential (primary) hypertension: Secondary | ICD-10-CM | POA: Diagnosis not present

## 2023-09-04 DIAGNOSIS — Z87891 Personal history of nicotine dependence: Secondary | ICD-10-CM | POA: Diagnosis not present

## 2023-09-04 HISTORY — PX: CYSTOSCOPY/URETEROSCOPY/HOLMIUM LASER/STENT PLACEMENT: SHX6546

## 2023-09-04 LAB — CBC WITH DIFFERENTIAL/PLATELET
Abs Immature Granulocytes: 0.01 10*3/uL (ref 0.00–0.07)
Basophils Absolute: 0 10*3/uL (ref 0.0–0.1)
Basophils Relative: 1 %
Eosinophils Absolute: 0.1 10*3/uL (ref 0.0–0.5)
Eosinophils Relative: 3 %
HCT: 36.5 % (ref 36.0–46.0)
Hemoglobin: 11.9 g/dL — ABNORMAL LOW (ref 12.0–15.0)
Immature Granulocytes: 0 %
Lymphocytes Relative: 39 %
Lymphs Abs: 1.8 10*3/uL (ref 0.7–4.0)
MCH: 31 pg (ref 26.0–34.0)
MCHC: 32.6 g/dL (ref 30.0–36.0)
MCV: 95.1 fL (ref 80.0–100.0)
Monocytes Absolute: 0.4 10*3/uL (ref 0.1–1.0)
Monocytes Relative: 10 %
Neutro Abs: 2.1 10*3/uL (ref 1.7–7.7)
Neutrophils Relative %: 47 %
Platelets: 164 10*3/uL (ref 150–400)
RBC: 3.84 MIL/uL — ABNORMAL LOW (ref 3.87–5.11)
RDW: 15.5 % (ref 11.5–15.5)
WBC: 4.5 10*3/uL (ref 4.0–10.5)
nRBC: 0 % (ref 0.0–0.2)

## 2023-09-04 LAB — MAGNESIUM: Magnesium: 2.2 mg/dL (ref 1.7–2.4)

## 2023-09-04 LAB — BASIC METABOLIC PANEL
Anion gap: 7 (ref 5–15)
BUN: 26 mg/dL — ABNORMAL HIGH (ref 8–23)
CO2: 23 mmol/L (ref 22–32)
Calcium: 8.8 mg/dL — ABNORMAL LOW (ref 8.9–10.3)
Chloride: 106 mmol/L (ref 98–111)
Creatinine, Ser: 1.42 mg/dL — ABNORMAL HIGH (ref 0.44–1.00)
GFR, Estimated: 36 mL/min — ABNORMAL LOW (ref >60)
Glucose, Bld: 96 mg/dL (ref 70–99)
Potassium: 4.5 mmol/L (ref 3.5–5.1)
Sodium: 136 mmol/L (ref 135–145)

## 2023-09-04 LAB — URINE CULTURE: Culture: <10000 — AB

## 2023-09-04 SURGERY — CYSTOSCOPY/URETEROSCOPY/HOLMIUM LASER/STENT PLACEMENT
Anesthesia: General | Laterality: Right

## 2023-09-04 MED ORDER — PROPOFOL 10 MG/ML IV BOLUS
INTRAVENOUS | Status: DC | PRN
  Administered 2023-09-04: 100 mg via INTRAVENOUS

## 2023-09-04 MED ORDER — FENTANYL CITRATE (PF) 100 MCG/2ML IJ SOLN: 25.0000 ug | INTRAMUSCULAR | Status: DC | PRN

## 2023-09-04 MED ORDER — CIPROFLOXACIN IN D5W 400 MG/200ML IV SOLN
400.0000 mg | Freq: Once | INTRAVENOUS | Status: AC
  Administered 2023-09-04: 400 mg via INTRAVENOUS

## 2023-09-04 MED ORDER — INFLUENZA VAC A&B SURF ANT ADJ 0.5 ML IM SUSY: 0.5000 mL | PREFILLED_SYRINGE | INTRAMUSCULAR | Status: DC

## 2023-09-04 MED ORDER — ONDANSETRON HCL 4 MG/2ML IJ SOLN
INTRAMUSCULAR | Status: DC | PRN
  Administered 2023-09-04: 4 mg via INTRAVENOUS

## 2023-09-04 MED ORDER — ROCURONIUM BROMIDE 100 MG/10ML IV SOLN
INTRAVENOUS | Status: DC | PRN
  Administered 2023-09-04: 50 mg via INTRAVENOUS

## 2023-09-04 MED ORDER — LIDOCAINE HCL (CARDIAC) PF 100 MG/5ML IV SOSY
PREFILLED_SYRINGE | INTRAVENOUS | Status: DC | PRN
  Administered 2023-09-04: 60 mg via INTRAVENOUS

## 2023-09-04 MED ORDER — DEXAMETHASONE SODIUM PHOSPHATE 10 MG/ML IJ SOLN
INTRAMUSCULAR | Status: DC | PRN
  Administered 2023-09-04: 10 mg via INTRAVENOUS

## 2023-09-04 MED ORDER — CIPROFLOXACIN IN D5W 400 MG/200ML IV SOLN
INTRAVENOUS | Status: AC
  Filled 2023-09-04: qty 200

## 2023-09-04 MED ORDER — CIPROFLOXACIN HCL 250 MG PO TABS: 250.0000 mg | ORAL_TABLET | Freq: Two times a day (BID) | ORAL | 0 refills | Status: AC

## 2023-09-04 MED ORDER — OXYCODONE HCL 5 MG PO TABS: 5.0000 mg | ORAL_TABLET | Freq: Once | ORAL | Status: DC | PRN

## 2023-09-04 MED ORDER — EPHEDRINE SULFATE-NACL 50-0.9 MG/10ML-% IV SOSY
PREFILLED_SYRINGE | INTRAVENOUS | Status: DC | PRN
  Administered 2023-09-04: 5 mg via INTRAVENOUS
  Administered 2023-09-04 (×2): 10 mg via INTRAVENOUS

## 2023-09-04 MED ORDER — SODIUM CHLORIDE 0.9 % IR SOLN
Status: DC | PRN
  Administered 2023-09-04: 1

## 2023-09-04 MED ORDER — GLYCOPYRROLATE 0.2 MG/ML IJ SOLN
INTRAMUSCULAR | Status: DC | PRN
  Administered 2023-09-04: .2 mg via INTRAVENOUS

## 2023-09-04 MED ORDER — FENTANYL CITRATE (PF) 100 MCG/2ML IJ SOLN
INTRAMUSCULAR | Status: AC
  Filled 2023-09-04: qty 2

## 2023-09-04 MED ORDER — PROPOFOL 10 MG/ML IV BOLUS
INTRAVENOUS | Status: AC
  Filled 2023-09-04: qty 20

## 2023-09-04 MED ORDER — LACTATED RINGERS IV SOLN: INTRAVENOUS | Status: DC | PRN

## 2023-09-04 MED ORDER — CIPROFLOXACIN HCL 500 MG PO TABS: 250.0000 mg | ORAL_TABLET | Freq: Two times a day (BID) | ORAL | Status: DC

## 2023-09-04 MED ORDER — FENTANYL CITRATE (PF) 100 MCG/2ML IJ SOLN
INTRAMUSCULAR | Status: DC | PRN
  Administered 2023-09-04: 25 ug via INTRAVENOUS

## 2023-09-04 MED ORDER — IOHEXOL 180 MG/ML  SOLN
INTRAMUSCULAR | Status: DC | PRN
  Administered 2023-09-04: 5 mL

## 2023-09-04 MED ORDER — PHENYLEPHRINE 80 MCG/ML (10ML) SYRINGE FOR IV PUSH (FOR BLOOD PRESSURE SUPPORT)
PREFILLED_SYRINGE | INTRAVENOUS | Status: DC | PRN
  Administered 2023-09-04 (×3): 160 ug via INTRAVENOUS

## 2023-09-04 MED ORDER — OXYCODONE HCL 5 MG/5ML PO SOLN: 5.0000 mg | Freq: Once | ORAL | Status: DC | PRN

## 2023-09-04 MED ORDER — SUGAMMADEX SODIUM 200 MG/2ML IV SOLN
INTRAVENOUS | Status: DC | PRN
  Administered 2023-09-04: 200 mg via INTRAVENOUS

## 2023-09-04 MED ORDER — PNEUMOCOCCAL 20-VAL CONJ VACC 0.5 ML IM SUSY: 0.5000 mL | PREFILLED_SYRINGE | INTRAMUSCULAR | Status: DC

## 2023-09-04 SURGICAL SUPPLY — 30 items
ADH LQ OCL WTPRF AMP STRL LF (MISCELLANEOUS)
ADHESIVE MASTISOL STRL (MISCELLANEOUS) IMPLANT
BAG DRAIN SIEMENS DORNER NS (MISCELLANEOUS) ×1 IMPLANT
BAG DRN NS LF (MISCELLANEOUS) ×1
BAG PRESSURE INF REUSE 3000 (BAG) ×1 IMPLANT
BRUSH SCRUB EZ 4% CHG (MISCELLANEOUS) IMPLANT
CATH URET FLEX-TIP 2 LUMEN 10F (CATHETERS) IMPLANT
CATH URETL OPEN 5X70 (CATHETERS) IMPLANT
CNTNR URN SCR LID CUP LEK RST (MISCELLANEOUS) IMPLANT
CONT SPEC 4OZ STRL OR WHT (MISCELLANEOUS)
DRAPE UTILITY 15X26 TOWEL STRL (DRAPES) ×1 IMPLANT
DRSG TEGADERM 2-3/8X2-3/4 SM (GAUZE/BANDAGES/DRESSINGS) IMPLANT
FIBER LASER MOSES 365 DFL (Laser) IMPLANT
GLOVE BIOGEL PI IND STRL 7.5 (GLOVE) ×1 IMPLANT
GOWN STRL REUS W/ TWL LRG LVL3 (GOWN DISPOSABLE) ×1 IMPLANT
GOWN STRL REUS W/ TWL XL LVL3 (GOWN DISPOSABLE) ×1 IMPLANT
GOWN STRL REUS W/TWL LRG LVL3 (GOWN DISPOSABLE) ×1
GOWN STRL REUS W/TWL XL LVL3 (GOWN DISPOSABLE) ×1
GUIDEWIRE STR DUAL SENSOR (WIRE) ×1 IMPLANT
IV NS IRRIG 3000ML ARTHROMATIC (IV SOLUTION) ×1 IMPLANT
KIT TURNOVER CYSTO (KITS) ×1 IMPLANT
PACK CYSTO AR (MISCELLANEOUS) ×1 IMPLANT
SET CYSTO W/LG BORE CLAMP LF (SET/KITS/TRAYS/PACK) ×1 IMPLANT
SHEATH NAVIGATOR HD 12/14X36 (SHEATH) IMPLANT
STENT URET 6FRX24 CONTOUR (STENTS) IMPLANT
STENT URET 6FRX26 CONTOUR (STENTS) IMPLANT
SURGILUBE 2OZ TUBE FLIPTOP (MISCELLANEOUS) ×1 IMPLANT
SYR 10ML LL (SYRINGE) ×1 IMPLANT
VALVE UROSEAL ADJ ENDO (VALVE) IMPLANT
WATER STERILE IRR 500ML POUR (IV SOLUTION) ×1 IMPLANT

## 2023-09-04 NOTE — Op Note (Signed)
Date of procedure: 09/04/23  Preoperative diagnosis:  Right proximal ureteral stone  Postoperative diagnosis:  Same  Procedure: Cystoscopy, right ureteroscopy, laser lithotripsy, right retrograde pyelogram with intraoperative interpretation, right ureteral stent placement  Surgeon: Legrand Rams, MD  Anesthesia: General  Complications: None  Intraoperative findings:  1 cm stone at the UPJ dusted, stent placed  EBL: Minimal  Specimens: None  Drains: Right 6 French by 24 cm ureteral stent  Indication: Amanda [name] is a 82 y.o. patient with abdominal pain and a 1 cm right proximal ureteral stone, urinalysis showed no evidence of infection and she opted for ureteroscopy.  After reviewing the management options for treatment, they elected to proceed with the above surgical procedure(s). We have discussed the potential benefits and risks of the procedure, side effects of the proposed treatment, the likelihood of the patient achieving the goals of the procedure, and any potential problems that might occur during the procedure or recuperation. Informed consent has been obtained.  Description of procedure:  The patient was taken to the operating room and general anesthesia was induced. SCDs were placed for DVT prophylaxis.. The patient was placed in the dorsal lithotomy position, prepped and draped in the usual sterile fashion, and preoperative antibiotics(Cipro) were administered. A preoperative time-out was performed.   A 21 French rigid cystoscope was used to intubate the urethra and thorough cystoscopy was performed.  There were moderate bladder trabeculations and some diverticula, but no suspicious lesions, the ureteral orifices were orthotopic bilaterally.  A sensor wire was used to intubate the right ureteral orifice and advanced up to the kidney under fluoroscopic vision.  A dual-lumen ureteral access catheter was advanced over the wire to the proximal ureter, and a second sensor  safety wire was added.  A digital single-channel flexible ureteroscope was advanced over the wire to the level of the stone in the proximal ureter, and a 365 m laser fiber on settings of 1.0 J and 10 Hz was used to methodically fragment the stone.  Approximately 70% of the stone was fragmented in the proximal ureter and the residual fragments were pushed back up into the kidney.  The fragments were identified within the kidney and the laser fiber on settings of 0.5 J and 80 Hz were used to methodically dust the residual fragments.  No fragments larger than the laser fiber were identified at the conclusion of the case.  A retrograde pyelogram from the proximal ureter initially showed no extravasation or filling defects, however as I backed the scope out there did appear to be some extravasation proximally.  Pullback ureteroscopy showed no ureteral stones or other abnormalities.  The rigid cystoscope was backloaded over the wire and a 6 [place] by 24 cm ureteral stent was placed with a curl in the upper pole, as well as under direct vision in the bladder.  Fluid drained through the side ports of the stent.  The bladder was drained and this concluded our procedure.  Disposition: Stable to PACU  Plan: Return to hospitalist service, can discharge from urology perspective if pain controlled Will coordinate stent removal in clinic in 2 weeks  Legrand Rams, MD

## 2023-09-04 NOTE — Transfer of Care (Signed)
Immediate Anesthesia Transfer of Care Note  [name]  Procedure(s) Performed: CYSTOSCOPY/URETEROSCOPY/HOLMIUM LASER/STENT PLACEMENT (Right)  Patient Location: PACU  Anesthesia Type:General  Level of Consciousness: awake, drowsy, and patient cooperative  Airway & Oxygen Therapy: Patient Spontanous Breathing and Patient connected to face mask oxygen  Post-op Assessment: Report given to RN, Post -op Vital signs reviewed and stable, and Patient moving all extremities X 4  Post vital signs: Reviewed and stable  Last Vitals:  Vitals Value Taken Time  BP 131/63 09/04/23 0841  Temp    Pulse 80 09/04/23 0842  Resp 18 09/04/23 0842  SpO2 98 % 09/04/23 0842  Vitals shown include unfiled device data.  Last Pain:  Vitals:   09/04/23 0720  TempSrc: Temporal  PainSc: 0-No pain         Complications: No notable events documented.

## 2023-09-04 NOTE — Interval H&P Note (Signed)
UROLOGY H&P UPDATE  Agree with prior H&P dated 09/03/2023.  82 year old female admitted with abdominal pain and diarrhea, CT with 10 mm right proximal ureteral stone, no evidence of infection on repeat urinalysis.  We reviewed at length.  Diarrhea and lower abdominal pain may be unrelated to stone, but recommend treatment while admitted in the setting of large stone too big to pass spontaneously.  Cardiac: RRR Lungs: CTA bilaterally  Laterality: RIGHT Procedure: Right ureteroscopy, laser lithotripsy, stent placement  We specifically discussed the risks ureteroscopy including bleeding, infection/sepsis, stent related symptoms including flank pain/urgency/frequency/incontinence/dysuria, ureteral injury, ureteral stricture, inability to access stone, or need for staged or additional procedures.   Sondra Come, MD 09/04/2023

## 2023-09-04 NOTE — ED Notes (Signed)
Pt up to bedside to use commode

## 2023-09-04 NOTE — Discharge Summary (Signed)
Physician Discharge Summary   [name] MRN: [id number] DOB: 08/13/1941  Admit date:     09/03/2023  Discharge date: 09/04/23  Discharge Physician: Alford Highland   PCP: Enid Baas, MD   Recommendations at discharge:   Follow-up with PCP 5 days Follow-up with Dr. Geradine Girt urology 2 weeks to remove stent  Discharge Diagnoses: Principal Problem:   Nephrolithiasis Active Problems:   Generalized weakness   Diarrhea   Chronic kidney disease, stage 3b (HCC)   Dementia (HCC)   Essential hypertension   Chronic pain syndrome   Generalized abdominal pain   Hydroureteronephrosis   Hospital Course: 82 year old female past medical history of Alzheimer's disease and vascular dementia, CKD stage IIIb, CVA, chronic pain syndrome, chronic kidney stones, hypertension, hyperlipidemia, osteoporosis, B12 deficiency presents to the ED due to abdominal pain and diarrhea.  Patient having abdominal pain and diarrhea.  CT scan of the abdomen pelvis showed 10 mm calculus in the proximal right ureter with moderate upstream hydro ureter nephrosis.  10/25.  Dr. Richardo Hanks brought to the operating room for a right stent placement and laser lithotripsy.  Dr. Richardo Hanks cleared to go home on 3 days of antibiotics.  They will call to set up appointment for removal of stent in 2 weeks.  Patient did not have any diarrhea here in the hospital.  Could be dietary related at home.  Follow-up with PCP.  Patient also complaining of chronic back pain and wanted pain medications I will have her follow-up with her PCP about this also recommended Tylenol.  Assessment and Plan: * Nephrolithiasis Patient had a large kidney stone with hydroureteronephrosis seen on CT scan.  Patient was brought to the operating room on 10/25 by Dr. Richardo Hanks for stent placement and laser lithotripsy of the stone.  They will call her for follow-up for stent removal in about 2 weeks.  Generalized weakness Did well with PT and OT.   Recommended no follow-up.  Diarrhea Patient did not have any diarrhea here in the hospital.  Could be dietary related at home.  Patient drinks coffee at home.  Chronic kidney disease, stage 3b (HCC) Creatinine 1.42 upon discharge.  GFR of 36.  Dementia [facility]) Patient has a history of mixed Alzheimer's and vascular dementia with recent evaluation by neurology in July.  Patient on Aricept and Namenda.  I did have our TOC contact the patient's granddaughter about resources and options moving forward.  I think patient's granddaughter is interested in assisted living for her grandparents.  Essential hypertension Can continue Coreg  Chronic pain syndrome Patient asking for pain medications.  We do not give pain medications for chronic back pain.  Recommend Tylenol.  She can talk with her PCP about this.         Consultants: Urology Procedures performed: Right stent placement and laser lithotripsy of stone. Disposition: Home Diet recommendation:  Cardiac diet DISCHARGE MEDICATION: Allergies as of 09/04/2023       Reactions   Other Other (See Comments)   general anesthesia=difficulty waking   Codeine Nausea And Vomiting   Penicillins Hives   Has patient had a PCN reaction causing immediate rash, facial/tongue/throat swelling, SOB or lightheadedness with hypotension: Yes Has patient had a PCN reaction causing severe rash involving mucus membranes or skin necrosis: Unknown Has patient had a PCN reaction that required hospitalization: No Has patient had a PCN reaction occurring within the last 10 years: No If all of the above answers are "NO", then may proceed with Cephalosporin use.  Medication List     STOP taking these medications    B-12 PO   cetirizine 10 MG tablet Commonly known as: ZYRTEC   Cholecalciferol 25 MCG (1000 UT) Chew   diclofenac Sodium 1 % Gel Commonly known as: VOLTAREN   gabapentin 300 MG capsule Commonly known as: NEURONTIN   traMADol 50  MG tablet Commonly known as: Ultram   triamterene-hydrochlorothiazide 37.5-25 MG capsule Commonly known as: DYAZIDE       TAKE these medications    albuterol 108 (90 Base) MCG/ACT inhaler Commonly known as: VENTOLIN HFA Inhale into the lungs every 6 (six) hours as needed for wheezing or shortness of breath.   aspirin EC 81 MG tablet Take 81 mg by mouth daily.   atorvastatin 20 MG tablet Commonly known as: LIPITOR Take 20 mg by mouth daily.   carvedilol 6.25 MG tablet Commonly known as: COREG Take 6.25 mg by mouth 2 (two) times daily with a meal.   ciprofloxacin 250 MG tablet Commonly known as: CIPRO Take 1 tablet (250 mg total) by mouth 2 (two) times daily for 3 days.   citalopram 10 MG tablet Commonly known as: CELEXA Take 1 tablet by mouth daily.   cyanocobalamin 1000 MCG tablet Commonly known as: VITAMIN B12 Take 1,000 mcg by mouth daily.   dicyclomine 10 MG capsule Commonly known as: BENTYL Take 10 mg by mouth 2 (two) times daily as needed.   donepezil 5 MG tablet Commonly known as: ARICEPT Take 5 mg by mouth daily.   DULoxetine 30 MG capsule Commonly known as: CYMBALTA Take 30 mg by mouth 2 (two) times daily.   memantine 10 MG tablet Commonly known as: NAMENDA Take 1 tablet by mouth 2 (two) times daily.               Durable Medical Equipment  (From admission, onward)           Start     Ordered   09/04/23 1650  For home use only DME 4 wheeled rolling walker with seat  Once       Question:  Patient needs a walker to treat with the following condition  Answer:  Unsteady gait when walking   09/04/23 1649            Follow-up Information     Enid Baas, MD Follow up in 5 day(s).   Specialty: Internal Medicine Contact information: [address] [place] [address] [phone]         Sondra Come, MD Follow up.   Specialty: Urology Why: office will call you to remove stent in 2 weeks Contact  information: [address] [place] [address] [phone]                Discharge Exam: Ceasar Mons Weights   09/03/23 0746 09/04/23 0720  Weight: 68.5 kg 67.6 kg   Physical Exam HENT:     Head: Normocephalic.     Mouth/Throat:     Pharynx: No oropharyngeal exudate.  Eyes:     General: Lids are normal.     Conjunctiva/sclera: Conjunctivae normal.  Cardiovascular:     Rate and Rhythm: Normal rate and regular rhythm.     Heart sounds: Normal heart sounds, S1 normal and S2 normal.  Pulmonary:     Breath sounds: No decreased breath sounds, wheezing, rhonchi or rales.  Abdominal:     Palpations: Abdomen is soft.     Tenderness: There is no abdominal tenderness.  Musculoskeletal:  Right lower leg: Swelling present.     Left lower leg: Swelling present.  Skin:    General: Skin is warm.     Findings: No rash.  Neurological:     Mental Status: She is alert.     Comments: Answers questions appropriately.      Condition at discharge: stable  The results of significant diagnostics from this hospitalization (including imaging, microbiology, ancillary and laboratory) are listed below for reference.   Imaging Studies: DG OR UROLOGY CYSTO IMAGE ([facility] ONLY)  Result Date: 09/04/2023 There is no interpretation for this exam.  This order is for images obtained during a surgical procedure.  Please See "Surgeries" Tab for more information regarding the procedure.   CT ABDOMEN PELVIS W CONTRAST  Result Date: 09/03/2023 CLINICAL DATA:  Abdominal pain, acute, nonlocalized. EXAM: CT ABDOMEN AND PELVIS WITH CONTRAST TECHNIQUE: Multidetector CT imaging of the abdomen and pelvis was performed using the standard protocol following bolus administration of intravenous contrast. RADIATION DOSE REDUCTION: This exam was performed according to the departmental dose-optimization program which includes automated exposure control, adjustment of the mA and/or kV according to patient size  and/or use of iterative reconstruction technique. CONTRAST:  80mL OMNIPAQUE IOHEXOL 300 MG/ML  SOLN COMPARISON:  CT abdomen/pelvis dated May 20, 2022 FINDINGS: Lower chest: No acute abnormality. There is a 5 mm posterior right lower lobe nodule and a 4 mm left lower lobe nodule (series 4, images 6 and 8), which are not significantly changed in size since the prior exam dated February 09, 2015, favoring a benign etiology and requiring no further follow-up. Bibasilar linear atelectasis or scarring. Hepatobiliary: No suspicious focal hepatic lesion. Stable subcentimeter focal hypodensity within the inferior right hepatic lobe, too small to definitively characterize. No gallstones. No gallbladder wall thickening. No biliary dilatation. Pancreas: Unremarkable. No pancreatic ductal dilatation or surrounding inflammatory changes. Spleen: Normal in size without focal abnormality. Adrenals/Urinary Tract: Stable 1.8 cm right adrenal lesion, previously characterized as an adrenal adenoma. Kidneys enhance symmetrically. The left adrenal gland is unremarkable. There is a 10 mm calculus in the proximal right ureter with moderate upstream hydroureteronephrosis. No left-sided renal calculi or hydronephrosis. The bladder is grossly unremarkable although evaluation is limited due to streak artifact from surrounding orthopedic hardware. Stomach/Bowel: Postoperative changes related to prior partial gastric resection and hiatal hernia repair. Appendix appears normal. No evidence of bowel wall thickening, distention, or focal inflammatory changes. Colonic diverticulosis without evidence of acute diverticulitis. Vascular/Lymphatic: Aortic atherosclerosis. No enlarged abdominal or pelvic lymph nodes. Reproductive: Status post hysterectomy. No adnexal masses. Other: No intra-abdominal free fluid or free air. Musculoskeletal: Status post bilateral total hip arthroplasties. Multilevel degenerative changes of the spine, most pronounced at L4-L5.  Redemonstration moderate to severe spinal canal at L2-L3. IMPRESSION: 1. 10 mm calculus in the proximal right ureter with moderate upstream hydroureteronephrosis. 2.  Aortic Atherosclerosis (ICD10-I70.0). 3. Additional unchanged ancillary findings as described above. Electronically Signed   By: Hart Robinsons M.D.   On: 09/03/2023 11:21    Microbiology: Results for orders placed or performed during the hospital encounter of 02/18/18  Surgical pcr screen     Status: None   Collection Time: 02/18/18 12:01 PM   Specimen: Nasal Mucosa; Nasal Swab  Result Value Ref Range Status   MRSA, PCR NEGATIVE NEGATIVE Final   Staphylococcus aureus NEGATIVE NEGATIVE Final    Comment: (NOTE) The Xpert SA Assay (FDA approved for NASAL specimens in patients 32 years of age and older), is one component of a  comprehensive surveillance program. It is not intended to diagnose infection nor to guide or monitor treatment. Performed at [facility], [address] [place], [place] [address]     Labs: CBC: Recent Labs  Lab 09/03/23 0750 09/04/23 0558  WBC 7.4 4.5  NEUTROABS  --  2.1  HGB 12.3 11.9*  HCT 38.5 36.5  MCV 94.8 95.1  PLT 195 164   Basic Metabolic Panel: Recent Labs  Lab 09/03/23 0750 09/04/23 0558  NA 139 136  K 5.0 4.5  CL 107 106  CO2 24 23  GLUCOSE 106* 96  BUN 31* 26*  CREATININE 1.40* 1.42*  CALCIUM 9.3 8.8*  MG  --  2.2   Liver Function Tests: Recent Labs  Lab 09/03/23 0750  AST 26  ALT 27  ALKPHOS 66  BILITOT 0.8  PROT 6.7  ALBUMIN 3.7     Discharge time spent: greater than 30 minutes.  Signed: Alford Highland, MD [facility] 09/04/2023

## 2023-09-04 NOTE — Anesthesia Preprocedure Evaluation (Addendum)
Anesthesia Evaluation  Patient identified by MRN, date of birth, ID band Patient awake    Reviewed: Allergy & Precautions, NPO status , Patient's Chart, lab work & pertinent test results  History of Anesthesia Complications Negative for: history of anesthetic complications  Airway Mallampati: III  TM Distance: >3 FB Neck ROM: full    Dental  (+) Edentulous Upper, Edentulous Lower   Pulmonary shortness of breath and with exertion, asthma , former smoker   Pulmonary exam normal        Cardiovascular hypertension, Pt. on medications Normal cardiovascular exam     Neuro/Psych  PSYCHIATRIC DISORDERS Anxiety    Dementia  Neuromuscular disease CVA    GI/Hepatic Neg liver ROS, hiatal hernia,,,  Endo/Other  negative endocrine ROS    Renal/GU Renal InsufficiencyRenal disease     Musculoskeletal  (+) Arthritis ,    Abdominal   Peds  Hematology  (+) Blood dyscrasia, anemia   Anesthesia Other Findings Past Medical History: No date: Anxiety No date: Asthma No date: Complication of anesthesia     Comment:  difficulty waking up from anesthesia No date: GI bleed No date: Hiatal hernia No date: Hypertension No date: IDA (iron deficiency anemia)  Past Surgical History: No date: ABDOMINAL HYSTERECTOMY No date: BACK SURGERY     Comment:   lumbar fusion No date: BILATERAL CARPAL TUNNEL RELEASE 01/17/2002: BREAST BIOPSY; Left No date: collapsed  lung; Left 04/28/2012: COLONOSCOPY 02-12-15: COLONOSCOPY     Comment:  Dr Lemar Livings 06/13/2015: ESOPHAGEAL MANOMETRY; N/A     Comment:  Procedure: ESOPHAGEAL MANOMETRY (EM);  Surgeon: Elnita Maxwell, MD;  Location: [facility] ENDOSCOPY;  Service:               Endoscopy;  Laterality: N/A; Oct 2016: ESOPHAGUS SURGERY     Comment:  Dr Alva Garnet in [place] No date: KNEE ARTHROSCOPY; Left 07-26-15: PARAESOPHAGEAL HERNIA REPAIR     Comment:  Dr Alva Garnet No date: PARATHYROIDECTOMY;  Left     Comment:  one gland on left removed 05/12/2017: TOTAL HIP ARTHROPLASTY; Left     Comment:  Procedure: TOTAL HIP ARTHROPLASTY ANTERIOR APPROACH;                Surgeon: Kennedy Bucker, MD;  Location: [facility] ORS;                Service: Orthopedics;  Laterality: Left; 03/04/2018: TOTAL HIP ARTHROPLASTY; Right     Comment:  Procedure: TOTAL HIP ARTHROPLASTY ANTERIOR APPROACH;                Surgeon: Kennedy Bucker, MD;  Location: [facility] ORS;                Service: Orthopedics;  Laterality: Right; 04/28/2012: UPPER GI ENDOSCOPY  BMI    Body Mass Index: 25.18 kg/m      Reproductive/Obstetrics negative OB ROS                             Anesthesia Physical Anesthesia Plan  ASA: 3  Anesthesia Plan: General ETT   Post-op Pain Management: Ofirmev IV (intra-op)* and Gabapentin PO (pre-op)*   Induction: Intravenous  PONV Risk Score and Plan: 4 or greater and Ondansetron, Dexamethasone and Treatment may vary due to age or medical condition  Airway Management Planned: Oral ETT  Additional Equipment:   Intra-op Plan:   Post-operative Plan: Extubation  in OR  Informed Consent: I have reviewed the patients History and Physical, chart, labs and discussed the procedure including the risks, benefits and alternatives for the proposed anesthesia with the patient or authorized representative who has indicated his/her understanding and acceptance.     Dental Advisory Given  Plan Discussed with: Anesthesiologist, CRNA and Surgeon  Anesthesia Plan Comments: (Patient consented for risks of anesthesia including but not limited to:  - adverse reactions to medications - damage to eyes, teeth, lips or other oral mucosa - nerve damage due to positioning  - sore throat or hoarseness - Damage to heart, brain, nerves, lungs, other parts of body or loss of life  Patient voiced understanding and assent.)        Anesthesia Quick Evaluation

## 2023-09-04 NOTE — Progress Notes (Signed)
OT Cancellation Note  Patient Details Name: Amanda [name] MRN: [id number] DOB: [birth year]   Cancelled Treatment:    Reason Eval/Treat Not Completed: Patient at procedure or test/ unavailable. Chart reviewed, pt noted to be at procedure this AM, will initiate services later this date as pt available.   Kathie Dike, M.S. OTR/L  09/04/23, 10:18 AM  ascom [phone]

## 2023-09-04 NOTE — Plan of Care (Signed)
Adequate for discharge.

## 2023-09-04 NOTE — Evaluation (Signed)
Occupational Therapy Evaluation Patient Details Name: Amanda [name] MRN: [id number] DOB: 1941-07-09 Today's Date: 09/04/2023   History of Present Illness Amanda [name] is a 82 y.o. female with medical history significant of mixed Alzheimer's and vascular dementia, CKD stage IIIb, CVA, chronic pain syndrome, chronic nephrolithiasis, hypertension, hyperlipidemia, osteoporosis, B12 deficiency, who presents to the ED due to abdominal pain and diarrhea. Found to have a 1 cm right proximal ureteral stone, urinalysis showed no evidence of infection and she opted for ureteroscopy with R ureteral stent placement.   Clinical Impression   Amanda [name] was seen for OT/PT evaluation this date. Prior to hospital admission, pt was MOD I using SPC. Pt lives alone. Pt currently requires SBA + SPC for ADL t/f improves to MOD I using RW. Educated on falls prevention strategies and DME recs. IND don/doff B socks in sitting. No skilled acute OT needs identified, will sign off. Upon hospital discharge, recommend no OT follow up.     If plan is discharge home, recommend the following: A little help with bathing/dressing/bathroom;A little help with walking and/or transfers    Functional Status Assessment  Patient has not had a recent decline in their functional status  Equipment Recommendations  Other (comment) (rollator)    Recommendations for Other Services       Precautions / Restrictions Precautions Precautions: Fall Precaution Comments: mod fall Restrictions Weight Bearing Restrictions: No      Mobility Bed Mobility               General bed mobility comments: pt received and left sitting    Transfers Overall transfer level: Needs assistance Equipment used: Straight cane Transfers: Sit to/from Stand Sit to Stand: Supervision                  Balance Overall balance assessment: Needs assistance Sitting-balance support: No upper extremity supported, Feet supported Sitting  balance-Leahy Scale: Normal     Standing balance support: No upper extremity supported, During functional activity Standing balance-Leahy Scale: Fair                             ADL either performed or assessed with clinical judgement   ADL Overall ADL's : Needs assistance/impaired                                       General ADL Comments: SBA + SPC for ADL t/f improves to MOD I using RW. IND don/doff B socks in sitting      Pertinent Vitals/Pain Pain Assessment Pain Assessment: 0-10 Pain Score: 2  Pain Location: back Pain Descriptors / Indicators: Dull Pain Intervention(s): Monitored during session     Extremity/Trunk Assessment Upper Extremity Assessment Upper Extremity Assessment: Defer to OT evaluation   Lower Extremity Assessment Lower Extremity Assessment: Overall WFL for tasks assessed   Cervical / Trunk Assessment Cervical / Trunk Assessment: Normal   Communication Communication Communication: Hearing impairment Cueing Techniques: Verbal cues   Cognition Arousal: Alert Behavior During Therapy: WFL for tasks assessed/performed Overall Cognitive Status: Within Functional Limits for tasks assessed                                                  Home Living  Family/patient expects to be discharged to:: Private residence (Simultaneous filing. User may not have seen previous data.) Living Arrangements: Alone Available Help at Discharge: Family;Available PRN/intermittently Type of Home: House Home Access: Stairs to enter [employer] of Steps: 4 Entrance Stairs-Rails: Right Home Layout: One level     Bathroom Shower/Tub: Tub/shower unit;Curtain         Home Equipment: Gilmer Mor - single point   Additional Comments: family nearby.      Prior Functioning/Environment Prior Level of Function : Independent/Modified Independent;Driving             Mobility Comments: uses SPC at baseline,  reports several falls ADLs Comments: mod I        OT Problem List: Decreased strength;Decreased range of motion;Decreased activity tolerance;Impaired balance (sitting and/or standing)         OT Goals(Current goals can be found in the care plan section) Acute Rehab OT Goals Patient Stated Goal: to go home OT Goal Formulation: With patient Time For Goal Achievement: 09/04/23 Potential to Achieve Goals: Good      Co-evaluation PT/OT/SLP Co-Evaluation/Treatment: Yes Reason for Co-Treatment: For patient/therapist safety PT goals addressed during session: Mobility/safety with mobility OT goals addressed during session: ADL's and self-care      AM-PAC OT "6 Clicks" Daily Activity     Outcome Measure Help from another person eating meals?: None Help from another person taking care of personal grooming?: A Little Help from another person toileting, which includes using toliet, bedpan, or urinal?: None Help from another person bathing (including washing, rinsing, drying)?: A Little Help from another person to put on and taking off regular upper body clothing?: None Help from another person to put on and taking off regular lower body clothing?: None 6 Click Score: 22   End of Session    Activity Tolerance: Patient tolerated treatment well Patient left: in bed;with call bell/phone within reach  OT Visit Diagnosis: Other abnormalities of gait and mobility (R26.89);Muscle weakness (generalized) (M62.81)                Time: 8469-6295 OT Time Calculation (min): 11 min Charges:  OT General Charges $OT Visit: 1 Visit OT Evaluation $OT Eval Low Complexity: 1 Low  Kathie Dike, M.S. OTR/L  09/04/23, 1:36 PM  ascom [phone]

## 2023-09-04 NOTE — Anesthesia Procedure Notes (Signed)
Procedure Name: Intubation Date/Time: 09/04/2023 7:58 AM  Performed by: Lanell Matar, CRNAPre-anesthesia Checklist: Patient identified, Emergency Drugs available, Suction available and Patient being monitored Patient Re-evaluated:Patient Re-evaluated prior to induction Oxygen Delivery Method: Circle System Utilized Preoxygenation: Pre-oxygenation with 100% oxygen Induction Type: IV induction Ventilation: Mask ventilation without difficulty Laryngoscope Size: McGraph and 4 Grade View: Grade I Tube type: Oral Tube size: 7.0 mm Number of attempts: 1 Airway Equipment and Method: Stylet and Oral airway Placement Confirmation: ETT inserted through vocal cords under direct vision, positive ETCO2 and breath sounds checked- equal and bilateral Secured at: 21 cm Tube secured with: Tape Dental Injury: Teeth and Oropharynx as per pre-operative assessment

## 2023-09-04 NOTE — Anesthesia Postprocedure Evaluation (Signed)
Anesthesia Post Note  [name]  Procedure(s) Performed: CYSTOSCOPY/URETEROSCOPY/HOLMIUM LASER/STENT PLACEMENT (Right)  Patient location during evaluation: PACU Anesthesia Type: General Level of consciousness: awake and alert Pain management: pain level controlled Vital Signs Assessment: post-procedure vital signs reviewed and stable Respiratory status: spontaneous breathing, nonlabored ventilation, respiratory function stable and patient connected to nasal cannula oxygen Cardiovascular status: blood pressure returned to baseline and stable Postop Assessment: no apparent nausea or vomiting Anesthetic complications: no   No notable events documented.   Last Vitals:  Vitals:   09/04/23 0915 09/04/23 0930  BP: 122/72 128/76  Pulse: 82 76  Resp: 13 14  Temp:    SpO2: 93% 97%    Last Pain:  Vitals:   09/04/23 0930  TempSrc:   PainSc: 0-No pain                 Louie Boston

## 2023-09-04 NOTE — Hospital Course (Signed)
82 year old female past medical history of Alzheimer's disease and vascular dementia, CKD stage IIIb, CVA, chronic pain syndrome, chronic kidney stones, hypertension, hyperlipidemia, osteoporosis, B12 deficiency presents to the ED due to abdominal pain and diarrhea.  Patient having abdominal pain and diarrhea.  CT scan of the abdomen pelvis showed 10 mm calculus in the proximal right ureter with moderate upstream hydro ureter nephrosis.  10/25.  Dr. Richardo Hanks brought to the operating room for a right stent placement and laser lithotripsy.  Dr. Richardo Hanks cleared to go home on 3 days of antibiotics.  They will call to set up appointment for removal of stent in 2 weeks.  Patient did not have any diarrhea here in the hospital.  Could be dietary related at home.  Follow-up with PCP.  Patient also complaining of chronic back pain and wanted pain medications I will have her follow-up with her PCP about this also recommended Tylenol.

## 2023-09-04 NOTE — Progress Notes (Signed)
Frank red blood noted in toilet following patient going to urinate.

## 2023-09-04 NOTE — Evaluation (Signed)
Physical Therapy Evaluation Patient Details Name: Amanda [name] MRN: [id number] DOB: 02/26/1941 Today's Date: 09/04/2023  History of Present Illness  Amanda [name] is a 82 y.o. female with medical history significant of mixed Alzheimer's and vascular dementia, CKD stage IIIb, CVA, chronic pain syndrome, chronic nephrolithiasis, hypertension, hyperlipidemia, osteoporosis, B12 deficiency, who presents to the ED due to abdominal pain and diarrhea. Found to have a 1 cm right proximal ureteral stone, urinalysis showed no evidence of infection and she opted for ureteroscopy with R ureteral stent placement.  Clinical Impression  Patient received sitting up on side of bed on phone. She is agreeable to PT assessment. Patient stands with cga. She came in with [facility], reports she would like a 4 wheeled walker. Reports multiple falls. Patient ambulates with RW and supervision 150 feet.  Improved safety and mobility using RW. She will continue to benefit from skilled PT follow up while here to ensure safe use of AD.         If plan is discharge home, recommend the following: A little help with walking and/or transfers;A little help with bathing/dressing/bathroom;Assist for transportation;Help with stairs or ramp for entrance   Can travel by private vehicle    yes    Equipment Recommendations Rollator (4 wheels)  Recommendations for Other Services       Functional Status Assessment Patient has had a recent decline in their functional status and demonstrates the ability to make significant improvements in function in a reasonable and predictable amount of time.     Precautions / Restrictions Precautions Precautions: Fall Precaution Comments: mod fall Restrictions Weight Bearing Restrictions: No      Mobility  Bed Mobility Overal bed mobility: Modified Independent             General bed mobility comments: patient received sitting up on side of bed    Transfers Overall transfer level:  Modified independent Equipment used: Rolling walker (2 wheels)                    Ambulation/Gait Ambulation/Gait assistance: Supervision Gait Distance (Feet): 150 Feet Assistive device: Rolling walker (2 wheels) Gait Pattern/deviations: Trunk flexed, Step-through pattern, Decreased step length - right, Decreased step length - left, Decreased stride length Gait velocity: decr     General Gait Details: patient requires cues for upright posture with RW. Attempted to ambulate some with SPC, not steady and safe at this time with [facility].  Stairs            Wheelchair Mobility     Tilt Bed    Modified Rankin (Stroke Patients Only)       Balance Overall balance assessment: Modified Independent                                           Pertinent Vitals/Pain Pain Assessment Pain Assessment: Faces Faces Pain Scale: Hurts a little bit Pain Location: back, hips ( chronic) Pain Descriptors / Indicators: Sore Pain Intervention(s): Monitored during session    Home Living Family/patient expects to be discharged to:: Private residence (Simultaneous filing. User may not have seen previous data.) Living Arrangements: Alone Available Help at Discharge: Family;Available PRN/intermittently Type of Home: House Home Access: Stairs to enter Entrance Stairs-Rails: Right Entrance Stairs-Number of Steps: 4   Home Layout: One level Home Equipment: Gilmer Mor - single point Additional Comments: family nearby.    Prior Function Prior  Level of Function : Independent/Modified Independent;Driving             Mobility Comments: uses SPC at baseline, reports several falls ADLs Comments: mod I     Extremity/Trunk Assessment   Upper Extremity Assessment Upper Extremity Assessment: Defer to OT evaluation    Lower Extremity Assessment Lower Extremity Assessment: Overall WFL for tasks assessed    Cervical / Trunk Assessment Cervical / Trunk Assessment: Normal   Communication   Communication Communication: Hearing impairment Cueing Techniques: Verbal cues  Cognition Arousal: Alert Behavior During Therapy: WFL for tasks assessed/performed Overall Cognitive Status: Within Functional Limits for tasks assessed                                          General Comments      Exercises     Assessment/Plan    PT Assessment Patient needs continued PT services  PT Problem List Decreased strength;Decreased activity tolerance;Decreased mobility;Decreased knowledge of use of DME       PT Treatment Interventions DME instruction;Gait training;Stair training;Functional mobility training;Therapeutic activities;Therapeutic exercise;Patient/family education    PT Goals (Current goals can be found in the Care Plan section)  Acute Rehab PT Goals Patient Stated Goal: return home PT Goal Formulation: With patient Time For Goal Achievement: 09/10/23 Potential to Achieve Goals: Good    Frequency Min 1X/week     Co-evaluation   Reason for Co-Treatment: For patient/therapist safety PT goals addressed during session: Mobility/safety with mobility OT goals addressed during session: ADL's and self-care       AM-PAC PT "6 Clicks" Mobility  Outcome Measure Help needed turning from your back to your side while in a flat bed without using bedrails?: None Help needed moving from lying on your back to sitting on the side of a flat bed without using bedrails?: None Help needed moving to and from a bed to a chair (including a wheelchair)?: A Little Help needed standing up from a chair using your arms (e.g., wheelchair or bedside chair)?: None Help needed to walk in hospital room?: A Little Help needed climbing 3-5 steps with a railing? : A Little 6 Click Score: 21    End of Session   Activity Tolerance: Patient tolerated treatment well Patient left: in bed;with call bell/phone within reach Nurse Communication: Mobility status PT Visit  Diagnosis: Muscle weakness (generalized) (M62.81);Difficulty in walking, not elsewhere classified (R26.2)    Time: 2130-8657 PT Time Calculation (min) (ACUTE ONLY): 13 min   Charges:   PT Evaluation $PT Eval Low Complexity: 1 Low   PT General Charges $$ ACUTE PT VISIT: 1 Visit         Elaura Calix, PT, GCS 09/04/23,1:24 PM

## 2023-09-07 NOTE — TOC CM/SW Note (Signed)
Late entry Rolator order was sent to Jon with [employer] and was delivered to room prior to discharge on 10/25

## 2023-09-17 ENCOUNTER — Ambulatory Visit: Payer: Medicare Other | Admitting: Urology

## 2023-09-17 ENCOUNTER — Encounter: Payer: Self-pay | Admitting: Oncology

## 2023-09-17 VITALS — Ht 64.5 in | Wt 178.0 lb

## 2023-09-17 DIAGNOSIS — Z466 Encounter for fitting and adjustment of urinary device: Secondary | ICD-10-CM

## 2023-09-17 MED ORDER — SULFAMETHOXAZOLE-TRIMETHOPRIM 800-160 MG PO TABS
1.0000 | ORAL_TABLET | Freq: Once | ORAL | Status: AC
  Administered 2023-09-17: 1 via ORAL

## 2023-09-17 NOTE — Progress Notes (Signed)
Cystoscopy Procedure Note:  Indication: Stent removal s/p 09/04/2023 right ureteroscopy for 1 cm right proximal ureteral stone  Bactrim given for prophylaxis  After informed consent and discussion of the procedure and its risks, Marciana Uplinger was positioned and prepped in the standard fashion. Cystoscopy was performed with a flexible cystoscope. The stent was grasped with flexible graspers and removed in its entirety. The patient tolerated the procedure well.  Findings: Uncomplicated stent removal  Assessment and Plan: Follow-up with urology as needed  Sondra Come, MD 09/17/2023

## 2023-10-29 DIAGNOSIS — Z23 Encounter for immunization: Secondary | ICD-10-CM | POA: Diagnosis not present

## 2023-10-29 DIAGNOSIS — M810 Age-related osteoporosis without current pathological fracture: Secondary | ICD-10-CM | POA: Diagnosis not present

## 2023-10-29 DIAGNOSIS — M1990 Unspecified osteoarthritis, unspecified site: Secondary | ICD-10-CM | POA: Diagnosis not present

## 2023-10-29 DIAGNOSIS — E559 Vitamin D deficiency, unspecified: Secondary | ICD-10-CM | POA: Diagnosis not present

## 2023-10-29 DIAGNOSIS — Z139 Encounter for screening, unspecified: Secondary | ICD-10-CM | POA: Diagnosis not present

## 2023-10-29 DIAGNOSIS — I10 Essential (primary) hypertension: Secondary | ICD-10-CM | POA: Diagnosis not present

## 2023-10-29 DIAGNOSIS — E7800 Pure hypercholesterolemia, unspecified: Secondary | ICD-10-CM | POA: Diagnosis not present

## 2023-10-29 DIAGNOSIS — Z79899 Other long term (current) drug therapy: Secondary | ICD-10-CM | POA: Diagnosis not present

## 2023-10-29 DIAGNOSIS — E538 Deficiency of other specified B group vitamins: Secondary | ICD-10-CM | POA: Diagnosis not present

## 2023-10-29 DIAGNOSIS — K580 Irritable bowel syndrome with diarrhea: Secondary | ICD-10-CM | POA: Diagnosis not present

## 2023-12-27 ENCOUNTER — Inpatient Hospital Stay
Admission: EM | Admit: 2023-12-27 | Discharge: 2024-01-04 | DRG: 189 | Disposition: A | Discharge status: Home Health | Payer: Medicare Other | Attending: Internal Medicine | Admitting: Internal Medicine

## 2023-12-27 ENCOUNTER — Emergency Department: Payer: Medicare Other

## 2023-12-27 DIAGNOSIS — G9341 Metabolic encephalopathy: Secondary | ICD-10-CM | POA: Diagnosis not present

## 2023-12-27 DIAGNOSIS — F0390 Unspecified dementia without behavioral disturbance: Secondary | ICD-10-CM | POA: Diagnosis not present

## 2023-12-27 DIAGNOSIS — Z1152 Encounter for screening for COVID-19: Secondary | ICD-10-CM | POA: Diagnosis not present

## 2023-12-27 DIAGNOSIS — I129 Hypertensive chronic kidney disease with stage 1 through stage 4 chronic kidney disease, or unspecified chronic kidney disease: Secondary | ICD-10-CM | POA: Diagnosis present

## 2023-12-27 DIAGNOSIS — R6889 Other general symptoms and signs: Secondary | ICD-10-CM | POA: Diagnosis not present

## 2023-12-27 DIAGNOSIS — R062 Wheezing: Secondary | ICD-10-CM | POA: Diagnosis not present

## 2023-12-27 DIAGNOSIS — Z96643 Presence of artificial hip joint, bilateral: Secondary | ICD-10-CM | POA: Diagnosis present

## 2023-12-27 DIAGNOSIS — Z884 Allergy status to anesthetic agent status: Secondary | ICD-10-CM | POA: Diagnosis not present

## 2023-12-27 DIAGNOSIS — R410 Disorientation, unspecified: Secondary | ICD-10-CM | POA: Diagnosis not present

## 2023-12-27 DIAGNOSIS — Z87891 Personal history of nicotine dependence: Secondary | ICD-10-CM

## 2023-12-27 DIAGNOSIS — R4182 Altered mental status, unspecified: Secondary | ICD-10-CM

## 2023-12-27 DIAGNOSIS — Z885 Allergy status to narcotic agent status: Secondary | ICD-10-CM | POA: Diagnosis not present

## 2023-12-27 DIAGNOSIS — I10 Essential (primary) hypertension: Secondary | ICD-10-CM | POA: Diagnosis present

## 2023-12-27 DIAGNOSIS — E872 Acidosis, unspecified: Secondary | ICD-10-CM | POA: Diagnosis not present

## 2023-12-27 DIAGNOSIS — E785 Hyperlipidemia, unspecified: Secondary | ICD-10-CM | POA: Diagnosis not present

## 2023-12-27 DIAGNOSIS — J9601 Acute respiratory failure with hypoxia: Secondary | ICD-10-CM | POA: Diagnosis not present

## 2023-12-27 DIAGNOSIS — J205 Acute bronchitis due to respiratory syncytial virus: Principal | ICD-10-CM | POA: Diagnosis present

## 2023-12-27 DIAGNOSIS — Z880 Allergy status to penicillin: Secondary | ICD-10-CM | POA: Diagnosis not present

## 2023-12-27 DIAGNOSIS — N1831 Chronic kidney disease, stage 3a: Secondary | ICD-10-CM | POA: Diagnosis present

## 2023-12-27 DIAGNOSIS — R404 Transient alteration of awareness: Secondary | ICD-10-CM | POA: Diagnosis not present

## 2023-12-27 DIAGNOSIS — Z7982 Long term (current) use of aspirin: Secondary | ICD-10-CM

## 2023-12-27 DIAGNOSIS — E66811 Obesity, class 1: Secondary | ICD-10-CM | POA: Diagnosis present

## 2023-12-27 DIAGNOSIS — E860 Dehydration: Secondary | ICD-10-CM

## 2023-12-27 DIAGNOSIS — I959 Hypotension, unspecified: Secondary | ICD-10-CM

## 2023-12-27 DIAGNOSIS — I517 Cardiomegaly: Secondary | ICD-10-CM | POA: Diagnosis not present

## 2023-12-27 DIAGNOSIS — N189 Chronic kidney disease, unspecified: Secondary | ICD-10-CM

## 2023-12-27 DIAGNOSIS — K449 Diaphragmatic hernia without obstruction or gangrene: Secondary | ICD-10-CM | POA: Diagnosis not present

## 2023-12-27 DIAGNOSIS — E871 Hypo-osmolality and hyponatremia: Secondary | ICD-10-CM | POA: Diagnosis not present

## 2023-12-27 DIAGNOSIS — Z90710 Acquired absence of both cervix and uterus: Secondary | ICD-10-CM

## 2023-12-27 DIAGNOSIS — J45909 Unspecified asthma, uncomplicated: Secondary | ICD-10-CM | POA: Diagnosis not present

## 2023-12-27 DIAGNOSIS — R0689 Other abnormalities of breathing: Secondary | ICD-10-CM | POA: Diagnosis not present

## 2023-12-27 DIAGNOSIS — N179 Acute kidney failure, unspecified: Secondary | ICD-10-CM

## 2023-12-27 DIAGNOSIS — F0392 Unspecified dementia, unspecified severity, with psychotic disturbance: Secondary | ICD-10-CM | POA: Diagnosis present

## 2023-12-27 DIAGNOSIS — Z6830 Body mass index (BMI) 30.0-30.9, adult: Secondary | ICD-10-CM

## 2023-12-27 LAB — COMPREHENSIVE METABOLIC PANEL
ALT: 11 U/L (ref 0–44)
AST: 18 U/L (ref 15–41)
Albumin: 3.2 g/dL — ABNORMAL LOW (ref 3.5–5.0)
Alkaline Phosphatase: 62 U/L (ref 38–126)
Anion gap: 12 (ref 5–15)
BUN: 51 mg/dL — ABNORMAL HIGH (ref 8–23)
CO2: 22 mmol/L (ref 22–32)
Calcium: 8.5 mg/dL — ABNORMAL LOW (ref 8.9–10.3)
Chloride: 96 mmol/L — ABNORMAL LOW (ref 98–111)
Creatinine, Ser: 1.92 mg/dL — ABNORMAL HIGH (ref 0.44–1.00)
GFR, Estimated: 25 mL/min — ABNORMAL LOW (ref >60)
Glucose, Bld: 107 mg/dL — ABNORMAL HIGH (ref 70–99)
Potassium: 4.8 mmol/L (ref 3.5–5.1)
Sodium: 130 mmol/L — ABNORMAL LOW (ref 135–145)
Total Bilirubin: 1.3 mg/dL — ABNORMAL HIGH (ref 0.0–1.2)
Total Protein: 6.7 g/dL (ref 6.5–8.1)

## 2023-12-27 LAB — CBC WITH DIFFERENTIAL/PLATELET
Abs Immature Granulocytes: 0.06 10*3/uL (ref 0.00–0.07)
Basophils Absolute: 0 10*3/uL (ref 0.0–0.1)
Basophils Relative: 1 %
Eosinophils Absolute: 0 10*3/uL (ref 0.0–0.5)
Eosinophils Relative: 0 %
HCT: 34.7 % — ABNORMAL LOW (ref 36.0–46.0)
Hemoglobin: 11.5 g/dL — ABNORMAL LOW (ref 12.0–15.0)
Immature Granulocytes: 1 %
Lymphocytes Relative: 17 %
Lymphs Abs: 1.4 10*3/uL (ref 0.7–4.0)
MCH: 29.4 pg (ref 26.0–34.0)
MCHC: 33.1 g/dL (ref 30.0–36.0)
MCV: 88.7 fL (ref 80.0–100.0)
Monocytes Absolute: 0.8 10*3/uL (ref 0.1–1.0)
Monocytes Relative: 9 %
Neutro Abs: 6.3 10*3/uL (ref 1.7–7.7)
Neutrophils Relative %: 72 %
Platelets: 208 10*3/uL (ref 150–400)
RBC: 3.91 MIL/uL (ref 3.87–5.11)
RDW: 13.9 % (ref 11.5–15.5)
WBC: 8.6 10*3/uL (ref 4.0–10.5)
nRBC: 0 % (ref 0.0–0.2)

## 2023-12-27 LAB — URINALYSIS, W/ REFLEX TO CULTURE (INFECTION SUSPECTED)
Bilirubin Urine: NEGATIVE
Glucose, UA: NEGATIVE mg/dL
Hgb urine dipstick: NEGATIVE
Ketones, ur: NEGATIVE mg/dL
Leukocytes,Ua: NEGATIVE
Nitrite: NEGATIVE
Protein, ur: 30 mg/dL — AB
Specific Gravity, Urine: 1.015 (ref 1.005–1.030)
pH: 5 (ref 5.0–8.0)

## 2023-12-27 LAB — BLOOD GAS, VENOUS
Acid-base deficit: 2.8 mmol/L — ABNORMAL HIGH (ref 0.0–2.0)
Bicarbonate: 23.2 mmol/L (ref 20.0–28.0)
O2 Saturation: 87.8 %
Patient temperature: 37
pCO2, Ven: 44 mm[Hg] (ref 44–60)
pH, Ven: 7.33 (ref 7.25–7.43)
pO2, Ven: 55 mm[Hg] — ABNORMAL HIGH (ref 32–45)

## 2023-12-27 LAB — RESP PANEL BY RT-PCR (RSV, FLU A&B, COVID)  RVPGX2
Influenza A by PCR: NEGATIVE
Influenza B by PCR: NEGATIVE
Resp Syncytial Virus by PCR: POSITIVE — AB
SARS Coronavirus 2 by RT PCR: NEGATIVE

## 2023-12-27 MED ORDER — LACTATED RINGERS IV BOLUS
1000.0000 mL | Freq: Once | INTRAVENOUS | Status: AC
  Administered 2023-12-27: 1000 mL via INTRAVENOUS

## 2023-12-27 MED ORDER — LACTATED RINGERS IV BOLUS: 1000.0000 mL | Freq: Once | INTRAVENOUS | Status: DC

## 2023-12-27 NOTE — ED Provider Notes (Signed)
 [facility] Provider Note    Event Date/Time   First MD Initiated Contact with Patient 12/27/23 2001     (approximate)   History   Altered Mental Status   HPI  Amanda [name] is a 83 y.o. female who presents to the emergency department today because of concerns for altered mental status.  Patient herself cannot give any history.  Per report patient has history of dementia although unclear mental baseline. Per report when first responders arrived on scene the patient was hypoxic.  They did put the patient on supplemental oxygen.  Additionally the patient was given magnesium, Solu-Medrol and breathing treatments.  EMS stated that she does appear more comfortable with her breathing after these treatments.     Physical Exam   Triage Vital Signs: ED Triage Vitals  Encounter Vitals Group     BP 12/27/23 2004 (!) 114/98     Systolic BP Percentile --      Diastolic BP Percentile --      Pulse Rate 12/27/23 2004 60     Resp 12/27/23 2004 (!) 24     Temp 12/27/23 2004 98.3 F (36.8 C)     Temp Source 12/27/23 2004 Oral     SpO2 12/27/23 2004 97 %     Weight --      Height --      Head Circumference --      Peak Flow --      Pain Score 12/27/23 2016 0     Pain Loc --      Pain Education --      Exclude from Growth Chart --     Most recent vital signs: Vitals:   12/27/23 2004 12/27/23 2018  BP: (!) 114/98   Pulse: 60   Resp: (!) 24   Temp: 98.3 F (36.8 C)   SpO2: 97% 90%   General: Awake, alert, not oriented. CV:  Good peripheral perfusion. Regular rate and rhythm. Resp:  Increased work of breathing. Diffuse expiratory wheezing. Abd:  No distention.    ED Results / Procedures / Treatments   Labs (all labs ordered are listed, but only abnormal results are displayed) Labs Reviewed  RESP PANEL BY RT-PCR (RSV, FLU A&B, COVID)  RVPGX2 - Abnormal; Notable for the following components:      Result Value   Resp Syncytial Virus by PCR POSITIVE  (*)    All other components within normal limits  COMPREHENSIVE METABOLIC PANEL - Abnormal; Notable for the following components:   Sodium 130 (*)    Chloride 96 (*)    Glucose, Bld 107 (*)    BUN 51 (*)    Creatinine, Ser 1.92 (*)    Calcium 8.5 (*)    Albumin 3.2 (*)    Total Bilirubin 1.3 (*)    GFR, Estimated 25 (*)    All other components within normal limits  CBC WITH DIFFERENTIAL/PLATELET - Abnormal; Notable for the following components:   Hemoglobin 11.5 (*)    HCT 34.7 (*)    All other components within normal limits  URINALYSIS, W/ REFLEX TO CULTURE (INFECTION SUSPECTED) - Abnormal; Notable for the following components:   Color, Urine AMBER (*)    APPearance CLEAR (*)    Protein, ur 30 (*)    Bacteria, UA RARE (*)    All other components within normal limits  BLOOD GAS, VENOUS - Abnormal; Notable for the following components:   pO2, Ven 55 (*)    Acid-base deficit  2.8 (*)    All other components within normal limits  CULTURE, BLOOD (ROUTINE X 2)  CULTURE, BLOOD (ROUTINE X 2)     EKG  I, Phineas Semen, attending physician, personally viewed and interpreted this EKG  EKG Time: 2024 Rate: 63 Rhythm: sinus rhythm Axis: normal Intervals: qtc 423 QRS: narrow, q waves III ST changes: no st elevation Impression: abnormal ekg    RADIOLOGY I independently interpreted and visualized the CXR. My interpretation: No pneumonia Radiology interpretation:  IMPRESSION:  1. No acute chest findings.  2. Stable cardiomegaly.  3. Hiatal hernia.      PROCEDURES:  Critical Care performed: Yes  CRITICAL CARE Performed by: Phineas Semen   Total critical care time: 30 minutes  Critical care time was exclusive of separately billable procedures and treating other patients.  Critical care was necessary to treat or prevent imminent or life-threatening deterioration.  Critical care was time spent personally by me on the following activities: development of  treatment plan with patient and/or surrogate as well as nursing, discussions with consultants, evaluation of patient's response to treatment, examination of patient, obtaining history from patient or surrogate, ordering and performing treatments and interventions, ordering and review of laboratory studies, ordering and review of radiographic studies, pulse oximetry and re-evaluation of patient's condition.   Procedures    MEDICATIONS ORDERED IN ED: Medications  lactated ringers bolus 1,000 mL (1,000 mLs Intravenous New Bag/Given 12/27/23 2035)     IMPRESSION / MDM / ASSESSMENT AND PLAN / ED COURSE  I reviewed the triage vital signs and the nursing notes.                              Differential diagnosis includes, but is not limited to, ICH, infection, anemia, electrolyte abnormality  Patient's presentation is most consistent with acute presentation with potential threat to life or bodily function.   The patient is on the cardiac monitor to evaluate for evidence of arrhythmia and/or significant heart rate changes.  Patient presents to the emergency department today because of concerns for altered mental status.  Patient was found to be hypoxic by EMS.  On exam patient does have increased work of breathing.  Will continue supplemental oxygen.  Will initiate broad workup.  Patient was afebrile.  Patient's blood pressure was found to be low.  Will give further IV fluids here in the emergency department.  Patient's blood pressure did start to improve after IV fluids.  Patient did test positive for RSV which I think would explain the patient's hypoxia.  Awaiting urine at time of signout.  Patient will require admission.    FINAL CLINICAL IMPRESSION(S) / ED DIAGNOSES   Final diagnoses:  Acute respiratory failure with hypoxia (HCC)  Confusion  Acute renal failure superimposed on chronic kidney disease, unspecified acute renal failure type, unspecified CKD stage (HCC)  Dehydration   Hypotension, unspecified hypotension type  Altered mental status, unspecified altered mental status type     Note:  This document was prepared using Dragon voice recognition software and may include unintentional dictation errors.    Phineas Semen, MD 12/27/23 [phone]

## 2023-12-27 NOTE — ED Notes (Signed)
 Spoke with pt's family via telephone about pt's status and plan of care.

## 2023-12-27 NOTE — ED Provider Notes (Signed)
-----------------------------------------   11:16 PM on 12/27/2023 -----------------------------------------  Assuming care from Dr. Derrill Kay.  In short, Amanda [name] is a 83 y.o. female with a chief complaint of AMS and respiratory failure.  Refer to the original H&P for additional details.  The current plan of care is to awaiting VBG and UA, eval for improvement of BP after fluids.  Patient requires admission.   Clinical Course as of 12/28/23 0024  Amanda [name] Dec 27, 2023  2326 pCO2, Ven: 44 Reassuring, no hypercapnea [CF]  Mon Dec 28, 2023  0023 Consulted by phone with Dr. Debby Bud with [facility].  We discussed the case and he will admit the patient [CF]    Clinical Course User Index [CF] Loleta Rose, MD     Medications  lactated ringers bolus 1,000 mL (0 mLs Intravenous Stopped 12/27/23 2152)  lactated ringers bolus 1,000 mL (0 mLs Intravenous Stopped 12/27/23 2303)     ED Discharge Orders     None      Final diagnoses:  Acute respiratory failure with hypoxia (HCC)  Confusion  Acute renal failure superimposed on chronic kidney disease, unspecified acute renal failure type, unspecified CKD stage (HCC)  Dehydration  Hypotension, unspecified hypotension type  Altered mental status, unspecified altered mental status type     Loleta Rose, MD 12/28/23 [phone]

## 2023-12-27 NOTE — ED Triage Notes (Signed)
 Pt BIB [facility] for initial call of AMS, but upon arrival pt was found to have O2 sat of 80% RA and hypotensive at 70/30. Pt has wheezing throughout all lung fields and has hx of dementia. Unsure of baseline cognition. Pt lives at home with family. Pt doesn't answer any questions during triage assessment.

## 2023-12-28 ENCOUNTER — Other Ambulatory Visit: Payer: Self-pay

## 2023-12-28 ENCOUNTER — Encounter: Payer: Self-pay | Admitting: Internal Medicine

## 2023-12-28 DIAGNOSIS — Z6830 Body mass index (BMI) 30.0-30.9, adult: Secondary | ICD-10-CM | POA: Diagnosis not present

## 2023-12-28 DIAGNOSIS — Z884 Allergy status to anesthetic agent status: Secondary | ICD-10-CM | POA: Diagnosis not present

## 2023-12-28 DIAGNOSIS — Z880 Allergy status to penicillin: Secondary | ICD-10-CM | POA: Diagnosis not present

## 2023-12-28 DIAGNOSIS — Z87891 Personal history of nicotine dependence: Secondary | ICD-10-CM | POA: Diagnosis not present

## 2023-12-28 DIAGNOSIS — F0390 Unspecified dementia without behavioral disturbance: Secondary | ICD-10-CM | POA: Diagnosis not present

## 2023-12-28 DIAGNOSIS — J9601 Acute respiratory failure with hypoxia: Secondary | ICD-10-CM | POA: Diagnosis not present

## 2023-12-28 DIAGNOSIS — E872 Acidosis, unspecified: Secondary | ICD-10-CM | POA: Diagnosis present

## 2023-12-28 DIAGNOSIS — N1831 Chronic kidney disease, stage 3a: Secondary | ICD-10-CM | POA: Diagnosis not present

## 2023-12-28 DIAGNOSIS — J205 Acute bronchitis due to respiratory syncytial virus: Secondary | ICD-10-CM | POA: Diagnosis present

## 2023-12-28 DIAGNOSIS — E66811 Obesity, class 1: Secondary | ICD-10-CM | POA: Diagnosis present

## 2023-12-28 DIAGNOSIS — E860 Dehydration: Secondary | ICD-10-CM | POA: Diagnosis present

## 2023-12-28 DIAGNOSIS — E871 Hypo-osmolality and hyponatremia: Secondary | ICD-10-CM | POA: Diagnosis present

## 2023-12-28 DIAGNOSIS — J40 Bronchitis, not specified as acute or chronic: Secondary | ICD-10-CM | POA: Diagnosis not present

## 2023-12-28 DIAGNOSIS — F0392 Unspecified dementia, unspecified severity, with psychotic disturbance: Secondary | ICD-10-CM | POA: Diagnosis present

## 2023-12-28 DIAGNOSIS — Z7982 Long term (current) use of aspirin: Secondary | ICD-10-CM | POA: Diagnosis not present

## 2023-12-28 DIAGNOSIS — Z885 Allergy status to narcotic agent status: Secondary | ICD-10-CM | POA: Diagnosis not present

## 2023-12-28 DIAGNOSIS — J45909 Unspecified asthma, uncomplicated: Secondary | ICD-10-CM | POA: Diagnosis present

## 2023-12-28 DIAGNOSIS — Z90710 Acquired absence of both cervix and uterus: Secondary | ICD-10-CM | POA: Diagnosis not present

## 2023-12-28 DIAGNOSIS — I129 Hypertensive chronic kidney disease with stage 1 through stage 4 chronic kidney disease, or unspecified chronic kidney disease: Secondary | ICD-10-CM | POA: Diagnosis present

## 2023-12-28 DIAGNOSIS — E785 Hyperlipidemia, unspecified: Secondary | ICD-10-CM | POA: Diagnosis present

## 2023-12-28 DIAGNOSIS — J410 Simple chronic bronchitis: Secondary | ICD-10-CM | POA: Diagnosis not present

## 2023-12-28 DIAGNOSIS — Z96643 Presence of artificial hip joint, bilateral: Secondary | ICD-10-CM | POA: Diagnosis present

## 2023-12-28 DIAGNOSIS — Z1152 Encounter for screening for COVID-19: Secondary | ICD-10-CM | POA: Diagnosis not present

## 2023-12-28 DIAGNOSIS — G9341 Metabolic encephalopathy: Secondary | ICD-10-CM | POA: Diagnosis not present

## 2023-12-28 LAB — BASIC METABOLIC PANEL
Anion gap: 15 (ref 5–15)
BUN: 51 mg/dL — ABNORMAL HIGH (ref 8–23)
CO2: 20 mmol/L — ABNORMAL LOW (ref 22–32)
Calcium: 8.1 mg/dL — ABNORMAL LOW (ref 8.9–10.3)
Chloride: 97 mmol/L — ABNORMAL LOW (ref 98–111)
Creatinine, Ser: 1.78 mg/dL — ABNORMAL HIGH (ref 0.44–1.00)
GFR, Estimated: 28 mL/min — ABNORMAL LOW (ref >60)
Glucose, Bld: 140 mg/dL — ABNORMAL HIGH (ref 70–99)
Potassium: 4.4 mmol/L (ref 3.5–5.1)
Sodium: 132 mmol/L — ABNORMAL LOW (ref 135–145)

## 2023-12-28 LAB — CBC
HCT: 34.4 % — ABNORMAL LOW (ref 36.0–46.0)
Hemoglobin: 11.2 g/dL — ABNORMAL LOW (ref 12.0–15.0)
MCH: 29.5 pg (ref 26.0–34.0)
MCHC: 32.6 g/dL (ref 30.0–36.0)
MCV: 90.5 fL (ref 80.0–100.0)
Platelets: 174 10*3/uL (ref 150–400)
RBC: 3.8 MIL/uL — ABNORMAL LOW (ref 3.87–5.11)
RDW: 13.8 % (ref 11.5–15.5)
WBC: 8.5 10*3/uL (ref 4.0–10.5)
nRBC: 0 % (ref 0.0–0.2)

## 2023-12-28 MED ORDER — ASPIRIN 81 MG PO TBEC
81.0000 mg | DELAYED_RELEASE_TABLET | Freq: Every day | ORAL | Status: DC
  Administered 2023-12-28 – 2024-01-04 (×8): 81 mg via ORAL
  Filled 2023-12-28 (×8): qty 1

## 2023-12-28 MED ORDER — MEMANTINE HCL 5 MG PO TABS
10.0000 mg | ORAL_TABLET | Freq: Two times a day (BID) | ORAL | Status: DC
Start: 2023-12-28 — End: 2024-01-04
  Administered 2023-12-28 – 2024-01-04 (×14): 10 mg via ORAL
  Filled 2023-12-28 (×14): qty 2

## 2023-12-28 MED ORDER — SODIUM CHLORIDE 0.45 % IV SOLN: INTRAVENOUS | Status: DC

## 2023-12-28 MED ORDER — ENOXAPARIN SODIUM 30 MG/0.3ML IJ SOSY
30.0000 mg | PREFILLED_SYRINGE | INTRAMUSCULAR | Status: DC
  Administered 2023-12-28 – 2023-12-30 (×3): 30 mg via SUBCUTANEOUS
  Filled 2023-12-28 (×3): qty 0.3

## 2023-12-28 MED ORDER — HALOPERIDOL LACTATE 5 MG/ML IJ SOLN
1.0000 mg | Freq: Once | INTRAMUSCULAR | Status: AC
  Administered 2023-12-28: 1 mg via INTRAVENOUS
  Filled 2023-12-28: qty 1

## 2023-12-28 MED ORDER — DULOXETINE HCL 30 MG PO CPEP
30.0000 mg | ORAL_CAPSULE | Freq: Two times a day (BID) | ORAL | Status: DC
  Administered 2023-12-28 – 2024-01-04 (×14): 30 mg via ORAL
  Filled 2023-12-28 (×14): qty 1

## 2023-12-28 MED ORDER — ENOXAPARIN SODIUM 30 MG/0.3ML IJ SOSY: 30.0000 mg | PREFILLED_SYRINGE | INTRAMUSCULAR | Status: DC

## 2023-12-28 MED ORDER — ALBUTEROL SULFATE (2.5 MG/3ML) 0.083% IN NEBU
2.5000 mg | INHALATION_SOLUTION | Freq: Four times a day (QID) | RESPIRATORY_TRACT | Status: DC
  Administered 2023-12-28 – 2023-12-29 (×5): 2.5 mg via RESPIRATORY_TRACT
  Filled 2023-12-28 (×4): qty 3

## 2023-12-28 MED ORDER — CITALOPRAM HYDROBROMIDE 20 MG PO TABS
10.0000 mg | ORAL_TABLET | Freq: Every day | ORAL | Status: DC
  Administered 2023-12-28 – 2024-01-04 (×8): 10 mg via ORAL
  Filled 2023-12-28 (×8): qty 1

## 2023-12-28 MED ORDER — SENNA 8.6 MG PO TABS
1.0000 | ORAL_TABLET | Freq: Two times a day (BID) | ORAL | Status: DC
  Administered 2023-12-28 – 2024-01-03 (×11): 8.6 mg via ORAL
  Filled 2023-12-28 (×14): qty 1

## 2023-12-28 MED ORDER — BUDESONIDE 0.5 MG/2ML IN SUSP
0.5000 mg | Freq: Two times a day (BID) | RESPIRATORY_TRACT | Status: AC
  Administered 2023-12-28 – 2023-12-31 (×8): 0.5 mg via RESPIRATORY_TRACT
  Filled 2023-12-28 (×8): qty 2

## 2023-12-28 MED ORDER — NYSTATIN 100000 UNIT/GM EX POWD
Freq: Once | CUTANEOUS | Status: AC
  Filled 2023-12-28: qty 15

## 2023-12-28 MED ORDER — CARVEDILOL 6.25 MG PO TABS
6.2500 mg | ORAL_TABLET | Freq: Two times a day (BID) | ORAL | Status: DC
  Administered 2023-12-28 – 2024-01-03 (×14): 6.25 mg via ORAL
  Filled 2023-12-28 (×15): qty 1

## 2023-12-28 MED ORDER — ACETAMINOPHEN 650 MG RE SUPP: 650.0000 mg | Freq: Four times a day (QID) | RECTAL | Status: DC | PRN

## 2023-12-28 MED ORDER — DONEPEZIL HCL 5 MG PO TABS
5.0000 mg | ORAL_TABLET | Freq: Every day | ORAL | Status: DC
  Administered 2023-12-28 – 2024-01-04 (×8): 5 mg via ORAL
  Filled 2023-12-28 (×8): qty 1

## 2023-12-28 MED ORDER — IPRATROPIUM-ALBUTEROL 0.5-2.5 (3) MG/3ML IN SOLN
3.0000 mL | Freq: Once | RESPIRATORY_TRACT | Status: AC
  Administered 2023-12-28: 3 mL via RESPIRATORY_TRACT
  Filled 2023-12-28: qty 3

## 2023-12-28 MED ORDER — GUAIFENESIN 100 MG/5ML PO LIQD: 15.0000 mL | ORAL | Status: DC | PRN

## 2023-12-28 MED ORDER — ACETAMINOPHEN 325 MG PO TABS
650.0000 mg | ORAL_TABLET | Freq: Four times a day (QID) | ORAL | Status: DC | PRN
  Administered 2023-12-28 – 2024-01-02 (×5): 650 mg via ORAL
  Filled 2023-12-28 (×5): qty 2

## 2023-12-28 MED ORDER — PREDNISONE 20 MG PO TABS
20.0000 mg | ORAL_TABLET | Freq: Two times a day (BID) | ORAL | Status: DC
  Administered 2023-12-28 – 2024-01-04 (×15): 20 mg via ORAL
  Filled 2023-12-28 (×15): qty 1

## 2023-12-28 MED ORDER — ATORVASTATIN CALCIUM 20 MG PO TABS
20.0000 mg | ORAL_TABLET | Freq: Every day | ORAL | Status: DC
  Administered 2023-12-28 – 2024-01-04 (×8): 20 mg via ORAL
  Filled 2023-12-28 (×8): qty 1

## 2023-12-28 MED ORDER — ENOXAPARIN SODIUM 30 MG/0.3ML IJ SOSY
30.0000 mg | PREFILLED_SYRINGE | INTRAMUSCULAR | Status: DC
Start: 2023-12-28 — End: 2023-12-28

## 2023-12-28 NOTE — Subjective & Objective (Signed)
 Mrs. Amanda [name], an 83 y/o with h/o HTN, HLD, anemia, CKD 3a, dementia, chronic back pain, remote h/o asthma, h/o bronchitis in the past developed acute mental status changes at approximately 2PM 12/27/23 - less responsive than usual, not taking food or fluids. History is obtained from husband as patient unable to give a history.  She had not had any fever, question of having a cough, no N/V/D. Due to her change in mental status EMS was activated. EMTs on arrival found that patient to be hypoxemic into 80% range on room air and hypotensive with BP of 70/30. IN the field she was put to supplemental oxygen and administered IV steroids in transit to [facility].

## 2023-12-28 NOTE — ED Notes (Signed)
 This tech set up dinner tray for pt. Pt now eating dinner at this time. Sitter remains at bedside.

## 2023-12-28 NOTE — Assessment & Plan Note (Signed)
 Patient with hypoxemia with O2 sat in the low 80/s on room air. CXR w/o infiltrates, effusion, edema. ON exam coarse rhonchi and expiratory wheezing with prolonged expiratory phase. No evidence of bacterial infection with no fever, no leukocytosis. Viral respiratory panel positive for RSV.  Plan Med-surg admit  Supplemental oxygen to keep O2 sat >90%  Albuterol nebulizer treatments q6 hrs  Prednisone 20 mg BID  with taper as her condition improves  Incentive spsirometry q 4 hrs while awake

## 2023-12-28 NOTE — ED Notes (Signed)
 Admitting provider at bedside.

## 2023-12-28 NOTE — ED Notes (Signed)
 This tech provided pt with a beverage and a snack, pt has no other needs at the time. This tech is at bedside.

## 2023-12-28 NOTE — H&P (Signed)
 History and Physical    Amanda [name] ZOX:[id number] DOB: [birth year] DOA: 12/27/2023  DOS: the patient was seen and examined on 12/27/2023  PCP: Hamrick, Durward Fortes, MD   Patient coming from: Home  I have personally briefly reviewed patient's old medical records in [facility] [place] [facility]  Mrs. Gerlich, an 83 y/o with h/o HTN, HLD, anemia, CKD 3a, dementia, chronic back pain, remote h/o asthma, h/o bronchitis in the past developed acute mental status changes at approximately 2PM 12/27/23 - less responsive than usual, not taking food or fluids. History is obtained from husband as patient unable to give a history.  She had not had any fever, question of having a cough, no N/V/D. Due to her change in mental status EMS was activated. EMTs on arrival found that patient to be hypoxemic into 80% range on room air and hypotensive with BP of 70/30. IN the field she was put to supplemental oxygen and administered IV steroids in transit to [facility].   ED Course: T 98.3  96/57  HR 62  RR 14. Pleasant overweight woman on stretcher awake and conversant who knows where she is but not why she is here. Voices no complaints. Lab: VBG 7.33/44/55; Na 130, BUN 51, Cr 1.9 (baseline1.4), albumin 3.2, WBC 8.6 72/17/9, Hgb 11.5.  Respiratory  viral panel positive for RSV. U/A negative. CXR - NAD, no infiltrate, no pulmonary edema. In ED she received 2l LR. She had been given IV solumedrol in the field. She was put to supplemental oxygen at 4L with a good response. It was noted that when she takes oxygen off O2 sats drop to low 80's. TRH called to admit for continued management of acute respiratory failure with hypoxemia due to RSV infection.   Review of Systems: caveat patient with dementia - poor historian. Review of Systems  Constitutional:  Negative for chills and fever.  HENT: Negative.    Eyes: Negative.   Respiratory:  Positive for cough, shortness of breath and wheezing.   Cardiovascular: Negative.   Gastrointestinal:  Negative.   Genitourinary: Negative.   Musculoskeletal: Negative.   Skin: Negative.   Neurological: Negative.   Endo/Heme/Allergies: Negative.   Psychiatric/Behavioral:  Positive for memory loss. The patient is nervous/anxious.     Past Medical History:  Diagnosis Date   Anxiety    Asthma    Complication of anesthesia    difficulty waking up from anesthesia   GI bleed    Hiatal hernia    Hypertension    IDA (iron deficiency anemia)     Past Surgical History:  Procedure Laterality Date   ABDOMINAL HYSTERECTOMY     BACK SURGERY      lumbar fusion   BILATERAL CARPAL TUNNEL RELEASE     BREAST BIOPSY Left 01/17/2002   collapsed  lung Left    COLONOSCOPY  04/28/2012   COLONOSCOPY  02-12-15   Dr Lemar Livings   CYSTOSCOPY/URETEROSCOPY/HOLMIUM LASER/STENT PLACEMENT Right 09/04/2023   Procedure: CYSTOSCOPY/URETEROSCOPY/HOLMIUM LASER/STENT PLACEMENT;  Surgeon: Sondra Come, MD;  Location: [facility] ORS;  Service: Urology;  Laterality: Right;   ESOPHAGEAL MANOMETRY N/A 06/13/2015   Procedure: ESOPHAGEAL MANOMETRY (EM);  Surgeon: Elnita Maxwell, MD;  Location: [facility] ENDOSCOPY;  Service: Endoscopy;  Laterality: N/A;   ESOPHAGUS SURGERY  Oct 2016   Dr Alva Garnet in [place]   KNEE ARTHROSCOPY Left    PARAESOPHAGEAL HERNIA REPAIR  07-26-15   Dr Alva Garnet   PARATHYROIDECTOMY Left    one gland on left removed   TOTAL HIP ARTHROPLASTY Left  05/12/2017   Procedure: TOTAL HIP ARTHROPLASTY ANTERIOR APPROACH;  Surgeon: Kennedy Bucker, MD;  Location: [facility] ORS;  Service: Orthopedics;  Laterality: Left;   TOTAL HIP ARTHROPLASTY Right 03/04/2018   Procedure: TOTAL HIP ARTHROPLASTY ANTERIOR APPROACH;  Surgeon: Kennedy Bucker, MD;  Location: [facility] ORS;  Service: Orthopedics;  Laterality: Right;   UPPER GI ENDOSCOPY  04/28/2012    Soc Hx - 1st marriage approx 20 years, Current marriage in 44th year. She had a daughter killed in an MVA, two sons, several grandchildren. Lives with her husband.    reports that she has quit  smoking. Her smoking use included cigarettes. She has a 30 pack-year smoking history. She has never used smokeless tobacco. She reports that she does not drink alcohol and does not use drugs.  Allergies  Allergen Reactions   Other Other (See Comments)    general anesthesia=difficulty waking   Codeine Nausea And Vomiting   Penicillins Hives    Has patient had a PCN reaction causing immediate rash, facial/tongue/throat swelling, SOB or lightheadedness with hypotension: Yes Has patient had a PCN reaction causing severe rash involving mucus membranes or skin necrosis: Unknown Has patient had a PCN reaction that required hospitalization: No Has patient had a PCN reaction occurring within the last 10 years: No If all of the above answers are "NO", then may proceed with Cephalosporin use.     History reviewed. No pertinent family history.  Prior to Admission medications   Medication Sig Start Date End Date Taking? Authorizing Provider  albuterol (VENTOLIN HFA) 108 (90 Base) MCG/ACT inhaler Inhale into the lungs every 6 (six) hours as needed for wheezing or shortness of breath.    [provider]  aspirin EC 81 MG tablet Take 81 mg by mouth daily. 04/01/23 03/31/24  [provider]  atorvastatin (LIPITOR) 20 MG tablet Take 20 mg by mouth daily. 08/27/23 08/26/24  [provider]  carvedilol (COREG) 6.25 MG tablet Take 6.25 mg by mouth 2 (two) times daily with a meal. 08/27/23   [provider]  citalopram (CELEXA) 10 MG tablet Take 1 tablet by mouth daily. 08/12/23   [provider]  cyanocobalamin (VITAMIN B12) 1000 MCG tablet Take 1,000 mcg by mouth daily.    [provider]  dicyclomine (BENTYL) 10 MG capsule Take 10 mg by mouth 2 (two) times daily as needed.    [provider]  donepezil (ARICEPT) 5 MG tablet Take 5 mg by mouth daily. 08/31/23   [provider]  DULoxetine (CYMBALTA) 30 MG capsule Take 30 mg by mouth 2 (two)  times daily.    [provider]  memantine (NAMENDA) 10 MG tablet Take 1 tablet by mouth 2 (two) times daily. 08/27/23   [provider]    Physical Exam: Vitals:   12/27/23 2230 12/27/23 2300 12/28/23 0100 12/28/23 0107  BP: (!) 102/55 (!) 96/57 115/88   Pulse: 63 62 (!) 59   Resp: 14  18   Temp:    98.5 F (36.9 C)  TempSrc:    Oral  SpO2: 93% 91% 98%     Physical Exam Vitals and nursing note reviewed.  Constitutional:      General: She is not in acute distress.    Appearance: She is obese. She is not ill-appearing.     Comments: Awake, conversant, oriented to place, self not to context  HENT:     Head: Normocephalic and atraumatic.     Mouth/Throat:     Mouth: Mucous  membranes are moist.     Pharynx: No oropharyngeal exudate.  Eyes:     Extraocular Movements: Extraocular movements intact.     Conjunctiva/sclera: Conjunctivae normal.     Pupils: Pupils are equal, round, and reactive to light.  Cardiovascular:     Rate and Rhythm: Normal rate and regular rhythm.     Pulses: Normal pulses.     Heart sounds: Normal heart sounds.  Pulmonary:     Effort: Pulmonary effort is normal.     Breath sounds: Wheezing and rhonchi present.     Comments: No increased WOB on supplemental oxygen. Prolonged expiratory phase. Coarse soft rhonchi mid-way up back bilaterally, expiratory wheezing.  Abdominal:     General: Bowel sounds are normal.     Palpations: Abdomen is soft.     Tenderness: There is no abdominal tenderness.  Musculoskeletal:        General: Normal range of motion.     Cervical back: Neck supple.     Right lower leg: No edema.     Left lower leg: No edema.  Lymphadenopathy:     Cervical: No cervical adenopathy.  Skin:    General: Skin is warm and dry.     Findings: No lesion.  Neurological:     Mental Status: Mental status is at baseline.     Cranial Nerves: No cranial nerve deficit.     Comments: Oriented to person, place, not oriented to  context, date.  Keeps removing oxgyen nasal cannula  Psychiatric:        Mood and Affect: Mood normal.        Behavior: Behavior normal.      Labs on Admission: I have personally reviewed following labs and imaging studies  CBC: Recent Labs  Lab 12/27/23 2010  WBC 8.6  NEUTROABS 6.3  HGB 11.5*  HCT 34.7*  MCV 88.7  PLT 208   Basic Metabolic Panel: Recent Labs  Lab 12/27/23 2010  NA 130*  K 4.8  CL 96*  CO2 22  GLUCOSE 107*  BUN 51*  CREATININE 1.92*  CALCIUM 8.5*   GFR: CrCl cannot be calculated (Unknown ideal weight.). Liver Function Tests: Recent Labs  Lab 12/27/23 2010  AST 18  ALT 11  ALKPHOS 62  BILITOT 1.3*  PROT 6.7  ALBUMIN 3.2*   No results for input(s): "LIPASE", "AMYLASE" in the last 168 hours. No results for input(s): "AMMONIA" in the last 168 hours. Coagulation Profile: No results for input(s): "INR", "PROTIME" in the last 168 hours. Cardiac Enzymes: No results for input(s): "CKTOTAL", "CKMB", "CKMBINDEX", "TROPONINI" in the last 168 hours. BNP (last 3 results) No results for input(s): "PROBNP" in the last 8760 hours. HbA1C: No results for input(s): "HGBA1C" in the last 72 hours. CBG: No results for input(s): "GLUCAP" in the last 168 hours. Lipid Profile: No results for input(s): "CHOL", "HDL", "LDLCALC", "TRIG", "CHOLHDL", "LDLDIRECT" in the last 72 hours. Thyroid Function Tests: No results for input(s): "TSH", "T4TOTAL", "FREET4", "T3FREE", "THYROIDAB" in the last 72 hours. Anemia Panel: No results for input(s): "VITAMINB12", "FOLATE", "FERRITIN", "TIBC", "IRON", "RETICCTPCT" in the last 72 hours. Urine analysis:    Component Value Date/Time   COLORURINE AMBER (A) 12/27/2023 2303   APPEARANCEUR CLEAR (A) 12/27/2023 2303   APPEARANCEUR Clear 02/09/2015 1254   LABSPEC 1.015 12/27/2023 2303   LABSPEC 1.011 02/09/2015 1254   PHURINE 5.0 12/27/2023 2303   GLUCOSEU NEGATIVE 12/27/2023 2303   GLUCOSEU Negative 02/09/2015 1254    HGBUR NEGATIVE 12/27/2023 2303  BILIRUBINUR NEGATIVE 12/27/2023 2303   BILIRUBINUR Negative 02/09/2015 1254   KETONESUR NEGATIVE 12/27/2023 2303   PROTEINUR 30 (A) 12/27/2023 2303   NITRITE NEGATIVE 12/27/2023 2303   LEUKOCYTESUR NEGATIVE 12/27/2023 2303   LEUKOCYTESUR Trace 02/09/2015 1254    Radiological Exams on Admission: I have personally reviewed images DG Chest Port 1 View Result Date: 12/27/2023 CLINICAL DATA:  Questionable sepsis - evaluate for abnormality Decreased oxygen saturation.  Hypotension.  Wheezing. EXAM: PORTABLE CHEST 1 VIEW COMPARISON:  02/03/2023 FINDINGS: Stable cardiomegaly. Aortic atherosclerosis. There is a hiatal hernia. No focal airspace disease, large pleural effusion or pneumothorax. On limited assessment, no acute osseous findings. IMPRESSION: 1. No acute chest findings. 2. Stable cardiomegaly. 3. Hiatal hernia. Electronically Signed   By: Narda Rutherford M.D.   On: 12/27/2023 20:33    EKG: I have personally reviewed EKG: sinus rhythm, old posterior injury, no acute changes  Assessment/Plan Principal Problem:   Acute respiratory failure with hypoxemia (HCC) Active Problems:   Acute metabolic encephalopathy   Acute respiratory failure with hypoxia (HCC)   AKI (acute kidney injury) (HCC)   Dementia (HCC)   Essential hypertension    Assessment and Plan: Acute respiratory failure with hypoxia (HCC) Patient with hypoxemia with O2 sat in the low 80/s on room air. CXR w/o infiltrates, effusion, edema. ON exam coarse rhonchi and expiratory wheezing with prolonged expiratory phase. No evidence of bacterial infection with no fever, no leukocytosis. Viral respiratory panel positive for RSV.  Plan Med-surg admit  Supplemental oxygen to keep O2 sat >90%  Albuterol nebulizer treatments q6 hrs  Prednisone 20 mg BID  with taper as her condition improves  Incentive spsirometry q 4 hrs while awake  Acute metabolic encephalopathy Patient with baseline dementia  with acute decline in mental status on the day of admission secondary to hypoxemia and hypotension. Patient's mental status improved significantly after fluid resuscitation and initiation of supplemental oxygen in the ED  Plan Address underlying medical issues  Supportive care.  Dementia [facility]) Patient with baseline dementia but able to manage ADLs, conversant. She does have incontinence.  Plan Continue home regimen  AKI (acute kidney injury) (HCC) Patient with CKD 3a with baseline Cr approximately 1.4. ON admission Cr 1.9. Per husband patient with virtually no po intake x 24 hrs. In ED she received 2L LR to treat hypotension.  Plan IVF - 1/2 NS 75 cc/hr 24 hrs  F/u Bmet 12/29/23  Essential hypertension BP by EMT 70/30. After fluid resuscitation BP 98/57.  Plan Resume home BP medication but hold for SBP < 120       DVT prophylaxis: Lovenox Code Status: Full Code discusse with husband. They have approached subject but no decisions. At this time requests full code.  Family Communication: spoke with Mr. Keeran  Disposition Plan: home when stable  Consults called: none  Admission status: Inpatient, Med-Surg   Illene Regulus, MD [facility] 12/28/2023, 1:19 AM

## 2023-12-28 NOTE — Assessment & Plan Note (Signed)
 BP by EMT 70/30. After fluid resuscitation BP 98/57.  Plan Resume home BP medication but hold for SBP < 120

## 2023-12-28 NOTE — Assessment & Plan Note (Signed)
 Patient with baseline dementia but able to manage ADLs, conversant. She does have incontinence.  Plan Continue home regimen

## 2023-12-28 NOTE — ED Notes (Signed)
 This tech and Vance Gather NT checked pt to make sure she was clean and dry. Pt is clean at this time and resting comfortably in bed. New sitter at bedside.

## 2023-12-28 NOTE — Assessment & Plan Note (Signed)
 Patient with baseline dementia with acute decline in mental status on the day of admission secondary to hypoxemia and hypotension. Patient's mental status improved significantly after fluid resuscitation and initiation of supplemental oxygen in the ED  Plan Address underlying medical issues  Supportive care.

## 2023-12-28 NOTE — Plan of Care (Signed)
 Patient seen and examined this morning.  No overnight events.  Feeling better.  Continues to have rhonchi and expiratory wheeze with prolonged expiratory phase.  Added Pulmicort to the regimen.  -Rest of the plan as per H&P.

## 2023-12-28 NOTE — Assessment & Plan Note (Signed)
 Patient with CKD 3a with baseline Cr approximately 1.4. ON admission Cr 1.9. Per husband patient with virtually no po intake x 24 hrs. In ED she received 2L LR to treat hypotension.  Plan IVF - 1/2 NS 75 cc/hr 24 hrs  F/u Bmet 12/29/23

## 2023-12-28 NOTE — ED Notes (Signed)
 Called to pt's room by family because pt removed her IV from left forearm. Site cleaned and dressed. Bleeding controlled. Pt continues to be restless and removes nasal cannula and pulse ox. Pt redirected and reminded of importance of equipment and need to leave in place.

## 2023-12-28 NOTE — ED Notes (Signed)
 Pt's breathing treatment is complete, this tech turned it off and hooked pt back up to nasal cannula on 2 liters.

## 2023-12-29 DIAGNOSIS — J205 Acute bronchitis due to respiratory syncytial virus: Secondary | ICD-10-CM

## 2023-12-29 DIAGNOSIS — G9341 Metabolic encephalopathy: Secondary | ICD-10-CM | POA: Diagnosis not present

## 2023-12-29 DIAGNOSIS — N1831 Chronic kidney disease, stage 3a: Secondary | ICD-10-CM | POA: Insufficient documentation

## 2023-12-29 DIAGNOSIS — J9601 Acute respiratory failure with hypoxia: Secondary | ICD-10-CM | POA: Diagnosis not present

## 2023-12-29 LAB — CBC
HCT: 32.5 % — ABNORMAL LOW (ref 36.0–46.0)
Hemoglobin: 11.1 g/dL — ABNORMAL LOW (ref 12.0–15.0)
MCH: 29.7 pg (ref 26.0–34.0)
MCHC: 34.2 g/dL (ref 30.0–36.0)
MCV: 86.9 fL (ref 80.0–100.0)
Platelets: 194 10*3/uL (ref 150–400)
RBC: 3.74 MIL/uL — ABNORMAL LOW (ref 3.87–5.11)
RDW: 13.5 % (ref 11.5–15.5)
WBC: 14.3 10*3/uL — ABNORMAL HIGH (ref 4.0–10.5)
nRBC: 0 % (ref 0.0–0.2)

## 2023-12-29 LAB — COMPREHENSIVE METABOLIC PANEL
ALT: 19 U/L (ref 0–44)
AST: 28 U/L (ref 15–41)
Albumin: 2.7 g/dL — ABNORMAL LOW (ref 3.5–5.0)
Alkaline Phosphatase: 71 U/L (ref 38–126)
Anion gap: 10 (ref 5–15)
BUN: 55 mg/dL — ABNORMAL HIGH (ref 8–23)
CO2: 20 mmol/L — ABNORMAL LOW (ref 22–32)
Calcium: 8 mg/dL — ABNORMAL LOW (ref 8.9–10.3)
Chloride: 100 mmol/L (ref 98–111)
Creatinine, Ser: 1.43 mg/dL — ABNORMAL HIGH (ref 0.44–1.00)
GFR, Estimated: 36 mL/min — ABNORMAL LOW (ref >60)
Glucose, Bld: 145 mg/dL — ABNORMAL HIGH (ref 70–99)
Potassium: 4.1 mmol/L (ref 3.5–5.1)
Sodium: 130 mmol/L — ABNORMAL LOW (ref 135–145)
Total Bilirubin: 0.4 mg/dL (ref 0.0–1.2)
Total Protein: 6.2 g/dL — ABNORMAL LOW (ref 6.5–8.1)

## 2023-12-29 LAB — MAGNESIUM: Magnesium: 2.3 mg/dL (ref 1.7–2.4)

## 2023-12-29 MED ORDER — IPRATROPIUM-ALBUTEROL 0.5-2.5 (3) MG/3ML IN SOLN: 3.0000 mL | Freq: Four times a day (QID) | RESPIRATORY_TRACT | Status: DC

## 2023-12-29 MED ORDER — MELATONIN 5 MG PO TABS
10.0000 mg | ORAL_TABLET | Freq: Once | ORAL | Status: AC
  Administered 2023-12-29: 10 mg via ORAL
  Filled 2023-12-29: qty 2

## 2023-12-29 MED ORDER — IPRATROPIUM-ALBUTEROL 0.5-2.5 (3) MG/3ML IN SOLN
3.0000 mL | Freq: Two times a day (BID) | RESPIRATORY_TRACT | Status: DC
  Administered 2023-12-29 – 2023-12-31 (×4): 3 mL via RESPIRATORY_TRACT
  Filled 2023-12-29 (×4): qty 3

## 2023-12-29 MED ORDER — SODIUM BICARBONATE 650 MG PO TABS
1300.0000 mg | ORAL_TABLET | Freq: Two times a day (BID) | ORAL | Status: DC
  Administered 2023-12-29 – 2023-12-30 (×2): 1300 mg via ORAL
  Filled 2023-12-29 (×2): qty 2

## 2023-12-29 MED ORDER — ALBUTEROL SULFATE (2.5 MG/3ML) 0.083% IN NEBU: 2.5000 mg | INHALATION_SOLUTION | Freq: Two times a day (BID) | RESPIRATORY_TRACT | Status: DC

## 2023-12-29 NOTE — Evaluation (Signed)
 Physical Therapy Evaluation Patient Details Name: Amanda [name] MRN: [id number] DOB: 03-05-1941 Today's Date: 12/29/2023  History of Present Illness  Pt admitted for Acute Resp failure with complaints of unresponsiveness and AMS. History includes HTN, HLD, anemi, CKD stage 3, dementia, and broncitis.  Clinical Impression  Pt is a pleasant 83 year old female who was admitted for ARF with confusion. Pt performs bed mobility with supervision, transfers with cga, and ambulation with cga and RW. Pt demonstrates deficits with strength/mobility/endurance. Would benefit from skilled PT to address above deficits and promote optimal return to PLOF. Pt will continue to receive skilled PT services while admitted and will defer to TOC/care team for updates regarding disposition planning.         If plan is discharge home, recommend the following: A little help with walking and/or transfers;A little help with bathing/dressing/bathroom;Supervision due to cognitive status   Can travel by private vehicle        Equipment Recommendations Rolling walker (2 wheels)  Recommendations for Other Services       Functional Status Assessment Patient has had a recent decline in their functional status and demonstrates the ability to make significant improvements in function in a reasonable and predictable amount of time.     Precautions / Restrictions Precautions Precautions: Fall Recall of Precautions/Restrictions: Intact Restrictions Weight Bearing Restrictions Per Provider Order: No      Mobility  Bed Mobility Overal bed mobility: Needs Assistance Bed Mobility: Supine to Sit     Supine to sit: Supervision     General bed mobility comments: safe technique with ease of transfer    Transfers Overall transfer level: Needs assistance Equipment used: Rolling walker (2 wheels) Transfers: Sit to/from Stand Sit to Stand: Contact guard assist           General transfer comment: follows commands  well. RW used    Ambulation/Gait Ambulation/Gait assistance: Contact guard assist Gait Distance (Feet): 200 Feet Assistive device: Rolling walker (2 wheels) Gait Pattern/deviations: Step-through pattern       General Gait Details: ambulated with safe technique. All mobility performed on 2L with O2 sats WNL. No SOB symptoms.  Stairs            Wheelchair Mobility     Tilt Bed    Modified Rankin (Stroke Patients Only)       Balance Overall balance assessment: Needs assistance Sitting-balance support: Feet supported Sitting balance-Leahy Scale: Good     Standing balance support: Bilateral upper extremity supported Standing balance-Leahy Scale: Good                               Pertinent Vitals/Pain Pain Assessment Pain Assessment: Faces Faces Pain Scale: Hurts little more Pain Location: B LEs Pain Descriptors / Indicators: Aching Pain Intervention(s): Limited activity within patient's tolerance    Home Living Family/patient expects to be discharged to:: Private residence Living Arrangements: Spouse/significant other Available Help at Discharge: Family;Available PRN/intermittently Type of Home: House Home Access: Stairs to enter Entrance Stairs-Rails: Can reach both Entrance Stairs-Number of Steps: 5   Home Layout: One level Home Equipment: Cane - single point;BSC/3in1;Wheelchair - manual Additional Comments: spouse is currently in SNF at this time    Prior Function Prior Level of Function : Independent/Modified Independent;Driving             Mobility Comments: uses SPC at baseline, reports several falls ADLs Comments: mod I     Extremity/Trunk Assessment  Upper Extremity Assessment Upper Extremity Assessment: Overall WFL for tasks assessed    Lower Extremity Assessment Lower Extremity Assessment: Generalized weakness (B LE grossly 4/5)       Communication   Communication Communication: No apparent difficulties     Cognition Arousal: Alert Behavior During Therapy: WFL for tasks assessed/performed   PT - Cognitive impairments: Memory, Awareness                       PT - Cognition Comments: pt alert and oriented to person/place- however poor safety awareness and memory of events leading to hospitalization Following commands: Intact       Cueing Cueing Techniques: Verbal cues     General Comments      Exercises Other Exercises Other Exercises: pt on BSC upon arrival. Pt able to perform hygiene safely.   Assessment/Plan    PT Assessment Patient needs continued PT services  PT Problem List Decreased strength;Decreased mobility;Decreased knowledge of use of DME;Decreased safety awareness       PT Treatment Interventions DME instruction;Gait training    PT Goals (Current goals can be found in the Care Plan section)  Acute Rehab PT Goals Patient Stated Goal: to go home PT Goal Formulation: With patient Time For Goal Achievement: 01/12/24 Potential to Achieve Goals: Good    Frequency Min 1X/week     Co-evaluation               AM-PAC PT "6 Clicks" Mobility  Outcome Measure Help needed turning from your back to your side while in a flat bed without using bedrails?: None Help needed moving from lying on your back to sitting on the side of a flat bed without using bedrails?: None Help needed moving to and from a bed to a chair (including a wheelchair)?: A Little Help needed standing up from a chair using your arms (e.g., wheelchair or bedside chair)?: A Little Help needed to walk in hospital room?: A Little Help needed climbing 3-5 steps with a railing? : A Little 6 Click Score: 20    End of Session Equipment Utilized During Treatment: Oxygen Activity Tolerance: Patient tolerated treatment well Patient left: in bed;with bed alarm set Nurse Communication: Mobility status PT Visit Diagnosis: Muscle weakness (generalized) (M62.81);Difficulty in walking, not elsewhere  classified (R26.2)    Time: 1610-9604 PT Time Calculation (min) (ACUTE ONLY): 28 min   Charges:   PT Evaluation $PT Eval Low Complexity: 1 Low PT Treatments $Gait Training: 8-22 mins PT General Charges $$ ACUTE PT VISIT: 1 Visit         Elizabeth Palau, PT, DPT, GCS [phone]   Amanda [name] 12/29/2023, 4:41 PM

## 2023-12-29 NOTE — Progress Notes (Addendum)
  Progress Note   [name] WNU:[id number] DOB: 05/05/1941 DOA: 12/27/2023     1 DOS: the patient was seen and examined on 12/29/2023   Brief hospital course:  Mrs. Swango, an 83 y/o with h/o HTN, HLD, anemia, CKD 3a, dementia, chronic back pain, remote h/o asthma, h/o bronchitis in the past developed acute mental status changes at approximately [facility] 12/27/23  Upon arriving hospital, patient had significant hypoxemia with oxygen saturation 80%, with blood pressure 70/30. She was found to have RSV positive, she was placed on high flow oxygen.  She also had a significant bronchospasm, was placed on IV steroids.   Principal Problem:   Acute respiratory failure with hypoxemia (HCC) Active Problems:   Acute metabolic encephalopathy   Acute respiratory failure with hypoxia (HCC)   AKI (acute kidney injury) (HCC)   Dementia (HCC)   Essential hypertension   Acute bronchitis due to respiratory syncytial virus (RSV)   CKD stage 3a, GFR 45-59 ml/min (HCC)   Assessment and Plan: Acute respiratory failure with hypoxia (HCC) Acute bronchitis secondary to RSV infection. Acute metabolic cephalopathy secondary to RSV infection. Patient with hypoxemia with O2 sat in the low 80/s on room air.  Patient had some shortness of breath at that time.  CXR w/o infiltrates, effusion, edema. ON exam coarse rhonchi and expiratory wheezing with prolonged expiratory phase. No evidence of bacterial infection with no fever, no leukocytosis. Viral respiratory panel positive for RSV. Continue steroids and scheduled DuoNeb.  Wean oxygen. Patient is still confused, but no agitation.  Dementia (HCC) Continue home medicines.  Chronic kidney disease stage IIIa. Hyponatremia. Acute kidney injury ruled out. Metabolic acidosis. Patient does not meet criteria for acute kidney injury.  Renal function slightly worse, but improving.  Start sodium bicarb.  Discontinue IV fluids.  Essential hypertension Transient  hypotension. Patient had significant hypotension at time of admission, resolved after fluids.  Hold off all blood pressure medicines.        Subjective:  Patient has some baseline confusion, otherwise doing well.  Denies any short of breath today.  Physical Exam: Vitals:   12/28/23 2347 12/29/23 0441 12/29/23 0728 12/29/23 1533  BP:  (!) 122/58  122/63  Pulse:  60  64  Resp:  18  18  Temp:  98.1 F (36.7 C)  97.8 F (36.6 C)  TempSrc:    Oral  SpO2: 100% 100% 95% 96%  Weight:      Height:       General exam: Appears calm and comfortable  Respiratory system: Some wheezes. Respiratory effort normal. Cardiovascular system: S1 & S2 heard, RRR. No JVD, murmurs, rubs, gallops or clicks. No pedal edema. Gastrointestinal system: Abdomen is nondistended, soft and nontender. No organomegaly or masses felt. Normal bowel sounds heard. Central nervous system: Alert and oriented x2. No focal neurological deficits. Extremities: Symmetric 5 x 5 power. Skin: No rashes, lesions or ulcers Psychiatry:  Mood & affect appropriate.    Data Reviewed:  Chest x-ray and lab results reviewed.  Family Communication: Husband updated over the phone.  Disposition: Status is: Inpatient Remains inpatient appropriate because: Severity of disease, IV treatment.     Time spent: 50 minutes  Author: Marrion Coy, MD 12/29/2023 3:37 PM  For on call review [email or web address].

## 2023-12-29 NOTE — Plan of Care (Signed)

## 2023-12-29 NOTE — Hospital Course (Addendum)
  Amanda [name], an 83 y/o with h/o HTN, HLD, anemia, CKD 3a, dementia, chronic back pain, remote h/o asthma, h/o bronchitis in the past developed acute mental status changes at approximately [facility] 12/27/23  Upon arriving hospital, patient had significant hypoxemia with oxygen saturation 80%, with blood pressure 70/30. She was found to have RSV positive, she was placed on high flow oxygen.  She also had a significant bronchospasm, was placed on IV steroids.  Patient condition had improved, medically stable for discharge.  Obtain home oxygen evaluation.

## 2023-12-30 DIAGNOSIS — E872 Acidosis, unspecified: Secondary | ICD-10-CM | POA: Insufficient documentation

## 2023-12-30 DIAGNOSIS — J9601 Acute respiratory failure with hypoxia: Secondary | ICD-10-CM | POA: Diagnosis not present

## 2023-12-30 DIAGNOSIS — E66811 Obesity, class 1: Secondary | ICD-10-CM | POA: Insufficient documentation

## 2023-12-30 DIAGNOSIS — E871 Hypo-osmolality and hyponatremia: Secondary | ICD-10-CM | POA: Insufficient documentation

## 2023-12-30 DIAGNOSIS — G9341 Metabolic encephalopathy: Secondary | ICD-10-CM | POA: Diagnosis not present

## 2023-12-30 DIAGNOSIS — J205 Acute bronchitis due to respiratory syncytial virus: Secondary | ICD-10-CM | POA: Diagnosis not present

## 2023-12-30 LAB — BASIC METABOLIC PANEL
Anion gap: 9 (ref 5–15)
BUN: 44 mg/dL — ABNORMAL HIGH (ref 8–23)
CO2: 22 mmol/L (ref 22–32)
Calcium: 8.1 mg/dL — ABNORMAL LOW (ref 8.9–10.3)
Chloride: 101 mmol/L (ref 98–111)
Creatinine, Ser: 1.2 mg/dL — ABNORMAL HIGH (ref 0.44–1.00)
GFR, Estimated: 45 mL/min — ABNORMAL LOW (ref >60)
Glucose, Bld: 123 mg/dL — ABNORMAL HIGH (ref 70–99)
Potassium: 3.9 mmol/L (ref 3.5–5.1)
Sodium: 132 mmol/L — ABNORMAL LOW (ref 135–145)

## 2023-12-30 LAB — PHOSPHORUS: Phosphorus: 1.6 mg/dL — ABNORMAL LOW (ref 2.5–4.6)

## 2023-12-30 LAB — MAGNESIUM: Magnesium: 2.2 mg/dL (ref 1.7–2.4)

## 2023-12-30 MED ORDER — MAGNESIUM SULFATE 2 GM/50ML IV SOLN
2.0000 g | Freq: Once | INTRAVENOUS | Status: AC
  Administered 2023-12-30: 2 g via INTRAVENOUS
  Filled 2023-12-30: qty 50

## 2023-12-30 MED ORDER — HALOPERIDOL LACTATE 5 MG/ML IJ SOLN
2.0000 mg | Freq: Once | INTRAMUSCULAR | Status: AC
  Administered 2023-12-30: 2 mg via INTRAMUSCULAR
  Filled 2023-12-30: qty 1

## 2023-12-30 MED ORDER — POTASSIUM & SODIUM PHOSPHATES 280-160-250 MG PO PACK: 2.0000 | PACK | Freq: Three times a day (TID) | ORAL | Status: DC

## 2023-12-30 MED ORDER — SODIUM CHLORIDE 0.9 % IV SOLN: 30.0000 mmol | Freq: Once | INTRAVENOUS | Status: DC

## 2023-12-30 MED ORDER — ENOXAPARIN SODIUM 40 MG/0.4ML IJ SOSY
40.0000 mg | PREFILLED_SYRINGE | INTRAMUSCULAR | Status: DC
  Administered 2023-12-31 – 2024-01-04 (×5): 40 mg via SUBCUTANEOUS
  Filled 2023-12-30 (×5): qty 0.4

## 2023-12-30 MED ORDER — QUETIAPINE FUMARATE 25 MG PO TABS
25.0000 mg | ORAL_TABLET | Freq: Every day | ORAL | Status: DC
  Administered 2023-12-30 – 2024-01-03 (×4): 25 mg via ORAL
  Filled 2023-12-30 (×4): qty 1

## 2023-12-30 MED ORDER — SODIUM PHOSPHATES 45 MMOLE/15ML IV SOLN
30.0000 mmol | Freq: Once | INTRAVENOUS | Status: AC
  Administered 2023-12-30: 30 mmol via INTRAVENOUS
  Filled 2023-12-30: qty 10

## 2023-12-30 NOTE — Plan of Care (Signed)
  Problem: Safety: Goal: Ability to remain free from injury will improve Outcome: Progressing   Problem: Elimination: Goal: Will not experience complications related to bowel motility Outcome: Progressing Goal: Will not experience complications related to urinary retention Outcome: Progressing

## 2023-12-30 NOTE — Progress Notes (Signed)
 Progress Note   [name] WUJ:[id number] DOB: August 10, 1941 DOA: 12/27/2023     2 DOS: the patient was seen and examined on 12/30/2023   Brief hospital course:  Amanda [name], an 83 y/o with h/o HTN, HLD, anemia, CKD 3a, dementia, chronic back pain, remote h/o asthma, h/o bronchitis in the past developed acute mental status changes at approximately [facility] 12/27/23  Upon arriving hospital, patient had significant hypoxemia with oxygen saturation 80%, with blood pressure 70/30. She was found to have RSV positive, she was placed on high flow oxygen.  She also had a significant bronchospasm, was placed on IV steroids.  Condition improving, off oxygen, blood pressure better.  Medically stable for discharge.  Will continue as needed and scheduled bronchodilator and steroids.   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 Acute metabolic cephalopathy secondary to RSV infection. Patient with hypoxemia with O2 sat in the low 80/s on room air.  Patient had some shortness of breath at that time.  CXR w/o infiltrates, effusion, edema. ON exam coarse rhonchi and expiratory wheezing with prolonged expiratory phase. No evidence of bacterial infection with no fever, no leukocytosis. Viral respiratory panel positive for RSV. Patient condition improving, off oxygen, however, patient still has significant amount of bronchospasm.  Will continue steroids and DuoNeb. Patient currently off oxygen. Mental status has improved, no confusion  today.   Dementia (HCC) Continue home medicines.   Chronic kidney disease stage IIIa. Hyponatremia. Acute kidney injury ruled out. Metabolic acidosis. Hypophosphatemia. Patient does not meet criteria for acute kidney injury.  Renal function slightly worse, but improving.  Metabolic cytosis resolved.  Patient developed hypophosphatemia, will give an IV phosphate.   Essential hypertension Transient hypotension. Patient had significant hypotension at time of admission, resolved after fluids.  Hold off all blood pressure medicines.      Subjective:  Patient off oxygen, short of breath much improved.  However still has some wheezing.  Physical Exam: Vitals:   12/29/23 2110 12/30/23 0315 12/30/23 0800 12/30/23 0809  BP: 114/71 (!) 158/82  (!) 150/88  Pulse: 71 68  64  Resp: 18 16  14   Temp: 97.7 F (36.5 C) 98 F (36.7 C)  97.9 F (36.6 C)  TempSrc: Oral Oral  Oral  SpO2: 96% 98% 96% 99%  Weight:      Height:       General exam: Appears calm and comfortable  Respiratory system: Diffuse wheezes. Respiratory effort normal. Cardiovascular system: S1 & S2 heard, RRR. No JVD, murmurs, rubs, gallops or clicks. No pedal edema. Gastrointestinal system: Abdomen is nondistended, soft and nontender. No organomegaly or masses felt. Normal bowel sounds heard. Central nervous system: Alert and oriented. No focal neurological deficits. Extremities: Symmetric 5 x 5 power. Skin: No rashes, lesions or ulcers Psychiatry: Judgement and insight appear normal. Mood & affect appropriate.    Data Reviewed:  Lab results reviewed.  Family Communication: Called the hospital and granddaughter multiple times, no response.  Disposition: Status is: Inpatient Remains inpatient appropriate because: Severity of disease.  Time spent: 35 minutes  Author: Marrion Coy, MD 12/30/2023 12:13 PM  For on call review [email or web address].

## 2023-12-30 NOTE — Progress Notes (Signed)
 Physical Therapy Treatment Patient Details Name: Amanda [name] MRN: [id number] DOB: 08-10-1941 Today's Date: 12/30/2023   History of Present Illness Pt admitted for Acute Resp failure with complaints of unresponsiveness and AMS. History includes HTN, HLD, anemi, CKD stage 3, dementia, and broncitis.    PT Comments  Pt in room, fully dressed stating she is going home today. Pt called family member to pick her up during modified PT session, Nsg & MD aware. Pt completed mobility in room without assistive device, basically "furniture walking". Pt declined using RW, too anxious about misconception of going home today. Overall, she is functioning at a Supervision/ModI level with mobility. Will continue to progress and encourage increased mobility.    If plan is discharge home, recommend the following: A little help with walking and/or transfers;A little help with bathing/dressing/bathroom;Supervision due to cognitive status   Can travel by private vehicle        Equipment Recommendations  Rolling walker (2 wheels)    Recommendations for Other Services       Precautions / Restrictions Precautions Precautions: Fall Recall of Precautions/Restrictions: Intact Restrictions Weight Bearing Restrictions Per Provider Order: No     Mobility  Bed Mobility Overal bed mobility: Needs Assistance Bed Mobility: Supine to Sit     Supine to sit: Supervision          Transfers Overall transfer level: Needs assistance Equipment used: None Transfers: Sit to/from Stand Sit to Stand: Supervision                Ambulation/Gait Ambulation/Gait assistance: Supervision Gait Distance (Feet): 40 Feet Assistive device: Rolling walker (2 wheels), 1 person hand held assist Gait Pattern/deviations: Step-through pattern       General Gait Details: Ambulated in room with Supervision with and without RW. "Furniture Walking" w/o AD   Engineer, production Rankin (Stroke Patients Only)       Balance Overall balance assessment: Needs assistance Sitting-balance support: Feet supported Sitting balance-Leahy Scale: Good     Standing balance support: Bilateral upper extremity supported Standing balance-Leahy Scale: Good                              Communication Communication Communication: No apparent difficulties  Cognition Arousal: Alert Behavior During Therapy: Restless   PT - Cognitive impairments: Memory, Awareness                       PT - Cognition Comments: pt alert and oriented to person/place- however poor safety awareness and memory of events leading to hospitalization Following commands: Intact      Cueing Cueing Techniques: Verbal cues  Exercises Other Exercises Other Exercises: pt on BSC upon arrival. Pt able to perform hygiene safely.    General Comments General comments (skin integrity, edema, etc.): Pt states she is leaving today and doesn't want to leave the room. Nursing states pt is confused,      Pertinent Vitals/Pain Pain Assessment Pain Assessment: No/denies pain    Home Living                          Prior Function            PT Goals (current goals can now be found in the care plan section) Acute Rehab  PT Goals Patient Stated Goal: to go home Progress towards PT goals: Progressing toward goals    Frequency    Min 1X/week      PT Plan      Co-evaluation              AM-PAC PT "6 Clicks" Mobility   Outcome Measure  Help needed turning from your back to your side while in a flat bed without using bedrails?: None Help needed moving from lying on your back to sitting on the side of a flat bed without using bedrails?: None Help needed moving to and from a bed to a chair (including a wheelchair)?: A Little Help needed standing up from a chair using your arms (e.g., wheelchair or bedside chair)?: A Little Help needed to walk in  hospital room?: A Little Help needed climbing 3-5 steps with a railing? : A Little 6 Click Score: 20    End of Session Equipment Utilized During Treatment: Gait belt Activity Tolerance: Patient tolerated treatment well Patient left: in chair;with call bell/phone within reach Nurse Communication: Mobility status;Other (comment) (Pt called family to come pick her up) PT Visit Diagnosis: Muscle weakness (generalized) (M62.81);Difficulty in walking, not elsewhere classified (R26.2)     Time: 1610-9604 PT Time Calculation (min) (ACUTE ONLY): 19 min  Charges:    $Therapeutic Activity: 8-22 mins PT General Charges $$ ACUTE PT VISIT: 1 Visit                    Zadie Cleverly, PTA  Amanda [name] 12/30/2023, 1:27 PM

## 2023-12-30 NOTE — TOC Initial Note (Signed)
 Transition of Care [facility]) - Initial/Assessment Note    Patient Details  Name: Amanda [name] MRN: [id number] Date of Birth: April 22, 1941  [facility]) CM/SW Contact:    Chapman Fitch, RN Phone Number: 12/30/2023, 9:40 AM  Clinical Narrative:                  Patient A&O x2 Number on file for Granddaughter is not a working number Per PT note spouse is currently at [facility] Call placed to Mr Godar, he states that he is no longer at SNF, that he discharged a few weeks ago.  Attempted to talk to him about the recs for home health, he states "I'm headed out the door to go to the doctor, ill have to talk to you later" TOC to follow up        Patient Goals and CMS Choice            Expected Discharge Plan and Services                                              Prior Living Arrangements/Services                       Activities of Daily Living   ADL Screening (condition at time of admission) Independently performs ADLs?: No Does the patient have a NEW difficulty with bathing/dressing/toileting/self-feeding that is expected to last >3 days?: No Does the patient have a NEW difficulty with getting in/out of bed, walking, or climbing stairs that is expected to last >3 days?: No Does the patient have a NEW difficulty with communication that is expected to last >3 days?: No Is the patient deaf or have difficulty hearing?: No Does the patient have difficulty seeing, even when wearing glasses/contacts?: No Does the patient have difficulty concentrating, remembering, or making decisions?: No  Permission Sought/Granted                  Emotional Assessment              Admission diagnosis:  Dehydration [E86.0] Confusion [R41.0] Acute respiratory failure with hypoxia (HCC) [J96.01] Hypotension, unspecified hypotension type [I95.9] Altered mental status, unspecified altered mental status type [R41.82] Acute respiratory failure with  hypoxemia (HCC) [J96.01] Acute renal failure superimposed on chronic kidney disease, unspecified acute renal failure type, unspecified CKD stage (HCC) [N17.9, N18.9] Patient Active Problem List   Diagnosis Date Noted   Hypomagnesemia 12/30/2023   Hyponatremia 12/30/2023   Metabolic acidosis 12/30/2023   Acute bronchitis due to respiratory syncytial virus (RSV) 12/29/2023   CKD stage 3a, GFR 45-59 ml/min (HCC) 12/29/2023   Acute respiratory failure with hypoxia (HCC) 12/28/2023   Acute respiratory failure with hypoxemia (HCC) 12/28/2023   Generalized abdominal pain 09/04/2023   Hydroureteronephrosis 09/04/2023   Prediabetes 09/03/2023   Chronic kidney disease, stage 3b (HCC) 09/03/2023   Generalized weakness 09/03/2023   Dementia (HCC) 09/03/2023   Acute metabolic encephalopathy 05/21/2022   Chronic back pain 05/21/2022   Nephrolithiasis 05/21/2022   Dehydration 05/21/2022   Vitamin D deficiency 05/21/2022   Elevated troponin 05/21/2022   Mixed hyperlipidemia 05/21/2022   Hyperkalemia 05/20/2022   Pain management contract terminated 04/09/2022   Chronic pain of both shoulders 03/18/2022   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   Spinal stenosis of lumbar region with neurogenic claudication 03/18/2022  Lumbar facet arthropathy 03/18/2022   Chronic radicular lumbar pain 03/18/2022   Chronic pain syndrome 03/18/2022   History of bilateral hip replacements 03/04/2018   Primary localized osteoarthrosis of left hip 05/12/2017   Diarrhea 01/09/2016   Dyspnea 07/04/2015   Essential hypertension 07/04/2015   Pre-op evaluation 06/08/2015   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   Absolute anemia 02/24/2015   Acute bronchitis 02/24/2015   Fracture of styloid process of radius 04/13/2014   PCP:  Ailene Ravel, MD Pharmacy:   [phone] [facility], [place] - [address] AT [facility] [address] [place] [address] Phone: [phone] Fax:  [phone]  [facility] Delivery - [place], [place] - [address] [place] [address] Phone: [phone] Fax: [phone]     Social Drivers of Health (SDOH) Social History: SDOH Screenings   Food Insecurity: No Food Insecurity (12/28/2023)  Housing: Low Risk  (12/28/2023)  Transportation Needs: No Transportation Needs (12/28/2023)  Utilities: Not At Risk (12/28/2023)  Depression (PHQ2-9): Low Risk  (04/09/2022)  Financial Resource Strain: Low Risk  (06/02/2023)   Received from [facility]  Social Connections: Moderately Integrated (12/28/2023)  Tobacco Use: Medium Risk (12/28/2023)   SDOH Interventions:     Readmission Risk Interventions     No data to display

## 2023-12-30 NOTE — Plan of Care (Signed)

## 2023-12-31 ENCOUNTER — Inpatient Hospital Stay: Payer: Medicare Other

## 2023-12-31 DIAGNOSIS — J9601 Acute respiratory failure with hypoxia: Secondary | ICD-10-CM | POA: Diagnosis not present

## 2023-12-31 DIAGNOSIS — G9341 Metabolic encephalopathy: Secondary | ICD-10-CM | POA: Diagnosis not present

## 2023-12-31 DIAGNOSIS — J205 Acute bronchitis due to respiratory syncytial virus: Secondary | ICD-10-CM | POA: Diagnosis not present

## 2023-12-31 LAB — BASIC METABOLIC PANEL
Anion gap: 11 (ref 5–15)
BUN: 35 mg/dL — ABNORMAL HIGH (ref 8–23)
CO2: 23 mmol/L (ref 22–32)
Calcium: 8.4 mg/dL — ABNORMAL LOW (ref 8.9–10.3)
Chloride: 103 mmol/L (ref 98–111)
Creatinine, Ser: 1.2 mg/dL — ABNORMAL HIGH (ref 0.44–1.00)
GFR, Estimated: 45 mL/min — ABNORMAL LOW (ref >60)
Glucose, Bld: 99 mg/dL (ref 70–99)
Potassium: 3.9 mmol/L (ref 3.5–5.1)
Sodium: 137 mmol/L (ref 135–145)

## 2023-12-31 LAB — PHOSPHORUS: Phosphorus: 2.6 mg/dL (ref 2.5–4.6)

## 2023-12-31 MED ORDER — ALPRAZOLAM 0.5 MG PO TABS
0.5000 mg | ORAL_TABLET | Freq: Once | ORAL | Status: DC
  Filled 2023-12-31: qty 1

## 2023-12-31 MED ORDER — MELATONIN 5 MG PO TABS
5.0000 mg | ORAL_TABLET | Freq: Every day | ORAL | Status: DC
  Administered 2024-01-01 – 2024-01-03 (×3): 5 mg via ORAL
  Filled 2023-12-31 (×3): qty 1

## 2023-12-31 MED ORDER — HALOPERIDOL LACTATE 5 MG/ML IJ SOLN
2.0000 mg | Freq: Four times a day (QID) | INTRAMUSCULAR | Status: DC | PRN
  Administered 2023-12-31: 2 mg via INTRAMUSCULAR
  Filled 2023-12-31: qty 1

## 2023-12-31 MED ORDER — IPRATROPIUM-ALBUTEROL 0.5-2.5 (3) MG/3ML IN SOLN
3.0000 mL | Freq: Four times a day (QID) | RESPIRATORY_TRACT | Status: DC
  Administered 2023-12-31 – 2024-01-01 (×5): 3 mL via RESPIRATORY_TRACT
  Filled 2023-12-31 (×6): qty 3

## 2023-12-31 MED ORDER — HALOPERIDOL LACTATE 5 MG/ML IJ SOLN
2.0000 mg | Freq: Once | INTRAMUSCULAR | Status: AC
  Administered 2023-12-31: 2 mg via INTRAMUSCULAR
  Filled 2023-12-31: qty 1

## 2023-12-31 MED ORDER — HALOPERIDOL 2 MG PO TABS: 2.0000 mg | ORAL_TABLET | Freq: Four times a day (QID) | ORAL | Status: DC | PRN

## 2023-12-31 NOTE — TOC Initial Note (Signed)
 Transition of Care [facility]) - Initial/Assessment Note    Patient Details  Name: Amanda [name] MRN: [id number] Date of Birth: 11-01-1941  Transition of Care [facility]) CM/SW Contact:    Chapman Fitch, RN Phone Number: 12/31/2023, 2:24 PM  Clinical Narrative:                  Patient with extreme risk for readmission score.  Readmission screening questions currently not available in EPIC. Medication reviewed, SNF not indicated, palliative care not consulted at this time  Received return call from husband   Admitted ION:[id number] hypertension Admitted from:home with husband  PCP: [facility] - husband states they have people near by that take them to appointments Current home health/prior home health/DME: RW, cane, BSC, wC  Therapy recommending home health.Husband states that he is active with [facility] and would like patient to be set up with them as well  Referral made to Shaun with [facility]  Expected Discharge Plan: Home w Home Health Services Barriers to Discharge: Continued Medical Work up   Patient Goals and CMS Choice            Expected Discharge Plan and Services     Post Acute Care Choice: Home Health Living arrangements for the past 2 months: Single Family Home                           HH Arranged: RN, PT, OT [facility] Agency: [facility] Date HH Agency Contacted: 12/31/23   Representative spoke with at [facility] Agency: Shaun  Prior Living Arrangements/Services Living arrangements for the past 2 months: Single Family Home Lives with:: Spouse              Current home services: DME    Activities of Daily Living   ADL Screening (condition at time of admission) Independently performs ADLs?: No Does the patient have a NEW difficulty with bathing/dressing/toileting/self-feeding that is expected to last >3 days?: No Does the patient have a NEW difficulty with getting in/out of bed, walking, or climbing stairs that is expected to last >3 days?:  No Does the patient have a NEW difficulty with communication that is expected to last >3 days?: No Is the patient deaf or have difficulty hearing?: No Does the patient have difficulty seeing, even when wearing glasses/contacts?: No Does the patient have difficulty concentrating, remembering, or making decisions?: No  Permission Sought/Granted                  Emotional Assessment              Admission diagnosis:  Dehydration [E86.0] Confusion [R41.0] Acute respiratory failure with hypoxia (HCC) [J96.01] Hypotension, unspecified hypotension type [I95.9] Altered mental status, unspecified altered mental status type [R41.82] Acute respiratory failure with hypoxemia (HCC) [J96.01] Acute renal failure superimposed on chronic kidney disease, unspecified acute renal failure type, unspecified CKD stage (HCC) [N17.9, N18.9] Patient Active Problem List   Diagnosis Date Noted   Hypomagnesemia 12/30/2023   Hyponatremia 12/30/2023   Metabolic acidosis 12/30/2023   Hypophosphatemia 12/30/2023   Class 1 obesity 12/30/2023   Acute bronchitis due to respiratory syncytial virus (RSV) 12/29/2023   CKD stage 3a, GFR 45-59 ml/min (HCC) 12/29/2023   Acute respiratory failure with hypoxia (HCC) 12/28/2023   Acute respiratory failure with hypoxemia (HCC) 12/28/2023   Generalized abdominal pain 09/04/2023   Hydroureteronephrosis 09/04/2023   Prediabetes 09/03/2023   Chronic kidney disease, stage 3b (HCC) 09/03/2023   Generalized  weakness 09/03/2023   Dementia (HCC) 09/03/2023   Acute metabolic encephalopathy 05/21/2022   Chronic back pain 05/21/2022   Nephrolithiasis 05/21/2022   Dehydration 05/21/2022   Vitamin D deficiency 05/21/2022   Elevated troponin 05/21/2022   Mixed hyperlipidemia 05/21/2022   Hyperkalemia 05/20/2022   Pain management contract terminated 04/09/2022   Chronic pain of both shoulders 03/18/2022   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   Spinal stenosis of  lumbar region with neurogenic claudication 03/18/2022   Lumbar facet arthropathy 03/18/2022   Chronic radicular lumbar pain 03/18/2022   Chronic pain syndrome 03/18/2022   History of bilateral hip replacements 03/04/2018   Primary localized osteoarthrosis of left hip 05/12/2017   Diarrhea 01/09/2016   Dyspnea 07/04/2015   Essential hypertension 07/04/2015   Pre-op evaluation 06/08/2015   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 AT [facility] [address] [place] [address] Phone: [phone] Fax: [phone]  [facility] Delivery - [place], [place] - [address] [place] [address] Phone: [phone] Fax: [phone]     Social Drivers of Health (SDOH) Social History: SDOH Screenings   Food Insecurity: No Food Insecurity (12/28/2023)  Housing: Low Risk  (12/28/2023)  Transportation Needs: No Transportation Needs (12/28/2023)  Utilities: Not At Risk (12/28/2023)  Depression (PHQ2-9): Low Risk  (04/09/2022)  Financial Resource Strain: Low Risk  (06/02/2023)   Received from [facility]  Social Connections: Moderately Integrated (12/28/2023)  Tobacco Use: Medium Risk (12/28/2023)   SDOH Interventions:     Readmission Risk Interventions     No data to display

## 2023-12-31 NOTE — Progress Notes (Signed)
 Pt is agitated, climbing out of bed, hallucinations and not easily reoriented.

## 2023-12-31 NOTE — TOC Progression Note (Signed)
 Transition of Care [facility]) - Progression Note    Patient Details  Name: Amanda [name] MRN: [id number] Date of Birth: [birth year]  Transition of Care [facility]) CM/SW Contact  Chapman Fitch, RN Phone Number: 12/31/2023, 12:19 PM  Clinical Narrative:      Vm left for husband to discuss recs and disposition       Expected Discharge Plan and Services                                               Social Determinants of Health (SDOH) Interventions SDOH Screenings   Food Insecurity: No Food Insecurity (12/28/2023)  Housing: Low Risk  (12/28/2023)  Transportation Needs: No Transportation Needs (12/28/2023)  Utilities: Not At Risk (12/28/2023)  Depression (PHQ2-9): Low Risk  (04/09/2022)  Financial Resource Strain: Low Risk  (06/02/2023)   Received from [facility]  Social Connections: Moderately Integrated (12/28/2023)  Tobacco Use: Medium Risk (12/28/2023)    Readmission Risk Interventions     No data to display

## 2023-12-31 NOTE — Progress Notes (Signed)
 Patient refused to take treatment

## 2023-12-31 NOTE — Progress Notes (Signed)
 PT Cancellation Note  Patient Details Name: Amanda [name] MRN: [id number] DOB: Dec 17, 1940   Cancelled Treatment:     Pt received Haldol for agitation and confusion. Unable tolerate skilled PT services at this time. Will re-attempt next available date/time per POC.    Jannet Askew 12/31/2023, 2:33 PM

## 2023-12-31 NOTE — Care Management Important Message (Signed)
 Important Message  Patient Details  Name: Amanda [name] MRN: [id number] Date of Birth: 06-14-1941   Important Message Given:  Yes - Medicare IM     Cristela Blue, CMA 12/31/2023, 10:29 AM

## 2023-12-31 NOTE — Progress Notes (Signed)
 Progress Note   [name] ZOX:[id number] DOB: [birth year] DOA: 12/27/2023     3 DOS: the patient was seen and examined on 12/31/2023   Brief hospital course:  Mrs. Rotondo, an 83 y/o with h/o HTN, HLD, anemia, CKD 3a, dementia, chronic back pain, remote h/o asthma, h/o bronchitis in the past developed acute mental status changes at approximately [facility] 12/27/23  Upon arriving hospital, patient had significant hypoxemia with oxygen saturation 80%, with blood pressure 70/30. She was found to have RSV positive, she was placed on high flow oxygen.  She also had a significant bronchospasm, was placed on IV steroids.  Condition improving, off oxygen, blood pressure better.  Medically stable for discharge.  Will continue as needed and scheduled bronchodilator and steroids.   Principal Problem:   Acute respiratory failure with hypoxemia (HCC) Active Problems:   Acute metabolic encephalopathy   Acute respiratory failure with hypoxia (HCC)   Dementia (HCC)   Essential hypertension   Acute bronchitis due to respiratory syncytial virus (RSV)   CKD stage 3a, GFR 45-59 ml/min (HCC)   Hypomagnesemia   Hyponatremia   Metabolic acidosis   Hypophosphatemia   Class 1 obesity   Assessment and Plan:   Acute respiratory failure with hypoxia (HCC) Acute bronchitis secondary to RSV infection.  Patient with hypoxemia with O2 sat in the low 80/s on room air.  Patient had some shortness of breath at that time.  CXR w/o infiltrates, effusion, edema. ON exam coarse rhonchi and expiratory wheezing with prolonged expiratory phase. No evidence of bacterial infection with no fever, no leukocytosis. Viral respiratory panel positive for RSV. 2/19. Patient condition improving, off oxygen, however, patient still has significant amount of bronchospasm.  Will continue steroids and DuoNeb. Patient currently off oxygen. Still has some bronchospasm, cough.  Will increase the frequency of bronchodilator, continue  prednisone 20 mg twice a day.    Acute metabolic encephalopathy secondary to RSV infection. Dementia (HCC) with delirium. 2/20.  Patient did not sleep well last night, has a worsening confusion, agitation and visual hallucination.  This is caused by acute metabolic encephalopathy with dementia.  Will start Haldol as needed, also giving sleeping aids with Seroquel and melatonin.  Also obtain CT head to rule out acute changes.   Chronic kidney disease stage IIIa. Hyponatremia. Acute kidney injury ruled out. Metabolic acidosis. Hypophosphatemia. Electrolytes are normalized, renal function stable.   Essential hypertension Transient hypotension. Patient had significant hypotension at time of admission, resolved after fluids.  Hold off all blood pressure medicines.      Subjective:  Patient did not sleep last night, has significant agitation today with visual hallucination.  Physical Exam: Vitals:   12/30/23 1930 12/30/23 2011 12/31/23 0329 12/31/23 0756  BP: 130/70  (!) 149/84 (!) 133/119  Pulse: 65  63 72  Resp: 16  20 20   Temp: 98 F (36.7 C)  (!) 97.2 F (36.2 C)   TempSrc: Oral  Oral   SpO2: 94% 93% 96% 95%  Weight:      Height:       General exam: Appears calm and comfortable  Respiratory system: Clear to auscultation. Respiratory effort normal. Cardiovascular system: S1 & S2 heard, RRR. No JVD, murmurs, rubs, gallops or clicks. No pedal edema. Gastrointestinal system: Abdomen is nondistended, soft and nontender. No organomegaly or masses felt. Normal bowel sounds heard. Central nervous system: Alert and confused, agitated.  Visual hallucination. Extremities: Symmetric 5 x 5 power. Skin: No rashes, lesions or ulcers Psychiatry: Agitated.  Data Reviewed:  Lab results reviewed.  Family Communication: Husband updated over the phone.  Disposition: Status is: Inpatient Remains inpatient appropriate because: Severity of disease, altered mental status.     Time  spent: 50 minutes  Author: Marrion Coy, MD 12/31/2023 10:47 AM  For on call review [email or web address].

## 2023-12-31 NOTE — Plan of Care (Signed)
   Problem: Education: Goal: Knowledge of General Education information will improve Description Including pain rating scale, medication(s)/side effects and non-pharmacologic comfort measures Outcome: Progressing   Problem: Health Behavior/Discharge Planning: Goal: Ability to manage health-related needs will improve Outcome: Progressing

## 2023-12-31 NOTE — Plan of Care (Signed)

## 2024-01-01 DIAGNOSIS — J205 Acute bronchitis due to respiratory syncytial virus: Secondary | ICD-10-CM | POA: Diagnosis not present

## 2024-01-01 DIAGNOSIS — G9341 Metabolic encephalopathy: Secondary | ICD-10-CM | POA: Diagnosis not present

## 2024-01-01 DIAGNOSIS — J9601 Acute respiratory failure with hypoxia: Secondary | ICD-10-CM | POA: Diagnosis not present

## 2024-01-01 LAB — CULTURE, BLOOD (ROUTINE X 2)
Culture: NO GROWTH
Culture: NO GROWTH
Special Requests: ADEQUATE

## 2024-01-01 MED ORDER — FLUTICASONE FUROATE-VILANTEROL 200-25 MCG/ACT IN AEPB
1.0000 | INHALATION_SPRAY | Freq: Every day | RESPIRATORY_TRACT | Status: DC
  Administered 2024-01-01 – 2024-01-04 (×4): 1 via RESPIRATORY_TRACT
  Filled 2024-01-01: qty 28

## 2024-01-01 NOTE — Plan of Care (Signed)
  Problem: Clinical Measurements: Goal: Will remain free from infection Outcome: Progressing   Problem: Clinical Measurements: Goal: Diagnostic test results will improve Outcome: Progressing   Problem: Clinical Measurements: Goal: Respiratory complications will improve Outcome: Progressing   Problem: Activity: Goal: Risk for activity intolerance will decrease Outcome: Progressing   

## 2024-01-01 NOTE — TOC Progression Note (Signed)
 Transition of Care [facility]) - Progression Note    Patient Details  Name: Amanda [name] MRN: [id number] Date of Birth: [birth year]  Transition of Care [facility]) CM/SW Contact  Chapman Fitch, RN Phone Number: 01/01/2024, 3:54 PM  Clinical Narrative:     Plan remains for patient to return home with home health services through [facility] at discharge.  Patient currently on acute O2. If patient requires O2 at discharge will nee qualifying sat and order  Expected Discharge Plan: Home w Home Health Services Barriers to Discharge: Continued Medical Work up  Expected Discharge Plan and Services     Post Acute Care Choice: Home Health Living arrangements for the past 2 months: Single Family Home                           HH Arranged: RN, PT, OT [facility] Agency: [facility] Date HH Agency Contacted: 12/31/23   Representative spoke with at [facility] Agency: Shaun   Social Determinants of Health (SDOH) Interventions SDOH Screenings   Food Insecurity: No Food Insecurity (12/28/2023)  Housing: Low Risk  (12/28/2023)  Transportation Needs: No Transportation Needs (12/28/2023)  Utilities: Not At Risk (12/28/2023)  Depression (PHQ2-9): Low Risk  (04/09/2022)  Financial Resource Strain: Low Risk  (06/02/2023)   Received from [facility]  Social Connections: Moderately Integrated (12/28/2023)  Tobacco Use: Medium Risk (12/28/2023)    Readmission Risk Interventions     No data to display

## 2024-01-01 NOTE — Plan of Care (Signed)

## 2024-01-01 NOTE — Progress Notes (Signed)
 Progress Note   [name] [id number] DOB: September 30, 1941 DOA: 12/27/2023     4 DOS: the patient was seen and examined on 01/01/2024   Brief hospital course:  Amanda [name], an 83 y/o with h/o HTN, HLD, anemia, CKD 3a, dementia, chronic back pain, remote h/o asthma, h/o bronchitis in the past developed acute mental status changes at approximately [facility] 12/27/23  Upon arriving hospital, patient had significant hypoxemia with oxygen saturation 80%, with blood pressure 70/30. She was found to have RSV positive, she was placed on high flow oxygen.  She also had a significant bronchospasm, was placed on IV steroids.  Condition improving, off oxygen, blood pressure better.  Medically stable for discharge.  Will continue as needed and scheduled bronchodilator and steroids.   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   Patient with hypoxemia with O2 sat in the low 80/s on room air.  Patient had some shortness of breath at that time.  CXR w/o infiltrates, effusion, edema. ON exam coarse rhonchi and expiratory wheezing with prolonged expiratory phase. No evidence of bacterial infection with no fever, no leukocytosis. Viral respiratory panel positive for RSV. 2/19. Patient condition improving, off oxygen, however, patient still has significant amount of bronchospasm.  Will continue steroids and DuoNeb. Patient currently off oxygen. 2/20.Still has some bronchospasm, cough.  Will increase the frequency of bronchodilator,  continue prednisone 20 mg twice a day. 2/21.  Breo for continued bronchospasm.     Acute metabolic encephalopathy secondary to RSV infection. Dementia (HCC) with delirium. 2/20.  Patient did not sleep well last night, has a worsening confusion, agitation and visual hallucination.  This is caused by acute metabolic encephalopathy with dementia.  Will start Haldol as needed, also giving sleeping aids with Seroquel and melatonin.   2/21.  Canceled the CT scan order, it was not done, but mental status improved today after a good night sleep.  No need for head CT.   Chronic kidney disease stage IIIa. Hyponatremia. Acute kidney injury ruled out. Metabolic acidosis. Hypophosphatemia. Electrolytes are normalized, renal function stable.   Essential hypertension Transient hypotension. Patient had significant hypotension at time of admission, resolved after fluids.  Hold off all blood pressure medicines.      Subjective:  Patient slept well last night, no longer has any agitation or hallucination. Still has significant amount of bronchospasm, however, patient does not feel short of breath anymore.  Physical Exam: Vitals:   12/31/23 2054 01/01/24 0153 01/01/24 0318 01/01/24 0825  BP: (!) 142/92  (!) 165/94 (!) 159/78  Pulse: 63  70 70  Resp: 18  18 19   Temp: 97.6 F (36.4 C)  (!) 97.3 F (36.3 C) 98.2 F (36.8 C)  TempSrc: Oral  Axillary Oral  SpO2:  94% 96% 97%  Weight:      Height:       General exam: Appears calm and comfortable  Respiratory system: Diffuse wheezes. Respiratory effort normal. Cardiovascular system: S1 & S2 heard, RRR. No JVD, murmurs, rubs, gallops or clicks. No pedal edema.  Gastrointestinal system: Abdomen is nondistended, soft and nontender. No organomegaly or masses felt. Normal bowel sounds heard. Central nervous system: Alert and oriented x2. No focal neurological deficits. Extremities: Symmetric 5 x 5 power. Skin: No rashes, lesions or  ulcers Psychiatry: Mood & affect appropriate.    Data Reviewed:  Lab results reviewed.  Family Communication: None  Disposition: Status is: Inpatient Remains inpatient appropriate because: Severity of disease.     Time spent: 35 minutes  Author: Marrion Coy, MD 01/01/2024 12:25 PM  For on call review [email or web address].

## 2024-01-01 NOTE — Progress Notes (Signed)
 Physical Therapy Treatment Patient Details Name: Amanda [name] MRN: [id number] DOB: 05-16-1941 Today's Date: 01/01/2024   History of Present Illness Pt admitted for Acute Resp failure with complaints of unresponsiveness and AMS. History includes HTN, HLD, anemi, CKD stage 3, dementia, and broncitis.    PT Comments  Pt received in bed, now on 2L O2 with SpO2 remaining at 95% throughout session. Pt appears fatigued/lethargic possibly due to yesterday's dose of Haldol for agitation and decreased activity level. She was able to transfer to EOB without physical assist. Gait training completed in hallway with RW and CGA, slow sluggish like cadence with increased c/o fatigue upon completion. Limited tolerance for LE therex. Anticipate strength and endurance will improve. Will continue to progress per POC.    If plan is discharge home, recommend the following: A little help with walking and/or transfers;A little help with bathing/dressing/bathroom;Supervision due to cognitive status   Can travel by private vehicle        Equipment Recommendations  Rolling walker (2 wheels)    Recommendations for Other Services       Precautions / Restrictions Precautions Precautions: Fall Recall of Precautions/Restrictions: Intact Restrictions Weight Bearing Restrictions Per Provider Order: No     Mobility  Bed Mobility Overal bed mobility: Needs Assistance Bed Mobility: Supine to Sit     Supine to sit: Supervision, HOB elevated, Used rails     General bed mobility comments: safe technique with ease of transfer    Transfers Overall transfer level: Needs assistance Equipment used: Rolling walker (2 wheels) Transfers: Sit to/from Stand Sit to Stand: Contact guard assist           General transfer comment: Follows commands with increased time due to fatigue    Ambulation/Gait Ambulation/Gait assistance: Contact guard assist Gait Distance (Feet): 150 Feet Assistive device: Rolling walker  (2 wheels), 1 person hand held assist Gait Pattern/deviations: Step-through pattern, Decreased step length - right, Decreased step length - left, Drifts right/left Gait velocity: decreased     General Gait Details: Increased fatigue with gait training, not as steady as previous session prior to agitation and Haldol given yesterday   Stairs             Wheelchair Mobility     Tilt Bed    Modified Rankin (Stroke Patients Only)       Balance Overall balance assessment: Needs assistance Sitting-balance support: Feet supported Sitting balance-Leahy Scale: Good     Standing balance support: Bilateral upper extremity supported, During functional activity, Reliant on assistive device for balance Standing balance-Leahy Scale: Fair Standing balance comment: Pt still lethargic from recieving Haldol yesterday                            Communication Communication Communication: No apparent difficulties  Cognition Arousal: Lethargic Behavior During Therapy: WFL for tasks assessed/performed, Flat affect   PT - Cognitive impairments: Memory, Awareness                       PT - Cognition Comments: pt alert and oriented to person/place- however poor safety awareness and memory of events leading to hospitalization Following commands: Intact      Cueing Cueing Techniques: Verbal cues  Exercises General Exercises - Lower Extremity Ankle Circles/Pumps: AROM, Both, 10 reps, Seated Long Arc Quad: AROM, Both, 10 reps, Seated Hip Flexion/Marching: AROM, Both, 5 reps, Seated, Limitations (fatigued)    General Comments General comments (skin integrity,  edema, etc.):  (Pt now on 2L O2 remaining at 95% at rest and post gait training.)      Pertinent Vitals/Pain Pain Assessment Pain Assessment: No/denies pain    Home Living                          Prior Function            PT Goals (current goals can now be found in the care plan section)  Acute Rehab PT Goals Patient Stated Goal: to go home    Frequency    Min 1X/week      PT Plan      Co-evaluation              AM-PAC PT "6 Clicks" Mobility   Outcome Measure  Help needed turning from your back to your side while in a flat bed without using bedrails?: None Help needed moving from lying on your back to sitting on the side of a flat bed without using bedrails?: None Help needed moving to and from a bed to a chair (including a wheelchair)?: A Little Help needed standing up from a chair using your arms (e.g., wheelchair or bedside chair)?: A Little Help needed to walk in hospital room?: A Little Help needed climbing 3-5 steps with a railing? : A Little 6 Click Score: 20    End of Session Equipment Utilized During Treatment: Gait belt;Oxygen (2L O2) Activity Tolerance: Patient limited by fatigue Patient left: in chair;with call bell/phone within reach;with chair alarm set;with nursing/sitter in room Nurse Communication: Mobility status;Other (comment) (SpO2) PT Visit Diagnosis: Muscle weakness (generalized) (M62.81);Difficulty in walking, not elsewhere classified (R26.2)     Time: 1336-1400 PT Time Calculation (min) (ACUTE ONLY): 24 min  Charges:    $Gait Training: 8-22 mins $Therapeutic Exercise: 8-22 mins PT General Charges $$ ACUTE PT VISIT: 1 Visit                    Zadie Cleverly, PTA  Jannet Askew 01/01/2024, 3:05 PM

## 2024-01-02 DIAGNOSIS — N1831 Chronic kidney disease, stage 3a: Secondary | ICD-10-CM | POA: Diagnosis not present

## 2024-01-02 DIAGNOSIS — G9341 Metabolic encephalopathy: Secondary | ICD-10-CM | POA: Diagnosis not present

## 2024-01-02 DIAGNOSIS — J9601 Acute respiratory failure with hypoxia: Secondary | ICD-10-CM | POA: Diagnosis not present

## 2024-01-02 DIAGNOSIS — F0390 Unspecified dementia without behavioral disturbance: Secondary | ICD-10-CM | POA: Diagnosis not present

## 2024-01-02 MED ORDER — IPRATROPIUM-ALBUTEROL 0.5-2.5 (3) MG/3ML IN SOLN
3.0000 mL | Freq: Two times a day (BID) | RESPIRATORY_TRACT | Status: DC
  Administered 2024-01-02: 3 mL via RESPIRATORY_TRACT

## 2024-01-02 MED ORDER — IPRATROPIUM-ALBUTEROL 0.5-2.5 (3) MG/3ML IN SOLN
3.0000 mL | Freq: Three times a day (TID) | RESPIRATORY_TRACT | Status: DC
  Administered 2024-01-02: 3 mL via RESPIRATORY_TRACT
  Filled 2024-01-02: qty 3

## 2024-01-02 MED ORDER — IPRATROPIUM-ALBUTEROL 0.5-2.5 (3) MG/3ML IN SOLN
3.0000 mL | Freq: Four times a day (QID) | RESPIRATORY_TRACT | Status: DC | PRN
  Administered 2024-01-03: 3 mL via RESPIRATORY_TRACT
  Filled 2024-01-02 (×2): qty 3

## 2024-01-02 NOTE — Plan of Care (Signed)

## 2024-01-02 NOTE — Progress Notes (Signed)
 Progress Note   [name] ZOX:[id number] DOB: May 18, 1941 DOA: 12/27/2023     5 DOS: the patient was seen and examined on 01/02/2024   Brief hospital course:  Amanda [name], an 83 y/o with h/o HTN, HLD, anemia, CKD 3a, dementia, chronic back pain, remote h/o asthma, h/o bronchitis in the past developed acute mental status changes at approximately [facility] 12/27/23  Upon arriving hospital, patient had significant hypoxemia with oxygen saturation 80%, with blood pressure 70/30. She was found to have RSV positive, she was placed on high flow oxygen.  She also had a significant bronchospasm, was placed on IV steroids.  Condition improving, off oxygen, blood pressure better.  Medically stable for discharge.  Will continue as needed and scheduled bronchodilator and steroids.   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  Patient with hypoxemia with O2 sat in the low 80/s on room air.  Patient had some shortness of breath at that time.  CXR w/o infiltrates, effusion, edema. ON exam coarse rhonchi and expiratory wheezing with prolonged expiratory phase. No evidence of bacterial infection with no fever, no leukocytosis. Viral respiratory panel positive for RSV. 2/19. Patient condition improving, off oxygen, however, patient still has significant amount of bronchospasm.  Will continue steroids and DuoNeb. Patient currently off oxygen. 2/20.Still has some bronchospasm, cough.  Will increase the frequency of bronchodilator, continue  prednisone 20 mg twice a day. 2/21.  Breo for continued bronchospasm. 2/22.  Still has some cough, mild wheezing.  Continue current treatment for another day, plan to discharge home tomorrow.     Acute metabolic encephalopathy secondary to RSV infection. Dementia (HCC) with delirium. 2/20.  Patient did not sleep well last night, has a worsening confusion, agitation and visual hallucination.  This is caused by acute metabolic encephalopathy with dementia.  Will start Haldol as needed, also giving sleeping aids with Seroquel and melatonin.   2/21.  Canceled the CT scan order, it was not done, but mental status improved today after a good night sleep.  No need for head CT. 2/22.  Patient slept well again last night, no longer has any confusion.   Chronic kidney disease stage IIIa. Hyponatremia. Acute kidney injury ruled out. Metabolic acidosis. Hypophosphatemia. Electrolytes are normalized, renal function stable.   Essential hypertension Transient hypotension. Patient had significant hypotension at time of admission, resolved after fluids.  Hold off all blood pressure medicines.      Subjective:  Still has some cough with wheezing.  Otherwise feels better.  Physical Exam: Vitals:   01/01/24 2115 01/02/24 0330 01/02/24 0724 01/02/24 0815  BP:  (!) 154/72  138/74  Pulse:  (!) 54  (!) 55  Resp:  16  18  Temp:  (!) 97.4 F (36.3 C)  98 F (36.7 C)  TempSrc:      SpO2: 97% 96% 92% 96%  Weight:      Height:       General exam: Appears calm and comfortable  Respiratory system: Decreased breathing sounds with some wheezes. Respiratory effort normal. Cardiovascular  system: S1 & S2 heard, RRR. No JVD, murmurs, rubs, gallops or clicks. No pedal edema. Gastrointestinal system: Abdomen is nondistended, soft and nontender. No organomegaly or masses felt. Normal bowel sounds heard. Central nervous system: Alert and oriented. No focal neurological deficits. Extremities: Symmetric 5 x 5  power. Skin: No rashes, lesions or ulcers Psychiatry: Judgement and insight appear normal. Mood & affect appropriate.    Data Reviewed:  No new labs.  Family Communication: husband updated over the phone  Disposition: Status is: Inpatient Remains inpatient appropriate because: severity of disease     Time spent: 35 minutes  Author: Marrion Coy, MD 01/02/2024 12:12 PM  For on call review [email or web address].

## 2024-01-02 NOTE — Plan of Care (Signed)

## 2024-01-03 DIAGNOSIS — J205 Acute bronchitis due to respiratory syncytial virus: Secondary | ICD-10-CM | POA: Diagnosis not present

## 2024-01-03 DIAGNOSIS — J9601 Acute respiratory failure with hypoxia: Secondary | ICD-10-CM | POA: Diagnosis not present

## 2024-01-03 DIAGNOSIS — G9341 Metabolic encephalopathy: Secondary | ICD-10-CM | POA: Diagnosis not present

## 2024-01-03 NOTE — Progress Notes (Signed)
 Progress Note   [name] ZOX:[id number] DOB: 1941-09-08 DOA: 12/27/2023     6 DOS: the patient was seen and examined on 01/03/2024   Brief hospital course:  Amanda [name], an 83 y/o with h/o HTN, HLD, anemia, CKD 3a, dementia, chronic back pain, remote h/o asthma, h/o bronchitis in the past developed acute mental status changes at approximately [facility] 12/27/23  Upon arriving hospital, patient had significant hypoxemia with oxygen saturation 80%, with blood pressure 70/30. She was found to have RSV positive, she was placed on high flow oxygen.  She also had a significant bronchospasm, was placed on IV steroids.  Condition improving, off oxygen, blood pressure better.  Medically stable for discharge.  Will continue as needed and scheduled bronchodilator and steroids.   Principal Problem:   Acute respiratory failure with hypoxemia (HCC) Active Problems:   Acute metabolic encephalopathy   Acute respiratory failure with hypoxia (HCC)   Dementia (HCC)   Essential hypertension   Acute bronchitis due to respiratory syncytial virus (RSV)   CKD stage 3a, GFR 45-59 ml/min (HCC)   Hypomagnesemia   Hyponatremia   Metabolic acidosis   Hypophosphatemia   Class 1 obesity   Assessment and Plan:  Acute respiratory failure with hypoxia (HCC) Acute bronchitis secondary to RSV infection.  Patient with hypoxemia with O2 sat in the low 80/s on room air.  Patient had some shortness of breath at that time.  CXR w/o infiltrates, effusion, edema. ON exam coarse rhonchi and expiratory wheezing with prolonged expiratory phase. No evidence of bacterial infection with no fever, no leukocytosis. Viral respiratory panel positive for RSV. 2/19. Patient condition improving, off oxygen, however, patient still has significant amount of bronchospasm.  Will continue steroids and DuoNeb. Patient currently off oxygen. 2/20.Still has some bronchospasm, cough.  Will increase the frequency of bronchodilator,  continue prednisone 20 mg twice a day. 2/21.  Breo for continued bronchospasm. Patient condition has improved, however, family does not feel patient is safe to go home.  Discussed with TOC, will ask PT/OT to assess patient.  However, if still unable to qualify for rehab, patient may have to be discharged home with home care.     Acute metabolic encephalopathy secondary to RSV infection. Dementia (HCC) with delirium. 2/20.  Patient did not sleep well last night, has a worsening confusion, agitation and visual hallucination.  This is caused by acute metabolic encephalopathy with dementia.  Will start Haldol as needed, also giving sleeping aids with Seroquel and melatonin.   2/21.  Canceled the CT scan order, it was not done, but mental status improved today after a good night sleep.  No need for head CT. 2/22.  Patient slept well again last night, no longer has any confusion.   Chronic kidney disease stage IIIa. Hyponatremia. Acute kidney injury ruled out. Metabolic acidosis. Hypophosphatemia. Electrolytes are normalized, renal function stable.   Essential hypertension Transient hypotension. Patient had significant hypotension at time of admission, resolved after fluids.  Hold off all blood pressure medicines.           Subjective:  Patient doing better, slept well last night.  Short of breath improved.  Physical Exam: Vitals:   01/02/24 1932 01/03/24 0337 01/03/24 0816 01/03/24 0819  BP: 132/73 (!) 147/71 (!) 144/66   Pulse: (!) 58 (!) 54 (!) 55 (!) 56  Resp: 20 18 16    Temp: 98.1 F (36.7 C) 97.7 F (36.5 C) 97.8 F (36.6 C)   TempSrc: Oral Oral    SpO2:  96% 95% (!) 89% 93%  Weight:      Height:       General exam: Appears calm and comfortable  Respiratory system: Decreased breath sounds with some with. Respiratory effort normal. Cardiovascular system: S1 & S2 heard, RRR. No JVD, murmurs, rubs, gallops or clicks. No pedal edema. Gastrointestinal system: Abdomen is  nondistended, soft and nontender. No organomegaly or masses felt. Normal bowel sounds heard. Central nervous system: Alert and oriented x2. No focal neurological deficits. Extremities: Symmetric 5 x 5 power. Skin: No rashes, lesions or ulcers Psychiatry: Judgement and insight appear normal. Mood & affect appropriate.    Data Reviewed:  Lab results reviewed.  Family Communication: Husband and granddaughter updated.  Disposition: Status is: Inpatient Remains inpatient appropriate because: Unsafe discharge option.     Time spent: 35 minutes  Author: Marrion Coy, MD 01/03/2024 1:24 PM  For on call review [email or web address].

## 2024-01-03 NOTE — Plan of Care (Signed)
  Problem: Clinical Measurements: Goal: Ability to maintain clinical measurements within normal limits will improve Outcome: Progressing   Problem: Clinical Measurements: Goal: Will remain free from infection Outcome: Progressing   Problem: Clinical Measurements: Goal: Diagnostic test results will improve Outcome: Progressing   Problem: Clinical Measurements: Goal: Respiratory complications will improve Outcome: Progressing   Problem: Activity: Goal: Risk for activity intolerance will decrease Outcome: Progressing   Problem: Nutrition: Goal: Adequate nutrition will be maintained Outcome: Progressing   Problem: Coping: Goal: Level of anxiety will decrease Outcome: Progressing

## 2024-01-03 NOTE — TOC Progression Note (Signed)
 Transition of Care [facility]) - Progression Note    Patient Details  Name: Amanda [name] MRN: [id number] Date of Birth: 07/02/1941  Transition of Care [facility]) CM/SW Contact  Rodney Langton, RN Phone Number: 01/03/2024, 4:25 PM  Clinical Narrative:     Attempted to speak with family regarding SNF request.  Family not available at bedside.  Call placed to listed number for granddaughter, recording state unable to receive calls at this time. Call placed to husband, received numbers for Amanda [name], granddaughter, and Amanda [name], grandson (demographics updated). Per Amanda [name], patient has dementia and husband was the caregiver until he was injured and went into the hospital.  He was released from SNF on the same day that the patient was admitted to the hospital.  Both Amanda [name] and Amanda [name] work, unable to provide 24/7 care, and husband physically unable to provide the care at this time.  They feel a couple weeks in rehab will help both the patient and husband heal enough so she can go back home.  They are open to considering a memory care unit in the future, but not at this time.   Will update MD and PT to inquire about another evaluation of needs.    Expected Discharge Plan: Home w Home Health Services Barriers to Discharge: Continued Medical Work up  Expected Discharge Plan and Services     Post Acute Care Choice: Home Health Living arrangements for the past 2 months: Single Family Home                           HH Arranged: RN, PT, OT [facility] Agency: [facility] Date HH Agency Contacted: 12/31/23   Representative spoke with at [facility] Agency: Shaun   Social Determinants of Health (SDOH) Interventions SDOH Screenings   Food Insecurity: No Food Insecurity (12/28/2023)  Housing: Low Risk  (12/28/2023)  Transportation Needs: No Transportation Needs (12/28/2023)  Utilities: Not At Risk (12/28/2023)  Depression (PHQ2-9): Low Risk  (04/09/2022)  Financial Resource Strain: Low Risk  (06/02/2023)   Received from  [facility]  Social Connections: Moderately Integrated (12/28/2023)  Tobacco Use: Medium Risk (12/28/2023)    Readmission Risk Interventions     No data to display

## 2024-01-04 ENCOUNTER — Encounter: Payer: Self-pay | Admitting: Oncology

## 2024-01-04 ENCOUNTER — Other Ambulatory Visit: Payer: Self-pay

## 2024-01-04 DIAGNOSIS — J9601 Acute respiratory failure with hypoxia: Secondary | ICD-10-CM | POA: Diagnosis not present

## 2024-01-04 DIAGNOSIS — J205 Acute bronchitis due to respiratory syncytial virus: Secondary | ICD-10-CM | POA: Diagnosis not present

## 2024-01-04 DIAGNOSIS — G9341 Metabolic encephalopathy: Secondary | ICD-10-CM | POA: Diagnosis not present

## 2024-01-04 LAB — CREATININE, SERUM
Creatinine, Ser: 1.34 mg/dL — ABNORMAL HIGH (ref 0.44–1.00)
GFR, Estimated: 39 mL/min — ABNORMAL LOW (ref >60)

## 2024-01-04 MED ORDER — PREDNISONE 10 MG PO TABS
ORAL_TABLET | ORAL | 0 refills | Status: AC
  Filled 2024-01-04: qty 5, 6d supply, fill #0
  Filled 2024-01-04: qty 9, 6d supply, fill #0

## 2024-01-04 MED ORDER — MELATONIN 5 MG PO TABS
5.0000 mg | ORAL_TABLET | Freq: Every day | ORAL | 0 refills | Status: AC
  Filled 2024-01-04: qty 30, 30d supply, fill #0

## 2024-01-04 MED ORDER — CARVEDILOL 6.25 MG PO TABS
3.1250 mg | ORAL_TABLET | Freq: Two times a day (BID) | ORAL | Status: DC
Start: 2024-01-04 — End: 2024-01-04
  Administered 2024-01-04: 3.125 mg via ORAL

## 2024-01-04 MED ORDER — FLUTICASONE FUROATE-VILANTEROL 200-25 MCG/ACT IN AEPB
1.0000 | INHALATION_SPRAY | Freq: Every day | RESPIRATORY_TRACT | 0 refills | Status: AC
  Filled 2024-01-04 (×2): qty 60, 60d supply, fill #0

## 2024-01-04 NOTE — TOC Transition Note (Signed)
 Transition of Care [facility]) - Discharge Note   Patient Details  Name: Amanda [name] MRN: [id number] Date of Birth: August 15, 1941  Transition of Care [facility]) CM/SW Contact:  Chapman Fitch, RN Phone Number: 01/04/2024, 11:31 AM   Clinical Narrative:     Spoke with grandson [name], and he has discussed with patient's spouse and plan is for patient to return [facility] with [facility] Patient did not qualify for home O2 [name] to provide dc transport Shaun with [facility] notified of discharge     Barriers to Discharge: Continued Medical Work up   Patient Goals and CMS Choice            Discharge Placement                       Discharge Plan and Services Additional resources added to the After Visit Summary for       Post Acute Care Choice: Home Health                    HH Arranged: RN, PT, OT [facility] Agency: [facility] Date [facility] Agency Contacted: 12/31/23   Representative spoke with at [facility] Agency: Shaun  Social Drivers of Health (SDOH) Interventions SDOH Screenings   Food Insecurity: No Food Insecurity (12/28/2023)  Housing: Low Risk  (12/28/2023)  Transportation Needs: No Transportation Needs (12/28/2023)  Utilities: Not At Risk (12/28/2023)  Depression (PHQ2-9): Low Risk  (04/09/2022)  Financial Resource Strain: Low Risk  (06/02/2023)   Received from [facility]  Social Connections: Moderately Integrated (12/28/2023)  Tobacco Use: Medium Risk (12/28/2023)     Readmission Risk Interventions     No data to display

## 2024-01-04 NOTE — Evaluation (Signed)
 Occupational Therapy Evaluation Patient Details Name: Amanda [name] MRN: [id number] DOB: Aug 21, 1941 Today's Date: 01/04/2024   History of Present Illness   Pt admitted for Acute Resp failure with complaints of unresponsiveness and AMS. History includes HTN, HLD, anemi, CKD stage 3, dementia, and broncitis.     Clinical Impressions Pt was seen for OT evaluation this date. Pt presents to acute OT demonstrating impaired ADL performance and functional mobility 2/2 decreased strength, balance, and activity tolerance (See OT problem list for additional functional deficits). Pt currently requires 1L O2, SpO2 >95%. Completes bed mobility, seated LB ADL, ADL transfers from EOB with RW, and all aspects of toileting requiring supv and increased time/effort to complete. Pt ambulated ~25' in room, endorsing 8/10 rate of perceived exertion with the effort. VSS. Pt instructed in IS use to support breath. Pt would benefit from skilled OT services to address noted impairments and functional limitations (see below for any additional details) in order to maximize safety and independence while minimizing falls risk and caregiver burden.    If plan is discharge home, recommend the following:   A little help with bathing/dressing/bathroom;Help with stairs or ramp for entrance;Assist for transportation;Assistance with cooking/housework;A little help with walking and/or transfers     Functional Status Assessment   Patient has had a recent decline in their functional status and demonstrates the ability to make significant improvements in function in a reasonable and predictable amount of time.     Equipment Recommendations   None recommended by OT     Recommendations for Other Services         Precautions/Restrictions   Precautions Precautions: Fall Recall of Precautions/Restrictions: Intact Restrictions Weight Bearing Restrictions Per Provider Order: No     Mobility Bed Mobility Overal bed  mobility: Modified Independent             General bed mobility comments: no assist required, increased effort to complete    Transfers Overall transfer level: Needs assistance Equipment used: Rolling walker (2 wheels) Transfers: Sit to/from Stand Sit to Stand: Supervision           General transfer comment: no assist required, increased effort to complete, good hand placement      Balance Overall balance assessment: Needs assistance Sitting-balance support: Feet supported Sitting balance-Leahy Scale: Good     Standing balance support: Bilateral upper extremity supported, During functional activity, Reliant on assistive device for balance Standing balance-Leahy Scale: Fair                             ADL either performed or assessed with clinical judgement   ADL Overall ADL's : Needs assistance/impaired Eating/Feeding: Independent                   Lower Body Dressing: Sitting/lateral leans;Modified independent Lower Body Dressing Details (indicate cue type and reason): donned socks wihtout direct assist EOB Toilet Transfer: Supervision/safety;Ambulation;BSC/3in1;Rolling walker (2 wheels)   Toileting- Clothing Manipulation and Hygiene: Supervision/safety;Sitting/lateral lean       Functional mobility during ADLs: Supervision/safety;Rolling walker (2 wheels)       Vision         Perception         Praxis         Pertinent Vitals/Pain Pain Assessment Pain Assessment: No/denies pain     Extremity/Trunk Assessment Upper Extremity Assessment Upper Extremity Assessment: Overall WFL for tasks assessed   Lower Extremity Assessment Lower Extremity Assessment: Generalized weakness  Communication     Cognition Arousal: Alert Behavior During Therapy: WFL for tasks assessed/performed               OT - Cognition Comments: Pt oriented x3, does not know the date                 Following commands: Intact        Cueing  General Comments   Cueing Techniques: Verbal cues      Exercises Other Exercises Other Exercises: Pt educated in IS use wiht pt able to return demo proper technique with MIN VC   Shoulder Instructions      Home Living Family/patient expects to be discharged to:: Private residence Living Arrangements: Spouse/significant other Available Help at Discharge: Family;Available PRN/intermittently Type of Home: House Home Access: Stairs to enter [employer] of Steps: 5 Entrance Stairs-Rails: Can reach both Home Layout: One level               Home Equipment: Cane - single point;BSC/3in1;Wheelchair - manual   Additional Comments: spouse is currently in SNF at this time      Prior Functioning/Environment Prior Level of Function : Independent/Modified Independent;Driving             Mobility Comments: uses SPC at baseline, reports several falls ADLs Comments: mod I    OT Problem List: Decreased strength;Decreased activity tolerance;Impaired balance (sitting and/or standing);Decreased knowledge of use of DME or AE   OT Treatment/Interventions: Self-care/ADL training;Therapeutic exercise;Therapeutic activities;Energy conservation;DME and/or AE instruction;Patient/family education;Balance training      OT Goals(Current goals can be found in the care plan section)   Acute Rehab OT Goals Patient Stated Goal: go home OT Goal Formulation: With patient Time For Goal Achievement: 01/18/24 Potential to Achieve Goals: Good ADL Goals Additional ADL Goal #1: Pt will complete all aspects of bathing with remote supervision for safety, 2/2 opportunities. Additional ADL Goal #2: Pt will verbalize plan to implement at least 1 learned ECS into daily ADL routines to maximize safety.   OT Frequency:  Min 1X/week    Co-evaluation              AM-PAC OT "6 Clicks" Daily Activity     Outcome Measure Help from another person eating meals?: None Help from  another person taking care of personal grooming?: None Help from another person toileting, which includes using toliet, bedpan, or urinal?: A Little Help from another person bathing (including washing, rinsing, drying)?: A Little Help from another person to put on and taking off regular upper body clothing?: None Help from another person to put on and taking off regular lower body clothing?: A Little 6 Click Score: 21   End of Session Equipment Utilized During Treatment: Rolling walker (2 wheels);Oxygen Nurse Communication: Mobility status  Activity Tolerance: Patient tolerated treatment well Patient left: in chair;with call bell/phone within reach;with chair alarm set  OT Visit Diagnosis: Other abnormalities of gait and mobility (R26.89);Muscle weakness (generalized) (M62.81)                Time: 9147-8295 OT Time Calculation (min): 16 min Charges:  OT General Charges $OT Visit: 1 Visit OT Evaluation $OT Eval Low Complexity: 1 Low  Arman Filter., MPH, MS, OTR/L ascom [phone] 01/04/24, 10:10 AM

## 2024-01-04 NOTE — Discharge Summary (Signed)
 Physician Discharge Summary   [name] MRN: [id number] DOB: November 27, 1940  Admit date:     12/27/2023  Discharge date: 01/04/24  Discharge Physician: Marrion Coy   PCP: Ailene Ravel, MD   Recommendations at discharge:   Follow-up with PCP 1 week.  Discharge Diagnoses: Principal Problem:   Acute respiratory failure with hypoxemia (HCC) Active Problems:   Acute metabolic encephalopathy   Acute respiratory failure with hypoxia (HCC)   Dementia (HCC)   Essential hypertension   Acute bronchitis due to respiratory syncytial virus (RSV)   CKD stage 3a, GFR 45-59 ml/min (HCC)   Hypomagnesemia   Hyponatremia   Metabolic acidosis   Hypophosphatemia   Class 1 obesity  Resolved Problems:   * No resolved hospital problems. [facility] Course:  Amanda [name], an 83 y/o with h/o HTN, HLD, anemia, CKD 3a, dementia, chronic back pain, remote h/o asthma, h/o bronchitis in the past developed acute mental status changes at approximately [facility] 12/27/23  Upon arriving hospital, patient had significant hypoxemia with oxygen saturation 80%, with blood pressure 70/30. She was found to have RSV positive, she was placed on high flow oxygen.  She also had a significant bronchospasm, was placed on IV steroids.  Patient condition had improved, medically stable for discharge.  Obtain home oxygen evaluation.  Assessment and Plan: Acute respiratory failure with hypoxia (HCC) Acute bronchitis secondary to RSV infection.  Patient with hypoxemia with O2 sat in the low 80/s on room air.  Patient had some shortness of breath at that time.  CXR w/o infiltrates, effusion, edema. ON exam coarse rhonchi and expiratory wheezing with prolonged expiratory phase. No evidence of bacterial infection with no fever, no leukocytosis. Viral respiratory panel positive for RSV. 2/19. Patient condition improving, off oxygen, however, patient still has significant amount of bronchospasm.  Will continue steroids and  DuoNeb. Patient currently off oxygen. 2/20.Still has some bronchospasm, cough.  Will increase the frequency of bronchodilator, continue prednisone 20 mg twice a day. 2/21.  Breo for continued bronchospasm. Patient has been requiring 1 L oxygen, obtain oxygen evaluation.  But condition has improved.  Spoke with TOC, there is no option for nursing home placement.  Patient be discharged home with home health.     Acute metabolic encephalopathy secondary to RSV infection. Dementia (HCC) with delirium. 2/20.  Patient did not sleep well last night, has a worsening confusion, agitation and visual hallucination.  This is caused by acute metabolic encephalopathy with dementia.  Will start Haldol as needed, also giving sleeping aids with Seroquel and melatonin.   2/21.  Canceled the CT scan order, it was not done, but mental status improved today after a good night sleep.  No need for head CT. 2/22.  Patient slept well again last night, no longer has any confusion.   Chronic kidney disease stage IIIa. Hyponatremia. Acute kidney injury ruled out. Metabolic acidosis. Hypophosphatemia. Electrolytes are normalized, renal function stable.   Essential hypertension Transient hypotension. Patient had significant hypotension at time of admission, resolved after fluids.  Restart home blood pressure medicines, but hold off diuretics due to chronic kidney disease.           Consultants: None Procedures performed: None  Disposition: Home health Diet recommendation:  Discharge Diet Orders (From admission, onward)     Start     Ordered   01/04/24 0000  Diet - low sodium heart healthy        01/04/24 1021  Cardiac diet DISCHARGE MEDICATION: Allergies as of 01/04/2024       Reactions   Other Other (See Comments)   general anesthesia=difficulty waking   Codeine Nausea And Vomiting   Penicillins Hives   Has patient had a PCN reaction causing immediate rash, facial/tongue/throat  swelling, SOB or lightheadedness with hypotension: Yes Has patient had a PCN reaction causing severe rash involving mucus membranes or skin necrosis: Unknown Has patient had a PCN reaction that required hospitalization: No Has patient had a PCN reaction occurring within the last 10 years: No If all of the above answers are "NO", then may proceed with Cephalosporin use.        Medication List     STOP taking these medications    triamterene-hydrochlorothiazide 37.5-25 MG capsule Commonly known as: DYAZIDE       TAKE these medications    albuterol 108 (90 Base) MCG/ACT inhaler Commonly known as: VENTOLIN HFA Inhale into the lungs every 6 (six) hours as needed for wheezing or shortness of breath.   aspirin EC 81 MG tablet Take 81 mg by mouth daily.   atorvastatin 20 MG tablet Commonly known as: LIPITOR Take 20 mg by mouth daily.   carvedilol 6.25 MG tablet Commonly known as: COREG Take 6.25 mg by mouth 2 (two) times daily with a meal.   citalopram 10 MG tablet Commonly known as: CELEXA Take 1 tablet by mouth daily.   cyanocobalamin 1000 MCG tablet Commonly known as: VITAMIN B12 Take 1,000 mcg by mouth daily.   dicyclomine 10 MG capsule Commonly known as: BENTYL Take 10 mg by mouth 2 (two) times daily as needed.   donepezil 5 MG tablet Commonly known as: ARICEPT Take 5 mg by mouth daily.   DULoxetine 30 MG capsule Commonly known as: CYMBALTA Take 30 mg by mouth 2 (two) times daily.   fluticasone furoate-vilanterol 200-25 MCG/ACT Aepb Commonly known as: BREO ELLIPTA Inhale 1 puff into the lungs daily. Start taking on: January 05, 2024   melatonin 5 MG Tabs Take 1 tablet (5 mg total) by mouth at bedtime.   memantine 10 MG tablet Commonly known as: NAMENDA Take 1 tablet by mouth 2 (two) times daily. What changed: Another medication with the same name was removed. Continue taking this medication, and follow the directions you see here.   predniSONE 20 MG  tablet Commonly known as: DELTASONE Take 1 tablet (20 mg total) by mouth daily with breakfast for 3 days, THEN 0.5 tablets (10 mg total) daily with breakfast for 3 days. Start taking on: January 04, 2024        Follow-up Information     Hamrick, Durward Fortes, MD Follow up in 1 week(s).   Specialty: Family Medicine Contact information: [address] [place] [address] [phone]                Discharge Exam: Filed Weights   12/28/23 0100  Weight: 82 kg   General exam: Appears calm and comfortable  Respiratory system: Decreased breath sounds. Respiratory effort normal. Cardiovascular system: S1 & S2 heard, RRR. No JVD, murmurs, rubs, gallops or clicks. No pedal edema. Gastrointestinal system: Abdomen is nondistended, soft and nontender. No organomegaly or masses felt. Normal bowel sounds heard. Central nervous system: Alert and oriented x2. No focal neurological deficits. Extremities: Symmetric 5 x 5 power. Skin: No rashes, lesions or ulcers Psychiatry: Judgement and insight appear normal. Mood & affect appropriate.    Condition at discharge: good  The results of significant  diagnostics from this hospitalization (including imaging, microbiology, ancillary and laboratory) are listed below for reference.   Imaging Studies: DG Chest Port 1 View Result Date: 12/27/2023 CLINICAL DATA:  Questionable sepsis - evaluate for abnormality Decreased oxygen saturation.  Hypotension.  Wheezing. EXAM: PORTABLE CHEST 1 VIEW COMPARISON:  02/03/2023 FINDINGS: Stable cardiomegaly. Aortic atherosclerosis. There is a hiatal hernia. No focal airspace disease, large pleural effusion or pneumothorax. On limited assessment, no acute osseous findings. IMPRESSION: 1. No acute chest findings. 2. Stable cardiomegaly. 3. Hiatal hernia. Electronically Signed   By: Narda Rutherford M.D.   On: 12/27/2023 20:33    Microbiology: Results for orders placed or performed during the hospital encounter of  12/27/23  Resp panel by RT-PCR (RSV, Flu A&B, Covid) Anterior Nasal Swab     Status: Abnormal   Collection Time: 12/27/23  8:11 PM   Specimen: Anterior Nasal Swab  Result Value Ref Range Status   SARS Coronavirus 2 by RT PCR NEGATIVE NEGATIVE Final    Comment: (NOTE) SARS-CoV-2 target nucleic acids are NOT DETECTED.  The SARS-CoV-2 RNA is generally detectable in upper respiratory specimens during the acute phase of infection. The lowest concentration of SARS-CoV-2 viral copies this assay can detect is 138 copies/mL. A negative result does not preclude SARS-Cov-2 infection and should not be used as the sole basis for treatment or other patient management decisions. A negative result may occur with  improper specimen collection/handling, submission of specimen other than nasopharyngeal swab, presence of viral mutation(s) within the areas targeted by this assay, and inadequate number of viral copies(<138 copies/mL). A negative result must be combined with clinical observations, patient history, and epidemiological information. The expected result is Negative.  Fact Sheet for Patients:  BloggerCourse.com  Fact Sheet for Healthcare Providers:  SeriousBroker.it  This test is no t yet approved or cleared by the [place] FDA and  has been authorized for detection and/or diagnosis of SARS-CoV-2 by FDA under an Emergency Use Authorization (EUA). This EUA will remain  in effect (meaning this test can be used) for the duration of the COVID-19 declaration under Section 564(b)(1) of the Act, 21 U.S.C.section 360bbb-3(b)(1), unless the authorization is terminated  or revoked sooner.       Influenza A by PCR NEGATIVE NEGATIVE Final   Influenza B by PCR NEGATIVE NEGATIVE Final    Comment: (NOTE) The Xpert Xpress SARS-CoV-2/FLU/RSV plus assay is intended as an aid in the diagnosis of influenza from Nasopharyngeal swab specimens and should  not be used as a sole basis for treatment. Nasal washings and aspirates are unacceptable for Xpert Xpress SARS-CoV-2/FLU/RSV testing.  Fact Sheet for Patients: BloggerCourse.com  Fact Sheet for Healthcare Providers: SeriousBroker.it  This test is not yet approved or cleared by the [place] FDA and has been authorized for detection and/or diagnosis of SARS-CoV-2 by FDA under an Emergency Use Authorization (EUA). This EUA will remain in effect (meaning this test can be used) for the duration of the COVID-19 declaration under Section 564(b)(1) of the Act, 21 U.S.C. section 360bbb-3(b)(1), unless the authorization is terminated or revoked.     Resp Syncytial Virus by PCR POSITIVE (A) NEGATIVE Final    Comment: (NOTE) Fact Sheet for Patients: BloggerCourse.com  Fact Sheet for Healthcare Providers: SeriousBroker.it  This test is not yet approved or cleared by the [place] FDA and has been authorized for detection and/or diagnosis of SARS-CoV-2 by FDA under an Emergency Use Authorization (EUA). This EUA will remain in effect (meaning this test can  be used) for the duration of the COVID-19 declaration under Section 564(b)(1) of the Act, 21 U.S.C. section 360bbb-3(b)(1), unless the authorization is terminated or revoked.  Performed at [facility], [address] [place], [place] [address]   Blood Culture (routine x 2)     Status: None   Collection Time: 12/27/23  8:36 PM   Specimen: BLOOD LEFT ARM  Result Value Ref Range Status   Specimen Description BLOOD LEFT ARM  Final   Special Requests Blood Culture adequate volume  Final   Culture   Final    NO GROWTH 5 DAYS Performed at [facility], [address] [place], [place] [address]    Report Status 01/01/2024 FINAL  Final  Blood Culture (routine x 2)     Status: None   Collection Time: 12/27/23   8:37 PM   Specimen: BLOOD RIGHT HAND  Result Value Ref Range Status   Specimen Description BLOOD RIGHT HAND  Final   Special Requests   Final    Blood Culture results may not be optimal due to an inadequate volume of blood received in culture bottles   Culture   Final    NO GROWTH 5 DAYS Performed at [facility], [address] [place], [place] [address]    Report Status 01/01/2024 FINAL  Final    Labs: CBC: Recent Labs  Lab 12/29/23 0504  WBC 14.3*  HGB 11.1*  HCT 32.5*  MCV 86.9  PLT 194   Basic Metabolic Panel: Recent Labs  Lab 12/29/23 0504 12/30/23 0411 12/31/23 0445 01/04/24 0359  NA 130* 132* 137  --   K 4.1 3.9 3.9  --   CL 100 101 103  --   CO2 20* 22 23  --   GLUCOSE 145* 123* 99  --   BUN 55* 44* 35*  --   CREATININE 1.43* 1.20* 1.20* 1.34*  CALCIUM 8.0* 8.1* 8.4*  --   MG 2.3 2.2  --   --   PHOS  --  1.6* 2.6  --    Liver Function Tests: Recent Labs  Lab 12/29/23 0504  AST 28  ALT 19  ALKPHOS 71  BILITOT 0.4  PROT 6.2*  ALBUMIN 2.7*   CBG: No results for input(s): "GLUCAP" in the last 168 hours.  Discharge time spent: greater than 30 minutes.  Signed: Marrion Coy, MD [facility] 01/04/2024

## 2024-01-04 NOTE — Evaluation (Signed)
 Physical Therapy Evaluation Patient Details Name: Amanda [name] MRN: [id number] DOB: 04/30/1941 Today's Date: 01/04/2024  History of Present Illness  Amanda [name] is an 82yoF who is admitted for Acute Resp failure with complaints of unresponsiveness and AMS. PMH: HTN, HLD, anemia, CKD3, dementia, and broncitis.  Clinical Impression  Pt in recliner on entry, meal tray completed. Pt agreeable to session, albeit she is minimally interactive verbally, and often does not answer questions appropriately. She does guess [facility] as her 2nd choice after [facility], and she puts some thought into responding that we are in a hospital. Pt able to AMB twice in room, on room air entire session, sats stay ~92% despite marked increased in RR to mind 30s with exertion- cough also sounds a bit wet. Pt has incontinence on floor with no awareness, and pericare warrants supervision/assistance of 1 for quality detailing. Pt appears weak and exerted, but near baseline. Would benefit from a STR stay to maximize mobility and strength prior to possible transition to LTC/memory care/back to home with 24/7 supervision. Will follow.       If plan is discharge home, recommend the following: A little help with walking and/or transfers;A little help with bathing/dressing/bathroom;Supervision due to cognitive status;Assistance with cooking/housework;Direct supervision/assist for financial management;Help with stairs or ramp for entrance;Assist for transportation   Can travel by private vehicle   No    Equipment Recommendations None recommended by PT  Recommendations for Other Services       Functional Status Assessment Patient has had a recent decline in their functional status and demonstrates the ability to make significant improvements in function in a reasonable and predictable amount of time.     Precautions / Restrictions Precautions Precautions: Fall Recall of Precautions/Restrictions:  Impaired Restrictions Weight Bearing Restrictions Per Provider Order: No      Mobility  Bed Mobility                    Transfers Overall transfer level: Needs assistance Equipment used: None Transfers: Sit to/from Stand Sit to Stand: Supervision, Contact guard assist           General transfer comment: from recliner;    Ambulation/Gait Ambulation/Gait assistance: Contact guard assist, Supervision Gait Distance (Feet): 56 Feet Assistive device: None Gait Pattern/deviations: Step-to pattern Gait velocity: slow and exerted, not apprpriate for AMB outside of household.     General Gait Details: decreased balance confidence, prefers single hand support on fixtures/furniture in room when able. (Sats on room air post AMB: 92%; RR in 30s.)  Stairs            Wheelchair Mobility     Tilt Bed    Modified Rankin (Stroke Patients Only)       Balance                                             Pertinent Vitals/Pain Pain Assessment Pain Assessment: No/denies pain    Home Living Family/patient expects to be discharged to:: Private residence Living Arrangements: Spouse/significant other (per chart, husband may still be at STR at present; other family not available for daytime assist due to work.) Available Help at Discharge: Family;Available PRN/intermittently Type of Home: House Home Access: Stairs to enter Entrance Stairs-Rails: Can reach both Entrance Stairs-Number of Steps: 5   Home Layout: One level Home Equipment: Cane - single point;BSC/3in1;Wheelchair - manual Additional Comments:  spouse is currently in SNF at this time    Prior Function Prior Level of Function : Independent/Modified Independent;Driving             Mobility Comments: uses SPC at baseline, reports several falls ADLs Comments: mod I     Extremity/Trunk Assessment   Upper Extremity Assessment Upper Extremity Assessment: Overall WFL for tasks  assessed    Lower Extremity Assessment Lower Extremity Assessment: Generalized weakness       Communication        Cognition Arousal: Alert Behavior During Therapy: WFL for tasks assessed/performed, Flat affect   PT - Cognitive impairments: Memory, Awareness                       PT - Cognition Comments: unaware of urge to void nor aware of BM on floor; Pt has poor problem solving regarding degree of bowel on hands and avoiding smearing this on objects as we AMB to BR.         Cueing       General Comments General comments (skin integrity, edema, etc.): 1L, SpO2 >95% with exertion    Exercises     Assessment/Plan    PT Assessment    PT Problem List Decreased strength;Decreased mobility;Decreased knowledge of use of DME;Decreased safety awareness       PT Treatment Interventions DME instruction;Gait training;Therapeutic activities;Therapeutic exercise;Balance training;Neuromuscular re-education;Functional mobility training;Patient/family education    PT Goals (Current goals can be found in the Care Plan section)  Acute Rehab PT Goals PT Goal Formulation: Patient unable to participate in goal setting    Frequency Min 1X/week     Co-evaluation               AM-PAC PT "6 Clicks" Mobility  Outcome Measure Help needed turning from your back to your side while in a flat bed without using bedrails?: A Little Help needed moving from lying on your back to sitting on the side of a flat bed without using bedrails?: A Little Help needed moving to and from a bed to a chair (including a wheelchair)?: A Little Help needed standing up from a chair using your arms (e.g., wheelchair or bedside chair)?: A Little Help needed to walk in hospital room?: A Little Help needed climbing 3-5 steps with a railing? : A Little 6 Click Score: 18    End of Session Equipment Utilized During Treatment: Gait belt;Oxygen Activity Tolerance: Patient limited by fatigue Patient  left: in chair;with call bell/phone within reach;with chair alarm set Nurse Communication:  (pt's gown is soiled) PT Visit Diagnosis: Muscle weakness (generalized) (M62.81);Difficulty in walking, not elsewhere classified (R26.2);Other abnormalities of gait and mobility (R26.89)    Time: 1020-1045 PT Time Calculation (min) (ACUTE ONLY): 25 min   Charges:   PT Evaluation $PT Re-evaluation: 1 Re-eval PT Treatments $Therapeutic Activity: 8-22 mins PT General Charges $$ ACUTE PT VISIT: 1 Visit        11:03 AM, 01/04/24 Rosamaria Lints, PT, DPT Physical Therapist - [facility]  Outpatient Physical Therapy- Main Campus [phone]    Amberrose Friebel C 01/04/2024, 10:58 AM

## 2024-01-04 NOTE — Progress Notes (Signed)
 AVS reviewed with grandson, Amanda [name], via telephone. Printed copy of AVS sent home with pt. Medications discussed. All questions answered to satisfaction. [name] verbalized understanding of information given. Pt to be escorted off the unit with all belongings via wheelchair by staff member.

## 2024-01-04 NOTE — Plan of Care (Signed)
  Problem: Pain Managment: Goal: General experience of comfort will improve and/or be controlled Outcome: Progressing   Problem: Safety: Goal: Ability to remain free from injury will improve Outcome: Progressing   Problem: Skin Integrity: Goal: Risk for impaired skin integrity will decrease Outcome: Progressing

## 2024-01-04 NOTE — Progress Notes (Signed)
 Med delivered to bedside by pharmacist.

## 2024-01-04 NOTE — Consult Note (Signed)
 [facility] Liaison Note  01/04/2024  Amanda [name] 1941/04/17 [id number]  Location: RN Hospital Liaison screened the patient remotely at [facility].  Insurance: [employer]   Amanda [name] is a 83 y.o. female who is a Primary Care Patient of Hamrick, Durward Fortes, MD-[facility].  The patient was screened for readmission hospitalization with noted medium risk score for unplanned readmission risk with 2 IP in 6 months.  The patient was assessed for potential Care Management service needs for post hospital transition for care coordination. Review of patient's electronic medical record reveals patient was admitted with Acute Respiratory Failure with hypoxemia. Pt discharged home today with [facility] through [facility] for ongoing needs. No anticipated needs for VBCI to address at this time.    VBCI Care Management/Population Health does not replace or interfere with any arrangements made by the Inpatient Transition of Care team.   For questions contact:   Elliot Cousin, RN, [facility] Liaison [place]   [facility], Population Health Office Hours MTWF  8:00 am-6:00 pm Direct Dial: [phone] mobile [phone] [Office toll free line] Office Hours are M-F 8:30 - 5 pm [name]

## 2024-01-06 DIAGNOSIS — J45909 Unspecified asthma, uncomplicated: Secondary | ICD-10-CM | POA: Diagnosis not present

## 2024-01-06 DIAGNOSIS — E871 Hypo-osmolality and hyponatremia: Secondary | ICD-10-CM | POA: Diagnosis not present

## 2024-01-06 DIAGNOSIS — E872 Acidosis, unspecified: Secondary | ICD-10-CM | POA: Diagnosis not present

## 2024-01-06 DIAGNOSIS — D631 Anemia in chronic kidney disease: Secondary | ICD-10-CM | POA: Diagnosis not present

## 2024-01-06 DIAGNOSIS — Z7951 Long term (current) use of inhaled steroids: Secondary | ICD-10-CM | POA: Diagnosis not present

## 2024-01-06 DIAGNOSIS — M4726 Other spondylosis with radiculopathy, lumbar region: Secondary | ICD-10-CM | POA: Diagnosis not present

## 2024-01-06 DIAGNOSIS — N1832 Chronic kidney disease, stage 3b: Secondary | ICD-10-CM | POA: Diagnosis not present

## 2024-01-06 DIAGNOSIS — Z87891 Personal history of nicotine dependence: Secondary | ICD-10-CM | POA: Diagnosis not present

## 2024-01-06 DIAGNOSIS — Z7982 Long term (current) use of aspirin: Secondary | ICD-10-CM | POA: Diagnosis not present

## 2024-01-06 DIAGNOSIS — G9341 Metabolic encephalopathy: Secondary | ICD-10-CM | POA: Diagnosis not present

## 2024-01-06 DIAGNOSIS — I129 Hypertensive chronic kidney disease with stage 1 through stage 4 chronic kidney disease, or unspecified chronic kidney disease: Secondary | ICD-10-CM | POA: Diagnosis not present

## 2024-01-06 DIAGNOSIS — J9601 Acute respiratory failure with hypoxia: Secondary | ICD-10-CM | POA: Diagnosis not present

## 2024-01-06 DIAGNOSIS — M48062 Spinal stenosis, lumbar region with neurogenic claudication: Secondary | ICD-10-CM | POA: Diagnosis not present

## 2024-01-06 DIAGNOSIS — D509 Iron deficiency anemia, unspecified: Secondary | ICD-10-CM | POA: Diagnosis not present

## 2024-01-06 DIAGNOSIS — Z7952 Long term (current) use of systemic steroids: Secondary | ICD-10-CM | POA: Diagnosis not present

## 2024-01-06 DIAGNOSIS — G894 Chronic pain syndrome: Secondary | ICD-10-CM | POA: Diagnosis not present

## 2024-01-06 DIAGNOSIS — E782 Mixed hyperlipidemia: Secondary | ICD-10-CM | POA: Diagnosis not present

## 2024-01-11 DIAGNOSIS — R531 Weakness: Secondary | ICD-10-CM | POA: Diagnosis not present

## 2024-01-11 DIAGNOSIS — Z79899 Other long term (current) drug therapy: Secondary | ICD-10-CM | POA: Diagnosis not present

## 2024-01-11 DIAGNOSIS — Z23 Encounter for immunization: Secondary | ICD-10-CM | POA: Diagnosis not present

## 2024-01-14 DIAGNOSIS — E782 Mixed hyperlipidemia: Secondary | ICD-10-CM | POA: Diagnosis not present

## 2024-01-14 DIAGNOSIS — N1832 Chronic kidney disease, stage 3b: Secondary | ICD-10-CM | POA: Diagnosis not present

## 2024-01-14 DIAGNOSIS — E871 Hypo-osmolality and hyponatremia: Secondary | ICD-10-CM | POA: Diagnosis not present

## 2024-01-14 DIAGNOSIS — J9601 Acute respiratory failure with hypoxia: Secondary | ICD-10-CM | POA: Diagnosis not present

## 2024-01-14 DIAGNOSIS — E872 Acidosis, unspecified: Secondary | ICD-10-CM | POA: Diagnosis not present

## 2024-01-14 DIAGNOSIS — G9341 Metabolic encephalopathy: Secondary | ICD-10-CM | POA: Diagnosis not present

## 2024-01-14 DIAGNOSIS — Z7951 Long term (current) use of inhaled steroids: Secondary | ICD-10-CM | POA: Diagnosis not present

## 2024-01-14 DIAGNOSIS — M48062 Spinal stenosis, lumbar region with neurogenic claudication: Secondary | ICD-10-CM | POA: Diagnosis not present

## 2024-01-14 DIAGNOSIS — M4726 Other spondylosis with radiculopathy, lumbar region: Secondary | ICD-10-CM | POA: Diagnosis not present

## 2024-01-14 DIAGNOSIS — G894 Chronic pain syndrome: Secondary | ICD-10-CM | POA: Diagnosis not present

## 2024-01-14 DIAGNOSIS — Z7952 Long term (current) use of systemic steroids: Secondary | ICD-10-CM | POA: Diagnosis not present

## 2024-01-14 DIAGNOSIS — D509 Iron deficiency anemia, unspecified: Secondary | ICD-10-CM | POA: Diagnosis not present

## 2024-01-14 DIAGNOSIS — D631 Anemia in chronic kidney disease: Secondary | ICD-10-CM | POA: Diagnosis not present

## 2024-01-14 DIAGNOSIS — Z87891 Personal history of nicotine dependence: Secondary | ICD-10-CM | POA: Diagnosis not present

## 2024-01-14 DIAGNOSIS — I129 Hypertensive chronic kidney disease with stage 1 through stage 4 chronic kidney disease, or unspecified chronic kidney disease: Secondary | ICD-10-CM | POA: Diagnosis not present

## 2024-01-14 DIAGNOSIS — Z7982 Long term (current) use of aspirin: Secondary | ICD-10-CM | POA: Diagnosis not present

## 2024-01-14 DIAGNOSIS — J45909 Unspecified asthma, uncomplicated: Secondary | ICD-10-CM | POA: Diagnosis not present

## 2024-01-15 DIAGNOSIS — Z87891 Personal history of nicotine dependence: Secondary | ICD-10-CM | POA: Diagnosis not present

## 2024-01-15 DIAGNOSIS — G9341 Metabolic encephalopathy: Secondary | ICD-10-CM | POA: Diagnosis not present

## 2024-01-15 DIAGNOSIS — N1832 Chronic kidney disease, stage 3b: Secondary | ICD-10-CM | POA: Diagnosis not present

## 2024-01-15 DIAGNOSIS — Z7952 Long term (current) use of systemic steroids: Secondary | ICD-10-CM | POA: Diagnosis not present

## 2024-01-15 DIAGNOSIS — D509 Iron deficiency anemia, unspecified: Secondary | ICD-10-CM | POA: Diagnosis not present

## 2024-01-15 DIAGNOSIS — D631 Anemia in chronic kidney disease: Secondary | ICD-10-CM | POA: Diagnosis not present

## 2024-01-15 DIAGNOSIS — J9601 Acute respiratory failure with hypoxia: Secondary | ICD-10-CM | POA: Diagnosis not present

## 2024-01-15 DIAGNOSIS — I129 Hypertensive chronic kidney disease with stage 1 through stage 4 chronic kidney disease, or unspecified chronic kidney disease: Secondary | ICD-10-CM | POA: Diagnosis not present

## 2024-01-15 DIAGNOSIS — E872 Acidosis, unspecified: Secondary | ICD-10-CM | POA: Diagnosis not present

## 2024-01-15 DIAGNOSIS — J45909 Unspecified asthma, uncomplicated: Secondary | ICD-10-CM | POA: Diagnosis not present

## 2024-01-15 DIAGNOSIS — Z7982 Long term (current) use of aspirin: Secondary | ICD-10-CM | POA: Diagnosis not present

## 2024-01-15 DIAGNOSIS — M48062 Spinal stenosis, lumbar region with neurogenic claudication: Secondary | ICD-10-CM | POA: Diagnosis not present

## 2024-01-15 DIAGNOSIS — E782 Mixed hyperlipidemia: Secondary | ICD-10-CM | POA: Diagnosis not present

## 2024-01-15 DIAGNOSIS — Z7951 Long term (current) use of inhaled steroids: Secondary | ICD-10-CM | POA: Diagnosis not present

## 2024-01-15 DIAGNOSIS — E871 Hypo-osmolality and hyponatremia: Secondary | ICD-10-CM | POA: Diagnosis not present

## 2024-01-15 DIAGNOSIS — M4726 Other spondylosis with radiculopathy, lumbar region: Secondary | ICD-10-CM | POA: Diagnosis not present

## 2024-01-15 DIAGNOSIS — G894 Chronic pain syndrome: Secondary | ICD-10-CM | POA: Diagnosis not present

## 2024-01-22 DIAGNOSIS — E871 Hypo-osmolality and hyponatremia: Secondary | ICD-10-CM | POA: Diagnosis not present

## 2024-01-22 DIAGNOSIS — Z7952 Long term (current) use of systemic steroids: Secondary | ICD-10-CM | POA: Diagnosis not present

## 2024-01-22 DIAGNOSIS — Z7951 Long term (current) use of inhaled steroids: Secondary | ICD-10-CM | POA: Diagnosis not present

## 2024-01-22 DIAGNOSIS — Z87891 Personal history of nicotine dependence: Secondary | ICD-10-CM | POA: Diagnosis not present

## 2024-01-22 DIAGNOSIS — J9601 Acute respiratory failure with hypoxia: Secondary | ICD-10-CM | POA: Diagnosis not present

## 2024-01-22 DIAGNOSIS — N1832 Chronic kidney disease, stage 3b: Secondary | ICD-10-CM | POA: Diagnosis not present

## 2024-01-22 DIAGNOSIS — D631 Anemia in chronic kidney disease: Secondary | ICD-10-CM | POA: Diagnosis not present

## 2024-01-22 DIAGNOSIS — E872 Acidosis, unspecified: Secondary | ICD-10-CM | POA: Diagnosis not present

## 2024-01-22 DIAGNOSIS — E782 Mixed hyperlipidemia: Secondary | ICD-10-CM | POA: Diagnosis not present

## 2024-01-22 DIAGNOSIS — M48062 Spinal stenosis, lumbar region with neurogenic claudication: Secondary | ICD-10-CM | POA: Diagnosis not present

## 2024-01-22 DIAGNOSIS — J45909 Unspecified asthma, uncomplicated: Secondary | ICD-10-CM | POA: Diagnosis not present

## 2024-01-22 DIAGNOSIS — M4726 Other spondylosis with radiculopathy, lumbar region: Secondary | ICD-10-CM | POA: Diagnosis not present

## 2024-01-22 DIAGNOSIS — D509 Iron deficiency anemia, unspecified: Secondary | ICD-10-CM | POA: Diagnosis not present

## 2024-01-22 DIAGNOSIS — G9341 Metabolic encephalopathy: Secondary | ICD-10-CM | POA: Diagnosis not present

## 2024-01-22 DIAGNOSIS — G894 Chronic pain syndrome: Secondary | ICD-10-CM | POA: Diagnosis not present

## 2024-01-22 DIAGNOSIS — I129 Hypertensive chronic kidney disease with stage 1 through stage 4 chronic kidney disease, or unspecified chronic kidney disease: Secondary | ICD-10-CM | POA: Diagnosis not present

## 2024-01-22 DIAGNOSIS — Z7982 Long term (current) use of aspirin: Secondary | ICD-10-CM | POA: Diagnosis not present

## 2024-01-29 DIAGNOSIS — Z87891 Personal history of nicotine dependence: Secondary | ICD-10-CM | POA: Diagnosis not present

## 2024-01-29 DIAGNOSIS — D509 Iron deficiency anemia, unspecified: Secondary | ICD-10-CM | POA: Diagnosis not present

## 2024-01-29 DIAGNOSIS — G9341 Metabolic encephalopathy: Secondary | ICD-10-CM | POA: Diagnosis not present

## 2024-01-29 DIAGNOSIS — I129 Hypertensive chronic kidney disease with stage 1 through stage 4 chronic kidney disease, or unspecified chronic kidney disease: Secondary | ICD-10-CM | POA: Diagnosis not present

## 2024-01-29 DIAGNOSIS — E871 Hypo-osmolality and hyponatremia: Secondary | ICD-10-CM | POA: Diagnosis not present

## 2024-01-29 DIAGNOSIS — G894 Chronic pain syndrome: Secondary | ICD-10-CM | POA: Diagnosis not present

## 2024-01-29 DIAGNOSIS — J9601 Acute respiratory failure with hypoxia: Secondary | ICD-10-CM | POA: Diagnosis not present

## 2024-01-29 DIAGNOSIS — Z7982 Long term (current) use of aspirin: Secondary | ICD-10-CM | POA: Diagnosis not present

## 2024-01-29 DIAGNOSIS — J45909 Unspecified asthma, uncomplicated: Secondary | ICD-10-CM | POA: Diagnosis not present

## 2024-01-29 DIAGNOSIS — M48062 Spinal stenosis, lumbar region with neurogenic claudication: Secondary | ICD-10-CM | POA: Diagnosis not present

## 2024-01-29 DIAGNOSIS — E782 Mixed hyperlipidemia: Secondary | ICD-10-CM | POA: Diagnosis not present

## 2024-01-29 DIAGNOSIS — E872 Acidosis, unspecified: Secondary | ICD-10-CM | POA: Diagnosis not present

## 2024-01-29 DIAGNOSIS — N1832 Chronic kidney disease, stage 3b: Secondary | ICD-10-CM | POA: Diagnosis not present

## 2024-01-29 DIAGNOSIS — M4726 Other spondylosis with radiculopathy, lumbar region: Secondary | ICD-10-CM | POA: Diagnosis not present

## 2024-01-29 DIAGNOSIS — Z7951 Long term (current) use of inhaled steroids: Secondary | ICD-10-CM | POA: Diagnosis not present

## 2024-01-29 DIAGNOSIS — D631 Anemia in chronic kidney disease: Secondary | ICD-10-CM | POA: Diagnosis not present

## 2024-01-29 DIAGNOSIS — Z7952 Long term (current) use of systemic steroids: Secondary | ICD-10-CM | POA: Diagnosis not present

## 2024-02-01 DIAGNOSIS — J40 Bronchitis, not specified as acute or chronic: Secondary | ICD-10-CM | POA: Diagnosis not present

## 2024-02-01 DIAGNOSIS — J410 Simple chronic bronchitis: Secondary | ICD-10-CM | POA: Diagnosis not present

## 2024-02-03 DIAGNOSIS — D631 Anemia in chronic kidney disease: Secondary | ICD-10-CM | POA: Diagnosis not present

## 2024-02-03 DIAGNOSIS — J9601 Acute respiratory failure with hypoxia: Secondary | ICD-10-CM | POA: Diagnosis not present

## 2024-02-03 DIAGNOSIS — E782 Mixed hyperlipidemia: Secondary | ICD-10-CM | POA: Diagnosis not present

## 2024-02-03 DIAGNOSIS — Z7951 Long term (current) use of inhaled steroids: Secondary | ICD-10-CM | POA: Diagnosis not present

## 2024-02-03 DIAGNOSIS — Z7952 Long term (current) use of systemic steroids: Secondary | ICD-10-CM | POA: Diagnosis not present

## 2024-02-03 DIAGNOSIS — E872 Acidosis, unspecified: Secondary | ICD-10-CM | POA: Diagnosis not present

## 2024-02-03 DIAGNOSIS — G894 Chronic pain syndrome: Secondary | ICD-10-CM | POA: Diagnosis not present

## 2024-02-03 DIAGNOSIS — E871 Hypo-osmolality and hyponatremia: Secondary | ICD-10-CM | POA: Diagnosis not present

## 2024-02-03 DIAGNOSIS — M4726 Other spondylosis with radiculopathy, lumbar region: Secondary | ICD-10-CM | POA: Diagnosis not present

## 2024-02-03 DIAGNOSIS — M48062 Spinal stenosis, lumbar region with neurogenic claudication: Secondary | ICD-10-CM | POA: Diagnosis not present

## 2024-02-03 DIAGNOSIS — Z87891 Personal history of nicotine dependence: Secondary | ICD-10-CM | POA: Diagnosis not present

## 2024-02-03 DIAGNOSIS — Z7982 Long term (current) use of aspirin: Secondary | ICD-10-CM | POA: Diagnosis not present

## 2024-02-03 DIAGNOSIS — D509 Iron deficiency anemia, unspecified: Secondary | ICD-10-CM | POA: Diagnosis not present

## 2024-02-03 DIAGNOSIS — I129 Hypertensive chronic kidney disease with stage 1 through stage 4 chronic kidney disease, or unspecified chronic kidney disease: Secondary | ICD-10-CM | POA: Diagnosis not present

## 2024-02-03 DIAGNOSIS — N1832 Chronic kidney disease, stage 3b: Secondary | ICD-10-CM | POA: Diagnosis not present

## 2024-02-03 DIAGNOSIS — G9341 Metabolic encephalopathy: Secondary | ICD-10-CM | POA: Diagnosis not present

## 2024-02-03 DIAGNOSIS — J45909 Unspecified asthma, uncomplicated: Secondary | ICD-10-CM | POA: Diagnosis not present

## 2024-02-15 DIAGNOSIS — E782 Mixed hyperlipidemia: Secondary | ICD-10-CM | POA: Diagnosis not present

## 2024-02-15 DIAGNOSIS — E872 Acidosis, unspecified: Secondary | ICD-10-CM | POA: Diagnosis not present

## 2024-02-15 DIAGNOSIS — I129 Hypertensive chronic kidney disease with stage 1 through stage 4 chronic kidney disease, or unspecified chronic kidney disease: Secondary | ICD-10-CM | POA: Diagnosis not present

## 2024-02-15 DIAGNOSIS — D509 Iron deficiency anemia, unspecified: Secondary | ICD-10-CM | POA: Diagnosis not present

## 2024-02-15 DIAGNOSIS — G894 Chronic pain syndrome: Secondary | ICD-10-CM | POA: Diagnosis not present

## 2024-02-15 DIAGNOSIS — Z7952 Long term (current) use of systemic steroids: Secondary | ICD-10-CM | POA: Diagnosis not present

## 2024-02-15 DIAGNOSIS — M48062 Spinal stenosis, lumbar region with neurogenic claudication: Secondary | ICD-10-CM | POA: Diagnosis not present

## 2024-02-15 DIAGNOSIS — Z7951 Long term (current) use of inhaled steroids: Secondary | ICD-10-CM | POA: Diagnosis not present

## 2024-02-15 DIAGNOSIS — Z87891 Personal history of nicotine dependence: Secondary | ICD-10-CM | POA: Diagnosis not present

## 2024-02-15 DIAGNOSIS — E871 Hypo-osmolality and hyponatremia: Secondary | ICD-10-CM | POA: Diagnosis not present

## 2024-02-15 DIAGNOSIS — M4726 Other spondylosis with radiculopathy, lumbar region: Secondary | ICD-10-CM | POA: Diagnosis not present

## 2024-02-15 DIAGNOSIS — J9601 Acute respiratory failure with hypoxia: Secondary | ICD-10-CM | POA: Diagnosis not present

## 2024-02-15 DIAGNOSIS — D631 Anemia in chronic kidney disease: Secondary | ICD-10-CM | POA: Diagnosis not present

## 2024-02-15 DIAGNOSIS — J45909 Unspecified asthma, uncomplicated: Secondary | ICD-10-CM | POA: Diagnosis not present

## 2024-02-15 DIAGNOSIS — G9341 Metabolic encephalopathy: Secondary | ICD-10-CM | POA: Diagnosis not present

## 2024-02-15 DIAGNOSIS — Z7982 Long term (current) use of aspirin: Secondary | ICD-10-CM | POA: Diagnosis not present

## 2024-02-15 DIAGNOSIS — N1832 Chronic kidney disease, stage 3b: Secondary | ICD-10-CM | POA: Diagnosis not present

## 2024-02-23 DIAGNOSIS — J45909 Unspecified asthma, uncomplicated: Secondary | ICD-10-CM | POA: Diagnosis not present

## 2024-02-23 DIAGNOSIS — E872 Acidosis, unspecified: Secondary | ICD-10-CM | POA: Diagnosis not present

## 2024-02-23 DIAGNOSIS — Z7982 Long term (current) use of aspirin: Secondary | ICD-10-CM | POA: Diagnosis not present

## 2024-02-23 DIAGNOSIS — J9601 Acute respiratory failure with hypoxia: Secondary | ICD-10-CM | POA: Diagnosis not present

## 2024-02-23 DIAGNOSIS — D509 Iron deficiency anemia, unspecified: Secondary | ICD-10-CM | POA: Diagnosis not present

## 2024-02-23 DIAGNOSIS — G894 Chronic pain syndrome: Secondary | ICD-10-CM | POA: Diagnosis not present

## 2024-02-23 DIAGNOSIS — M48062 Spinal stenosis, lumbar region with neurogenic claudication: Secondary | ICD-10-CM | POA: Diagnosis not present

## 2024-02-23 DIAGNOSIS — Z87891 Personal history of nicotine dependence: Secondary | ICD-10-CM | POA: Diagnosis not present

## 2024-02-23 DIAGNOSIS — G9341 Metabolic encephalopathy: Secondary | ICD-10-CM | POA: Diagnosis not present

## 2024-02-23 DIAGNOSIS — Z7951 Long term (current) use of inhaled steroids: Secondary | ICD-10-CM | POA: Diagnosis not present

## 2024-02-23 DIAGNOSIS — I129 Hypertensive chronic kidney disease with stage 1 through stage 4 chronic kidney disease, or unspecified chronic kidney disease: Secondary | ICD-10-CM | POA: Diagnosis not present

## 2024-02-23 DIAGNOSIS — N1832 Chronic kidney disease, stage 3b: Secondary | ICD-10-CM | POA: Diagnosis not present

## 2024-02-23 DIAGNOSIS — E871 Hypo-osmolality and hyponatremia: Secondary | ICD-10-CM | POA: Diagnosis not present

## 2024-02-23 DIAGNOSIS — D631 Anemia in chronic kidney disease: Secondary | ICD-10-CM | POA: Diagnosis not present

## 2024-02-23 DIAGNOSIS — Z7952 Long term (current) use of systemic steroids: Secondary | ICD-10-CM | POA: Diagnosis not present

## 2024-02-23 DIAGNOSIS — M4726 Other spondylosis with radiculopathy, lumbar region: Secondary | ICD-10-CM | POA: Diagnosis not present

## 2024-02-23 DIAGNOSIS — E782 Mixed hyperlipidemia: Secondary | ICD-10-CM | POA: Diagnosis not present

## 2024-03-02 DIAGNOSIS — N1832 Chronic kidney disease, stage 3b: Secondary | ICD-10-CM | POA: Diagnosis not present

## 2024-03-02 DIAGNOSIS — D509 Iron deficiency anemia, unspecified: Secondary | ICD-10-CM | POA: Diagnosis not present

## 2024-03-02 DIAGNOSIS — I129 Hypertensive chronic kidney disease with stage 1 through stage 4 chronic kidney disease, or unspecified chronic kidney disease: Secondary | ICD-10-CM | POA: Diagnosis not present

## 2024-03-02 DIAGNOSIS — Z7982 Long term (current) use of aspirin: Secondary | ICD-10-CM | POA: Diagnosis not present

## 2024-03-02 DIAGNOSIS — Z87891 Personal history of nicotine dependence: Secondary | ICD-10-CM | POA: Diagnosis not present

## 2024-03-02 DIAGNOSIS — G894 Chronic pain syndrome: Secondary | ICD-10-CM | POA: Diagnosis not present

## 2024-03-02 DIAGNOSIS — M48062 Spinal stenosis, lumbar region with neurogenic claudication: Secondary | ICD-10-CM | POA: Diagnosis not present

## 2024-03-02 DIAGNOSIS — J45909 Unspecified asthma, uncomplicated: Secondary | ICD-10-CM | POA: Diagnosis not present

## 2024-03-02 DIAGNOSIS — M4726 Other spondylosis with radiculopathy, lumbar region: Secondary | ICD-10-CM | POA: Diagnosis not present

## 2024-03-02 DIAGNOSIS — E782 Mixed hyperlipidemia: Secondary | ICD-10-CM | POA: Diagnosis not present

## 2024-03-02 DIAGNOSIS — D631 Anemia in chronic kidney disease: Secondary | ICD-10-CM | POA: Diagnosis not present

## 2024-03-02 DIAGNOSIS — E872 Acidosis, unspecified: Secondary | ICD-10-CM | POA: Diagnosis not present

## 2024-03-02 DIAGNOSIS — J9601 Acute respiratory failure with hypoxia: Secondary | ICD-10-CM | POA: Diagnosis not present

## 2024-03-02 DIAGNOSIS — E871 Hypo-osmolality and hyponatremia: Secondary | ICD-10-CM | POA: Diagnosis not present

## 2024-03-02 DIAGNOSIS — G9341 Metabolic encephalopathy: Secondary | ICD-10-CM | POA: Diagnosis not present

## 2024-03-02 DIAGNOSIS — Z7951 Long term (current) use of inhaled steroids: Secondary | ICD-10-CM | POA: Diagnosis not present

## 2024-03-02 DIAGNOSIS — Z7952 Long term (current) use of systemic steroids: Secondary | ICD-10-CM | POA: Diagnosis not present

## 2024-03-03 DIAGNOSIS — J40 Bronchitis, not specified as acute or chronic: Secondary | ICD-10-CM | POA: Diagnosis not present

## 2024-03-03 DIAGNOSIS — J410 Simple chronic bronchitis: Secondary | ICD-10-CM | POA: Diagnosis not present

## 2024-04-02 DIAGNOSIS — J410 Simple chronic bronchitis: Secondary | ICD-10-CM | POA: Diagnosis not present

## 2024-04-02 DIAGNOSIS — J40 Bronchitis, not specified as acute or chronic: Secondary | ICD-10-CM | POA: Diagnosis not present

## 2024-04-11 DIAGNOSIS — Z79899 Other long term (current) drug therapy: Secondary | ICD-10-CM | POA: Diagnosis not present

## 2024-04-11 DIAGNOSIS — E559 Vitamin D deficiency, unspecified: Secondary | ICD-10-CM | POA: Diagnosis not present

## 2024-04-19 DIAGNOSIS — M810 Age-related osteoporosis without current pathological fracture: Secondary | ICD-10-CM | POA: Diagnosis not present

## 2024-05-02 DIAGNOSIS — Z9181 History of falling: Secondary | ICD-10-CM | POA: Diagnosis not present

## 2024-05-02 DIAGNOSIS — M1990 Unspecified osteoarthritis, unspecified site: Secondary | ICD-10-CM | POA: Diagnosis not present

## 2024-05-02 DIAGNOSIS — Z79899 Other long term (current) drug therapy: Secondary | ICD-10-CM | POA: Diagnosis not present

## 2024-05-02 DIAGNOSIS — M25511 Pain in right shoulder: Secondary | ICD-10-CM | POA: Diagnosis not present

## 2024-05-02 DIAGNOSIS — N1832 Chronic kidney disease, stage 3b: Secondary | ICD-10-CM | POA: Diagnosis not present

## 2024-05-02 DIAGNOSIS — K580 Irritable bowel syndrome with diarrhea: Secondary | ICD-10-CM | POA: Diagnosis not present

## 2024-05-02 DIAGNOSIS — I10 Essential (primary) hypertension: Secondary | ICD-10-CM | POA: Diagnosis not present

## 2024-05-02 DIAGNOSIS — E559 Vitamin D deficiency, unspecified: Secondary | ICD-10-CM | POA: Diagnosis not present

## 2024-05-02 DIAGNOSIS — E7800 Pure hypercholesterolemia, unspecified: Secondary | ICD-10-CM | POA: Diagnosis not present

## 2024-05-02 DIAGNOSIS — M25512 Pain in left shoulder: Secondary | ICD-10-CM | POA: Diagnosis not present

## 2024-05-02 DIAGNOSIS — M810 Age-related osteoporosis without current pathological fracture: Secondary | ICD-10-CM | POA: Diagnosis not present

## 2024-05-03 DIAGNOSIS — J410 Simple chronic bronchitis: Secondary | ICD-10-CM | POA: Diagnosis not present

## 2024-05-03 DIAGNOSIS — J40 Bronchitis, not specified as acute or chronic: Secondary | ICD-10-CM | POA: Diagnosis not present

## 2024-05-16 DIAGNOSIS — E875 Hyperkalemia: Secondary | ICD-10-CM | POA: Diagnosis not present

## 2024-05-23 DIAGNOSIS — E2839 Other primary ovarian failure: Secondary | ICD-10-CM | POA: Diagnosis not present

## 2024-06-02 DIAGNOSIS — J40 Bronchitis, not specified as acute or chronic: Secondary | ICD-10-CM | POA: Diagnosis not present

## 2024-06-02 DIAGNOSIS — J410 Simple chronic bronchitis: Secondary | ICD-10-CM | POA: Diagnosis not present

## 2024-07-03 DIAGNOSIS — J410 Simple chronic bronchitis: Secondary | ICD-10-CM | POA: Diagnosis not present

## 2024-08-03 DIAGNOSIS — J40 Bronchitis, not specified as acute or chronic: Secondary | ICD-10-CM | POA: Diagnosis not present

## 2024-08-03 DIAGNOSIS — J410 Simple chronic bronchitis: Secondary | ICD-10-CM | POA: Diagnosis not present

## 2024-09-02 DIAGNOSIS — J410 Simple chronic bronchitis: Secondary | ICD-10-CM | POA: Diagnosis not present

## 2024-09-02 DIAGNOSIS — J40 Bronchitis, not specified as acute or chronic: Secondary | ICD-10-CM | POA: Diagnosis not present
